# Patient Record
Sex: Female | Born: 1990 | Race: Black or African American | Hispanic: No | Marital: Single | Smoking: Former smoker
Health system: Southern US, Community
[De-identification: ages and names within clinical notes are randomized; demographics above are authoritative.]

## PROBLEM LIST (undated history)

## (undated) ENCOUNTER — Inpatient Hospital Stay: Payer: Self-pay

## (undated) DIAGNOSIS — D573 Sickle-cell trait: Secondary | ICD-10-CM

## (undated) DIAGNOSIS — G971 Other reaction to spinal and lumbar puncture: Secondary | ICD-10-CM

## (undated) DIAGNOSIS — K8020 Calculus of gallbladder without cholecystitis without obstruction: Secondary | ICD-10-CM

## (undated) DIAGNOSIS — F99 Mental disorder, not otherwise specified: Secondary | ICD-10-CM

## (undated) DIAGNOSIS — O2340 Unspecified infection of urinary tract in pregnancy, unspecified trimester: Secondary | ICD-10-CM

## (undated) DIAGNOSIS — B009 Herpesviral infection, unspecified: Secondary | ICD-10-CM

## (undated) DIAGNOSIS — A599 Trichomoniasis, unspecified: Secondary | ICD-10-CM

## (undated) HISTORY — DX: Trichomoniasis, unspecified: A59.9

## (undated) HISTORY — PX: NO PAST SURGERIES: SHX2092

## (undated) HISTORY — PX: WISDOM TOOTH EXTRACTION: SHX21

## (undated) HISTORY — PX: CHOLECYSTECTOMY: SHX55

## (undated) HISTORY — PX: OTHER SURGICAL HISTORY: SHX169

---
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
## 2011-04-01 ENCOUNTER — Emergency Department: Payer: Self-pay | Admitting: Unknown Physician Specialty

## 2011-04-01 LAB — COMPREHENSIVE METABOLIC PANEL
Albumin: 3.9 g/dL (ref 3.4–5.0)
Alkaline Phosphatase: 69 U/L (ref 50–136)
Anion Gap: 10 (ref 7–16)
BUN: 7 mg/dL (ref 7–18)
Bilirubin,Total: 0.6 mg/dL (ref 0.2–1.0)
Calcium, Total: 9 mg/dL (ref 8.5–10.1)
Creatinine: 0.57 mg/dL — ABNORMAL LOW (ref 0.60–1.30)
Glucose: 85 mg/dL (ref 65–99)
Osmolality: 280 (ref 275–301)
Potassium: 3.8 mmol/L (ref 3.5–5.1)
SGPT (ALT): 22 U/L
Sodium: 142 mmol/L (ref 136–145)
Total Protein: 7.7 g/dL (ref 6.4–8.2)

## 2011-04-01 LAB — CBC
HCT: 40.7 % (ref 35.0–47.0)
HGB: 13.8 g/dL (ref 12.0–16.0)
MCH: 27.5 pg (ref 26.0–34.0)
MCV: 81 fL (ref 80–100)
Platelet: 303 10*3/uL (ref 150–440)
RDW: 13.3 % (ref 11.5–14.5)
WBC: 5.7 10*3/uL (ref 3.6–11.0)

## 2011-04-01 LAB — URINALYSIS, COMPLETE
Blood: NEGATIVE
Nitrite: NEGATIVE
Protein: NEGATIVE
Squamous Epithelial: 3
WBC UR: 20 /HPF (ref 0–5)

## 2012-05-12 ENCOUNTER — Emergency Department: Payer: Self-pay | Admitting: Emergency Medicine

## 2012-09-23 ENCOUNTER — Emergency Department: Payer: Self-pay | Admitting: Emergency Medicine

## 2012-09-23 LAB — WET PREP, GENITAL

## 2014-07-11 ENCOUNTER — Other Ambulatory Visit: Payer: Self-pay | Admitting: Advanced Practice Midwife

## 2014-07-11 DIAGNOSIS — Z3401 Encounter for supervision of normal first pregnancy, first trimester: Secondary | ICD-10-CM

## 2014-07-11 LAB — OB RESULTS CONSOLE HIV ANTIBODY (ROUTINE TESTING): HIV: NONREACTIVE

## 2014-07-12 LAB — OB RESULTS CONSOLE GC/CHLAMYDIA
CHLAMYDIA, DNA PROBE: NEGATIVE
GC PROBE AMP, GENITAL: NEGATIVE

## 2014-07-12 LAB — OB RESULTS CONSOLE VARICELLA ZOSTER ANTIBODY, IGG: Varicella: IMMUNE

## 2014-07-12 LAB — OB RESULTS CONSOLE HEPATITIS B SURFACE ANTIGEN: Hepatitis B Surface Ag: NEGATIVE

## 2014-07-12 LAB — OB RESULTS CONSOLE ABO/RH: RH TYPE: POSITIVE

## 2014-07-12 LAB — OB RESULTS CONSOLE RPR: RPR: NONREACTIVE

## 2014-07-17 ENCOUNTER — Ambulatory Visit
Admission: RE | Admit: 2014-07-17 | Discharge: 2014-07-17 | Disposition: A | Discharge status: Home / Self Care | Payer: Medicaid Other | Source: Ambulatory Visit | Attending: Advanced Practice Midwife | Admitting: Advanced Practice Midwife

## 2014-07-17 DIAGNOSIS — Z36 Encounter for antenatal screening of mother: Secondary | ICD-10-CM | POA: Insufficient documentation

## 2014-07-17 DIAGNOSIS — Z3401 Encounter for supervision of normal first pregnancy, first trimester: Secondary | ICD-10-CM

## 2014-08-13 ENCOUNTER — Emergency Department
Admission: EM | Admit: 2014-08-13 | Discharge: 2014-08-13 | Disposition: A | Discharge status: Home / Self Care | Payer: Medicaid Other | Attending: Emergency Medicine | Admitting: Emergency Medicine

## 2014-08-13 ENCOUNTER — Encounter: Payer: Self-pay | Admitting: *Deleted

## 2014-08-13 DIAGNOSIS — M436 Torticollis: Secondary | ICD-10-CM

## 2014-08-13 DIAGNOSIS — O9989 Other specified diseases and conditions complicating pregnancy, childbirth and the puerperium: Secondary | ICD-10-CM | POA: Diagnosis not present

## 2014-08-13 DIAGNOSIS — Z3A13 13 weeks gestation of pregnancy: Secondary | ICD-10-CM | POA: Diagnosis not present

## 2014-08-13 MED ORDER — CYCLOBENZAPRINE HCL 10 MG PO TABS: 10.0000 mg | ORAL_TABLET | Freq: Three times a day (TID) | ORAL | Status: DC | PRN

## 2014-08-13 MED ORDER — ACETAMINOPHEN 500 MG PO TABS
1000.0000 mg | ORAL_TABLET | Freq: Once | ORAL | Status: AC
  Administered 2014-08-13: 1000 mg via ORAL
  Filled 2014-08-13: qty 2

## 2014-08-13 MED ORDER — CYCLOBENZAPRINE HCL 10 MG PO TABS
10.0000 mg | ORAL_TABLET | Freq: Once | ORAL | Status: AC
  Administered 2014-08-13: 10 mg via ORAL
  Filled 2014-08-13: qty 1

## 2014-08-13 NOTE — ED Provider Notes (Signed)
[facility] Emergency Department Provider Note ____________________________________________  Time seen: Approximately 5:46 PM  I have reviewed the triage vital signs and the nursing notes.   HISTORY  Chief Complaint Neck Pain   HPI Katelyn Mendoza is a 24 y.o. female who presents to the emergency department for evaluation of left-sided neck pain. She states that she slept in a bed that was partially elevated and awakened with pain in the left neck and shoulder area. She is a little over [redacted] weeks pregnant and was scared to take any current medications. She denies injury.   History reviewed. No pertinent past medical history.  There are no active problems to display for this patient.   History reviewed. No pertinent past surgical history.  Current Outpatient Rx  Name  Route  Sig  Dispense  Refill  . cyclobenzaprine (FLEXERIL) 10 MG tablet   Oral   Take 1 tablet (10 mg total) by mouth 3 (three) times daily as needed for muscle spasms.   30 tablet   0     Allergies Review of patient's allergies indicates no known allergies.  No family history on file.  Social History History  Substance Use Topics  . Smoking status: Never Smoker   . Smokeless tobacco: Not on file  . Alcohol Use: No    Review of Systems Constitutional: No recent illness. Eyes: No visual changes. ENT: No sore throat. Cardiovascular: Denies chest pain or palpitations. Respiratory: Denies shortness of breath. Gastrointestinal: No abdominal pain.  Genitourinary: Negative for dysuria. Musculoskeletal: Pain in neck, left side. Skin: Negative for rash. Neurological: Negative for headaches, focal weakness or numbness. 10-point ROS otherwise negative.  ____________________________________________   PHYSICAL EXAM:  VITAL SIGNS: ED Triage Vitals  Enc Vitals Group     BP 08/13/14 1632 104/56 mmHg     Pulse Rate 08/13/14 1632 71     Resp 08/13/14 1632 20     Temp 08/13/14  1632 97.7 F (36.5 C)     Temp Source 08/13/14 1632 Oral     SpO2 08/13/14 1632 100 %     Weight 08/13/14 1632 142 lb (64.411 kg)     Height 08/13/14 1632  (1.549 m)     Head Cir --      Peak Flow --      Pain Score 08/13/14 1634 10     Pain Loc --      Pain Edu? --      Excl. in GC? --     Constitutional: Alert and oriented. Well appearing and in no acute distress. Eyes: Conjunctivae are normal. EOMI. Head: Atraumatic. Nose: No congestion/rhinnorhea. Neck: No stridor.  Respiratory: Normal respiratory effort.   Musculoskeletal: Left neck with palpable spasm radiating into the left shoulder. Neurologic:  Normal speech and language. No gross focal neurologic deficits are appreciated. Speech is normal. No gait instability. Skin:  Skin is warm, dry and intact. Atraumatic. Psychiatric: Mood and affect are normal. Speech and behavior are normal.  ____________________________________________   LABS (all labs ordered are listed, but only abnormal results are displayed)  Labs Reviewed - No data to display ____________________________________________  RADIOLOGY  Not indicated ____________________________________________   PROCEDURES  Procedure(s) performed: None   ____________________________________________   INITIAL IMPRESSION / ASSESSMENT AND PLAN / ED COURSE  Pertinent labs & imaging results that were available during my care of the patient were [facility] (see chart for details).  Patient was advised to  take the Flexeril as needed. She was advised not to take NSAIDs, but she was advised that she could take Tylenol as needed for pain. She was advised to follow-up with her OB/GYN for symptoms that are not improving over the next 2-3 days. ____________________________________________   FINAL CLINICAL IMPRESSION(S) / ED DIAGNOSES  Final diagnoses:  Torticollis, acute      Chinita Pester, FNP 08/13/14  1748  Governor Rooks, MD 08/13/14 2201

## 2014-08-13 NOTE — ED Notes (Signed)
Pt reports a sharp pain in her left neck and back that started yesterday morning when she woke up. Pain worse with trying to look to the side, no meds prior to arrival.

## 2014-08-13 NOTE — ED Notes (Signed)
States she woke up with pain to neck this am  Denies any injury  Provider in room on arrival

## 2014-10-13 ENCOUNTER — Encounter (HOSPITAL_COMMUNITY): Payer: Self-pay | Admitting: Emergency Medicine

## 2014-10-13 ENCOUNTER — Emergency Department (HOSPITAL_COMMUNITY)
Admission: EM | Admit: 2014-10-13 | Discharge: 2014-10-13 | Disposition: A | Discharge status: Home / Self Care | Payer: Medicaid Other | Attending: Emergency Medicine | Admitting: Emergency Medicine

## 2014-10-13 DIAGNOSIS — R1110 Vomiting, unspecified: Secondary | ICD-10-CM | POA: Diagnosis not present

## 2014-10-13 DIAGNOSIS — N390 Urinary tract infection, site not specified: Secondary | ICD-10-CM

## 2014-10-13 DIAGNOSIS — Z8659 Personal history of other mental and behavioral disorders: Secondary | ICD-10-CM | POA: Diagnosis not present

## 2014-10-13 DIAGNOSIS — Z862 Personal history of diseases of the blood and blood-forming organs and certain disorders involving the immune mechanism: Secondary | ICD-10-CM | POA: Diagnosis not present

## 2014-10-13 DIAGNOSIS — R197 Diarrhea, unspecified: Secondary | ICD-10-CM | POA: Insufficient documentation

## 2014-10-13 DIAGNOSIS — Z3490 Encounter for supervision of normal pregnancy, unspecified, unspecified trimester: Secondary | ICD-10-CM

## 2014-10-13 DIAGNOSIS — O9989 Other specified diseases and conditions complicating pregnancy, childbirth and the puerperium: Secondary | ICD-10-CM | POA: Diagnosis not present

## 2014-10-13 DIAGNOSIS — O2392 Unspecified genitourinary tract infection in pregnancy, second trimester: Secondary | ICD-10-CM | POA: Diagnosis not present

## 2014-10-13 HISTORY — DX: Sickle-cell trait: D57.3

## 2014-10-13 HISTORY — DX: Mental disorder, not otherwise specified: F99

## 2014-10-13 LAB — URINE MICROSCOPIC-ADD ON

## 2014-10-13 LAB — URINALYSIS, ROUTINE W REFLEX MICROSCOPIC
BILIRUBIN URINE: NEGATIVE
Glucose, UA: NEGATIVE mg/dL
KETONES UR: 15 mg/dL — AB
NITRITE: POSITIVE — AB
PH: 6 (ref 5.0–8.0)
PROTEIN: 30 mg/dL — AB
Specific Gravity, Urine: 1.012 (ref 1.005–1.030)
Urobilinogen, UA: 1 mg/dL (ref 0.0–1.0)

## 2014-10-13 LAB — CBC
HCT: 32.4 % — ABNORMAL LOW (ref 36.0–46.0)
Hemoglobin: 11 g/dL — ABNORMAL LOW (ref 12.0–15.0)
MCH: 26.8 pg (ref 26.0–34.0)
MCHC: 34 g/dL (ref 30.0–36.0)
MCV: 79 fL (ref 78.0–100.0)
PLATELETS: 254 10*3/uL (ref 150–400)
RBC: 4.1 MIL/uL (ref 3.87–5.11)
RDW: 13.3 % (ref 11.5–15.5)
WBC: 14.2 10*3/uL — AB (ref 4.0–10.5)

## 2014-10-13 LAB — WET PREP, GENITAL
Clue Cells Wet Prep HPF POC: NONE SEEN
TRICH WET PREP: NONE SEEN
Yeast Wet Prep HPF POC: NONE SEEN

## 2014-10-13 LAB — COMPREHENSIVE METABOLIC PANEL
ALT: 11 U/L — AB (ref 14–54)
AST: 16 U/L (ref 15–41)
Albumin: 2.8 g/dL — ABNORMAL LOW (ref 3.5–5.0)
Alkaline Phosphatase: 73 U/L (ref 38–126)
Anion gap: 9 (ref 5–15)
BUN: <5 mg/dL — ABNORMAL LOW (ref 6–20)
CHLORIDE: 101 mmol/L (ref 101–111)
CO2: 21 mmol/L — ABNORMAL LOW (ref 22–32)
CREATININE: 0.63 mg/dL (ref 0.44–1.00)
Calcium: 8.6 mg/dL — ABNORMAL LOW (ref 8.9–10.3)
GFR calc non Af Amer: >60 mL/min (ref >60)
Glucose, Bld: 98 mg/dL (ref 65–99)
Potassium: 3.6 mmol/L (ref 3.5–5.1)
Sodium: 131 mmol/L — ABNORMAL LOW (ref 135–145)
Total Bilirubin: 0.9 mg/dL (ref 0.3–1.2)
Total Protein: 6.4 g/dL — ABNORMAL LOW (ref 6.5–8.1)

## 2014-10-13 LAB — LIPASE, BLOOD: LIPASE: 17 U/L — AB (ref 22–51)

## 2014-10-13 MED ORDER — CEPHALEXIN 500 MG PO CAPS: 500.0000 mg | ORAL_CAPSULE | Freq: Three times a day (TID) | ORAL | Status: DC

## 2014-10-13 MED ORDER — SODIUM CHLORIDE 0.9 % IV BOLUS (SEPSIS)
1000.0000 mL | Freq: Once | INTRAVENOUS | Status: AC
  Administered 2014-10-13: 1000 mL via INTRAVENOUS

## 2014-10-13 MED ORDER — DEXTROSE 5 % IV SOLN
1.0000 g | Freq: Once | INTRAVENOUS | Status: AC
  Administered 2014-10-13: 1 g via INTRAVENOUS
  Filled 2014-10-13: qty 10

## 2014-10-13 MED ORDER — ONDANSETRON 4 MG PO TBDP: ORAL_TABLET | ORAL | Status: DC

## 2014-10-13 NOTE — Progress Notes (Signed)
Dr Shawnie Pons called and made aware of patient complaints, gestation, and fetal heart rate tracing (no ctx noted and reactive fetal heart rate); orders given to remove from monitor and clear obstetrically.

## 2014-10-13 NOTE — Progress Notes (Signed)
RROB called from [facility] for a pregnant patient coming in to be seen for complaints of nausea and vomiting x1 week but getting worse over the last 2 days; patient also states she has a low grade fever and some cramping pain in her lower back and right side and abdomen that remains constant and worsens with activity; patient rates pain 10/10 at this time; she is a G2P0 that is 25 and 3/[redacted] weeks along in her pregnancy at this time; patient states she plans to delivery at [facility]. She states positive fetal movement and denies bleeding or leaking of fluid. She does report some mucus white discharge at this time. EFM in place at this time. Patient is receiving fluid bolus and labs are in process.

## 2014-10-13 NOTE — ED Provider Notes (Signed)
CSN: [id number]     Arrival date & time 10/13/14  0127 History   First MD Initiated Contact with Patient 10/13/14 0410     Chief Complaint  Patient presents with  . Diarrhea  . Emesis     (Consider location/radiation/quality/duration/timing/severity/associated sxs/prior Treatment) HPI Patient presents with several days of vomiting and diarrhea. States she's had 3 episodes of each daily. No blood in either. Patient also complains of urinary frequency. Denies any flank pain. Patient denies pelvic pain, vaginal bleeding. She has had mild white vaginal discharge. Patient states that she is 5/2 months pregnant. Past Medical History  Diagnosis Date  . Sickle cell trait (HCC)   . Mental disorder     bipolar disorder dx'd at age 69   Past Surgical History  Procedure Laterality Date  . No past surgeries     No family history on file. Social History  Substance Use Topics  . Smoking status: Never Smoker   . Smokeless tobacco: None  . Alcohol Use: No   OB History    Gravida Para Term Preterm AB TAB SAB Ectopic Multiple Living   1              Review of Systems  Constitutional: Positive for fever and fatigue. Negative for chills.  Respiratory: Negative for cough and shortness of breath.   Cardiovascular: Negative for chest pain.  Gastrointestinal: Positive for nausea, vomiting and diarrhea. Negative for abdominal pain and blood in stool.  Genitourinary: Positive for frequency. Negative for dysuria, flank pain, vaginal bleeding, vaginal discharge and pelvic pain.  Musculoskeletal: Positive for myalgias and back pain. Negative for neck pain and neck stiffness.  Skin: Negative for rash and wound.  Neurological: Negative for dizziness, weakness, light-headedness, numbness and headaches.  All other systems reviewed and are negative.     Allergies  Review of patient's allergies indicates no known allergies.  Home Medications   Prior to Admission medications   Medication Sig Start  Date End Date Taking? Authorizing Provider  Prenatal Vit-Fe Fumarate-FA (PRENATAL MULTIVITAMIN) TABS tablet Take 1 tablet by mouth daily at 12 noon.   Yes Historical Provider, MD  cephALEXin (KEFLEX) 500 MG capsule Take 1 capsule (500 mg total) by mouth 3 (three) times daily. 10/13/14   Loren Racer, MD  ondansetron (ZOFRAN ODT) 4 MG disintegrating tablet  ODT q4 hours prn nausea/vomit 10/13/14   Loren Racer, MD   BP 90/42 mmHg  Pulse 94  Temp(Src) 99 F (37.2 C) (Oral)  Resp 26  Wt 151 lb 11.2 oz (68.811 kg)  SpO2 98%  LMP 05/08/2014 (Exact Date) Physical Exam  Constitutional: She is oriented to person, place, and time. She appears well-developed and well-nourished. No distress.  HENT:  Head: Normocephalic and atraumatic.  Mouth/Throat: Oropharynx is clear and moist. No oropharyngeal exudate.  Eyes: EOM are normal. Pupils are equal, round, and reactive to light.  Neck: Normal range of motion. Neck supple.  No meningismus  Cardiovascular: Normal rate and regular rhythm.   Pulmonary/Chest: Effort normal and breath sounds normal. No respiratory distress. She has no wheezes. She has no rales. She exhibits no tenderness.  Abdominal: Soft. Bowel sounds are normal. She exhibits no distension and no mass. There is no tenderness. There is no rebound and no guarding.  Gravid abdomen with fundus at roughly the umbilicus.  Genitourinary: Vaginal discharge (white vaginal discharge) found.  No cervical motion tenderness. No fundal or adnexal tenderness.  Musculoskeletal: Normal range of motion. She exhibits no edema or tenderness.  No CVA tenderness bilaterally. Mild inferior lumbar paraspinal tenderness. No midline thoracic or lumbar tenderness.  Neurological: She is alert and oriented to person, place, and time.  Moves all extremities without deficit. Sensation is grossly intact.  Skin: Skin is warm and dry. No rash noted. No erythema.  Psychiatric: She has a normal mood and affect. Her  behavior is normal.  Nursing note and vitals reviewed.   ED Course  Procedures (including critical care time) Labs Review Labs Reviewed  WET PREP, GENITAL - Abnormal; Notable for the following:    WBC, Wet Prep HPF POC FEW (*)    All other components within normal limits  LIPASE, BLOOD - Abnormal; Notable for the following:    Lipase 17 (*)    All other components within normal limits  COMPREHENSIVE METABOLIC PANEL - Abnormal; Notable for the following:    Sodium 131 (*)    CO2 21 (*)    BUN <5 (*)    Calcium 8.6 (*)    Total Protein 6.4 (*)    Albumin 2.8 (*)    ALT 11 (*)    All other components within normal limits  CBC - Abnormal; Notable for the following:    WBC 14.2 (*)    Hemoglobin 11.0 (*)    HCT 32.4 (*)    All other components within normal limits  URINALYSIS, ROUTINE W REFLEX MICROSCOPIC (NOT AT [facility]) - Abnormal; Notable for the following:    Color, Urine AMBER (*)    APPearance TURBID (*)    Hgb urine dipstick SMALL (*)    Ketones, ur 15 (*)    Protein, ur 30 (*)    Nitrite POSITIVE (*)    Leukocytes, UA LARGE (*)    All other components within normal limits  URINE MICROSCOPIC-ADD ON - Abnormal; Notable for the following:    Squamous Epithelial / LPF FEW (*)    Bacteria, UA MANY (*)    All other components within normal limits  GC/CHLAMYDIA PROBE AMP ([place]) NOT AT [facility]    Imaging Review No results found. I have personally reviewed and evaluated these images and lab results as part of my medical decision-making.   EKG Interpretation None      MDM   Final diagnoses:  Vomiting and diarrhea  UTI (lower urinary tract infection)  Pregnancy    These monitored in the emergency department. Cleared by OB/GYN. Patient with urinary tract infection. Given IV fluids and IV Rocephin. No further vomiting in the emergency department. Discharge home with antiemetic and antibiotic. Advised to follow-up with her OB/GYN. Return precautions  given.    Loren Racer, MD 10/13/14 [phone]

## 2014-10-13 NOTE — Discharge Instructions (Signed)
Nausea and Vomiting Nausea is a sick feeling that often comes before throwing up (vomiting). Vomiting is a reflex where stomach contents come out of your mouth. Vomiting can cause severe loss of body fluids (dehydration). Children and elderly adults can become dehydrated quickly, especially if they also have diarrhea. Nausea and vomiting are symptoms of a condition or disease. It is important to find the cause of your symptoms. CAUSES   Direct irritation of the stomach lining. This irritation can result from increased acid production (gastroesophageal reflux disease), infection, food poisoning, taking certain medicines (such as nonsteroidal anti-inflammatory drugs), alcohol use, or tobacco use.  Signals from the brain.These signals could be caused by a headache, heat exposure, an inner ear disturbance, increased pressure in the brain from injury, infection, a tumor, or a concussion, pain, emotional stimulus, or metabolic problems.  An obstruction in the gastrointestinal tract (bowel obstruction).  Illnesses such as diabetes, hepatitis, gallbladder problems, appendicitis, kidney problems, cancer, sepsis, atypical symptoms of a heart attack, or eating disorders.  Medical treatments such as chemotherapy and radiation.  Receiving medicine that makes you sleep (general anesthetic) during surgery. DIAGNOSIS Your caregiver may ask for tests to be done if the problems do not improve after a few days. Tests may also be done if symptoms are severe or if the reason for the nausea and vomiting is not clear. Tests may include:  Urine tests.  Blood tests.  Stool tests.  Cultures (to look for evidence of infection).  X-rays or other imaging studies. Test results can help your caregiver make decisions about treatment or the need for additional tests. TREATMENT You need to stay well hydrated. Drink frequently but in small amounts.You may wish to drink water, sports drinks, clear broth, or eat frozen  ice pops or gelatin dessert to help stay hydrated.When you eat, eating slowly may help prevent nausea.There are also some antinausea medicines that may help prevent nausea. HOME CARE INSTRUCTIONS   Take all medicine as directed by your caregiver.  If you do not have an appetite, do not force yourself to eat. However, you must continue to drink fluids.  If you have an appetite, eat a normal diet unless your caregiver tells you differently.  Eat a variety of complex carbohydrates (rice, wheat, potatoes, bread), lean meats, yogurt, fruits, and vegetables.  Avoid high-fat foods because they are more difficult to digest.  Drink enough water and fluids to keep your urine clear or pale yellow.  If you are dehydrated, ask your caregiver for specific rehydration instructions. Signs of dehydration may include:  Severe thirst.  Dry lips and mouth.  Dizziness.  Dark urine.  Decreasing urine frequency and amount.  Confusion.  Rapid breathing or pulse. SEEK IMMEDIATE MEDICAL CARE IF:   You have blood or brown flecks (like coffee grounds) in your vomit.  You have black or bloody stools.  You have a severe headache or stiff neck.  You are confused.  You have severe abdominal pain.  You have chest pain or trouble breathing.  You do not urinate at least once every 8 hours.  You develop cold or clammy skin.  You continue to vomit for longer than 24 to 48 hours.  You have a fever. MAKE SURE YOU:   Understand these instructions.  Will watch your condition.  Will get help right away if you are not doing well or get worse. Document Released: 12/29/2004 Document Revised: 03/23/2011 Document Reviewed: 05/28/2010 ExitCare Patient Information 2015 [employer]. This information is not intended   to replace advice given to you by your health care provider. Make sure you discuss any questions you have with your health care provider.  Pregnancy and Urinary Tract Infection A urinary  tract infection (UTI) is a bacterial infection of the urinary tract. Infection of the urinary tract can include the ureters, kidneys (pyelonephritis), bladder (cystitis), and urethra (urethritis). All pregnant women should be screened for bacteria in the urinary tract. Identifying and treating a UTI will decrease the risk of preterm labor and developing more serious infections in both the mother and baby. CAUSES Bacteria germs cause almost all UTIs.  RISK FACTORS Many factors can increase your chances of getting a UTI during pregnancy. These include:  Having a short urethra.  Poor toilet and hygiene habits.  Sexual intercourse.  Blockage of urine along the urinary tract.  Problems with the pelvic muscles or nerves.  Diabetes.  Obesity.  Bladder problems after having several children.  Previous history of UTI. SIGNS AND SYMPTOMS   Pain, burning, or a stinging feeling when urinating.  Suddenly feeling the need to urinate right away (urgency).  Loss of bladder control (urinary incontinence).  Frequent urination, more than is common with pregnancy.  Lower abdominal or back discomfort.  Cloudy urine.  Blood in the urine (hematuria).  Fever. When the kidneys are infected, the symptoms may be:  Back pain.  Flank pain on the right side more so than the left.  Fever.  Chills.  Nausea.  Vomiting. DIAGNOSIS  A urinary tract infection is usually diagnosed through urine tests. Additional tests and procedures are sometimes done. These may include:  Ultrasound exam of the kidneys, ureters, bladder, and urethra.  Looking in the bladder with a lighted tube (cystoscopy). TREATMENT Typically, UTIs can be treated with antibiotic medicines.  HOME CARE INSTRUCTIONS   Only take over-the-counter or prescription medicines as directed by your health care provider. If you were prescribed antibiotics, take them as directed. Finish them even if you start to feel better.  Drink  enough fluids to keep your urine clear or pale yellow.  Do not have sexual intercourse until the infection is gone and your health care provider says it is okay.  Make sure you are tested for UTIs throughout your pregnancy. These infections often come back. Preventing a UTI in the Future  Practice good toilet habits. Always wipe from front to back. Use the tissue only once.  Do not hold your urine. Empty your bladder as soon as possible when the urge comes.  Do not douche or use deodorant sprays.  Wash with soap and warm water around the genital area and the anus.  Empty your bladder before and after sexual intercourse.  Wear underwear with a cotton crotch.  Avoid caffeine and carbonated drinks. They can irritate the bladder.  Drink cranberry juice or take cranberry pills. This may decrease the risk of getting a UTI.  Do not drink alcohol.  Keep all your appointments and tests as scheduled. SEEK MEDICAL CARE IF:   Your symptoms get worse.  You are still having fevers 2 or more days after treatment begins.  You have a rash.  You feel that you are having problems with medicines prescribed.  You have abnormal vaginal discharge. SEEK IMMEDIATE MEDICAL CARE IF:   You have back or flank pain.  You have chills.  You have blood in your urine.  You have nausea and vomiting.  You have contractions of your uterus.  You have a gush of fluid from the  MAKE SURE YOU: °· Understand these instructions.   °· Will watch your condition.   °· Will get help right away if you are not doing well or get worse.   °Document Released: 04/25/2010 Document Revised: 10/19/2012 Document Reviewed: 07/28/2012 °ExitCare® Patient Information ©2015 [employer]. This information is not intended to replace advice given to you by your health care provider. Make sure you discuss any questions you have with your health care provider. ° °

## 2014-10-13 NOTE — ED Notes (Signed)
Pt. reports emesis , diarrhea , low grade fever and chills onset last week , she's 5 1/2 moths pregnant , denies vaginal bleeding or dysuria .

## 2014-10-15 LAB — GC/CHLAMYDIA PROBE AMP (~~LOC~~) NOT AT ARMC
Chlamydia: NEGATIVE
Neisseria Gonorrhea: NEGATIVE

## 2014-11-24 ENCOUNTER — Encounter: Payer: Self-pay | Admitting: *Deleted

## 2014-11-24 ENCOUNTER — Observation Stay
Admission: EM | Admit: 2014-11-24 | Discharge: 2014-11-24 | Disposition: A | Discharge status: Home / Self Care | Payer: Medicaid Other | Attending: Obstetrics & Gynecology | Admitting: Obstetrics & Gynecology

## 2014-11-24 DIAGNOSIS — Z3A29 29 weeks gestation of pregnancy: Secondary | ICD-10-CM | POA: Diagnosis not present

## 2014-11-24 DIAGNOSIS — O26893 Other specified pregnancy related conditions, third trimester: Secondary | ICD-10-CM | POA: Diagnosis not present

## 2014-11-24 DIAGNOSIS — N898 Other specified noninflammatory disorders of vagina: Secondary | ICD-10-CM | POA: Insufficient documentation

## 2014-11-24 DIAGNOSIS — L292 Pruritus vulvae: Secondary | ICD-10-CM | POA: Diagnosis present

## 2014-11-24 MED ORDER — CLOTRIMAZOLE 1 % EX CREA
1.0000 | TOPICAL_CREAM | Freq: Two times a day (BID) | CUTANEOUS | Status: DC
Start: 2014-11-24 — End: 2014-12-09

## 2014-11-24 MED ORDER — CLOTRIMAZOLE 1 % VA CREA: 1.0000 | TOPICAL_CREAM | Freq: Every day | VAGINAL | Status: DC

## 2014-11-24 NOTE — OB Triage Note (Signed)
Reports having BV about a month ago.Elaina HoopsElks, Taran Haynesworth S

## 2014-11-24 NOTE — Discharge Summary (Signed)
Katelyn Mendoza is a 24 y.o. female. She is at [id number] gestation.  Indication: vaginal discharge and itching  S: Resting comfortably. no CTX, no VB. Active fetal movement. Concerned about external vaginal discomfort O:  BP 110/66 mmHg  Pulse 87  Temp(Src) 98.4 F (36.9 C) (Oral)  Resp 20  Ht 5\' 1"  (1.549 m)  Wt 68.04 kg (150 lb)  BMI 28.36 kg/m2     Gen: NAD, AAOx3      Abd: FNTTP      Ext: Non-tender, Nonedmeatous    FHT: 145 mod no accels no decels TOCO: quiet SVE: deferred.  SSE: no pooling, nitrazine equivocal, curdy white discharge.   A/P:  23yo G1P0 with vaginal discharge, likely yeast, causing irritation   Labor: not present.   R/o ROM: SSE negative x 3. Wet prep sent. Normal amount of white discharge, curdy. Gave Rx for Clotrimazole 7day tx vaginally, and clotrimazole topical for vulvar treatment.  Fetal Wellbeing: Reassuring Cat 1 tracing.  D/c home stable, precautions reviewed, follow-up as scheduled.   Caley Volkert, Elenora Fenderhelsea C

## 2014-11-27 LAB — HSV CULTURE AND TYPING

## 2014-12-09 ENCOUNTER — Emergency Department (HOSPITAL_COMMUNITY): Payer: Medicaid Other

## 2014-12-09 ENCOUNTER — Emergency Department (HOSPITAL_COMMUNITY)
Admission: EM | Admit: 2014-12-09 | Discharge: 2014-12-09 | Disposition: A | Discharge status: Home / Self Care | Payer: Medicaid Other | Attending: Emergency Medicine | Admitting: Emergency Medicine

## 2014-12-09 ENCOUNTER — Encounter (HOSPITAL_COMMUNITY): Payer: Self-pay | Admitting: *Deleted

## 2014-12-09 DIAGNOSIS — O99113 Other diseases of the blood and blood-forming organs and certain disorders involving the immune mechanism complicating pregnancy, third trimester: Secondary | ICD-10-CM | POA: Insufficient documentation

## 2014-12-09 DIAGNOSIS — K8020 Calculus of gallbladder without cholecystitis without obstruction: Secondary | ICD-10-CM | POA: Insufficient documentation

## 2014-12-09 DIAGNOSIS — D573 Sickle-cell trait: Secondary | ICD-10-CM | POA: Diagnosis not present

## 2014-12-09 DIAGNOSIS — K8050 Calculus of bile duct without cholangitis or cholecystitis without obstruction: Secondary | ICD-10-CM

## 2014-12-09 DIAGNOSIS — O99613 Diseases of the digestive system complicating pregnancy, third trimester: Secondary | ICD-10-CM | POA: Diagnosis not present

## 2014-12-09 DIAGNOSIS — Z79899 Other long term (current) drug therapy: Secondary | ICD-10-CM | POA: Insufficient documentation

## 2014-12-09 DIAGNOSIS — Z3A32 32 weeks gestation of pregnancy: Secondary | ICD-10-CM | POA: Diagnosis not present

## 2014-12-09 DIAGNOSIS — O2393 Unspecified genitourinary tract infection in pregnancy, third trimester: Secondary | ICD-10-CM | POA: Diagnosis not present

## 2014-12-09 DIAGNOSIS — O9989 Other specified diseases and conditions complicating pregnancy, childbirth and the puerperium: Secondary | ICD-10-CM | POA: Diagnosis present

## 2014-12-09 DIAGNOSIS — N390 Urinary tract infection, site not specified: Secondary | ICD-10-CM

## 2014-12-09 LAB — RAPID URINE DRUG SCREEN, HOSP PERFORMED
AMPHETAMINES: NOT DETECTED
BARBITURATES: NOT DETECTED
BENZODIAZEPINES: NOT DETECTED
COCAINE: NOT DETECTED
Opiates: NOT DETECTED
Tetrahydrocannabinol: NOT DETECTED

## 2014-12-09 LAB — CBC WITH DIFFERENTIAL/PLATELET
BASOS ABS: 0 10*3/uL (ref 0.0–0.1)
BASOS PCT: 0 %
EOS ABS: 0.1 10*3/uL (ref 0.0–0.7)
EOS PCT: 1 %
HCT: 34.9 % — ABNORMAL LOW (ref 36.0–46.0)
Hemoglobin: 11.4 g/dL — ABNORMAL LOW (ref 12.0–15.0)
Lymphocytes Relative: 27 %
Lymphs Abs: 2.5 10*3/uL (ref 0.7–4.0)
MCH: 25.2 pg — ABNORMAL LOW (ref 26.0–34.0)
MCHC: 32.7 g/dL (ref 30.0–36.0)
MCV: 77 fL — ABNORMAL LOW (ref 78.0–100.0)
MONO ABS: 0.8 10*3/uL (ref 0.1–1.0)
MONOS PCT: 9 %
Neutro Abs: 6.1 10*3/uL (ref 1.7–7.7)
Neutrophils Relative %: 63 %
PLATELETS: 254 10*3/uL (ref 150–400)
RBC: 4.53 MIL/uL (ref 3.87–5.11)
RDW: 14.4 % (ref 11.5–15.5)
WBC: 9.5 10*3/uL (ref 4.0–10.5)

## 2014-12-09 LAB — URINE MICROSCOPIC-ADD ON

## 2014-12-09 LAB — COMPREHENSIVE METABOLIC PANEL
ALBUMIN: 2.9 g/dL — AB (ref 3.5–5.0)
ALT: 12 U/L — ABNORMAL LOW (ref 14–54)
ANION GAP: 9 (ref 5–15)
AST: 16 U/L (ref 15–41)
Alkaline Phosphatase: 94 U/L (ref 38–126)
BILIRUBIN TOTAL: 0.1 mg/dL — AB (ref 0.3–1.2)
BUN: <5 mg/dL — ABNORMAL LOW (ref 6–20)
CHLORIDE: 106 mmol/L (ref 101–111)
CO2: 21 mmol/L — AB (ref 22–32)
Calcium: 9 mg/dL (ref 8.9–10.3)
Creatinine, Ser: 0.55 mg/dL (ref 0.44–1.00)
GFR calc Af Amer: >60 mL/min (ref >60)
GFR calc non Af Amer: >60 mL/min (ref >60)
GLUCOSE: 78 mg/dL (ref 65–99)
POTASSIUM: 3.5 mmol/L (ref 3.5–5.1)
SODIUM: 136 mmol/L (ref 135–145)
TOTAL PROTEIN: 6.1 g/dL — AB (ref 6.5–8.1)

## 2014-12-09 LAB — URINALYSIS, ROUTINE W REFLEX MICROSCOPIC
Bilirubin Urine: NEGATIVE
Glucose, UA: NEGATIVE mg/dL
Hgb urine dipstick: NEGATIVE
KETONES UR: NEGATIVE mg/dL
NITRITE: POSITIVE — AB
PH: 6 (ref 5.0–8.0)
Protein, ur: NEGATIVE mg/dL
Specific Gravity, Urine: 1.016 (ref 1.005–1.030)

## 2014-12-09 LAB — LIPASE, BLOOD: Lipase: 29 U/L (ref 11–51)

## 2014-12-09 LAB — ETHANOL: Alcohol, Ethyl (B): <5 mg/dL (ref <5)

## 2014-12-09 MED ORDER — CEPHALEXIN 500 MG PO CAPS
500.0000 mg | ORAL_CAPSULE | Freq: Two times a day (BID) | ORAL | Status: DC
Start: 2014-12-09 — End: 2015-02-18

## 2014-12-09 MED ORDER — OXYCODONE-ACETAMINOPHEN 5-325 MG PO TABS
2.0000 | ORAL_TABLET | Freq: Once | ORAL | Status: AC
  Administered 2014-12-09: 2 via ORAL
  Filled 2014-12-09: qty 2

## 2014-12-09 MED ORDER — OXYCODONE-ACETAMINOPHEN 5-325 MG PO TABS: 1.0000 | ORAL_TABLET | Freq: Three times a day (TID) | ORAL | Status: DC | PRN

## 2014-12-09 MED ORDER — METOCLOPRAMIDE HCL 5 MG/ML IJ SOLN
10.0000 mg | INTRAMUSCULAR | Status: AC
  Administered 2014-12-09: 10 mg via INTRAVENOUS
  Filled 2014-12-09: qty 2

## 2014-12-09 MED ORDER — CEFTRIAXONE SODIUM 1 G IJ SOLR
1.0000 g | Freq: Once | INTRAMUSCULAR | Status: AC
  Administered 2014-12-09: 1 g via INTRAVENOUS
  Filled 2014-12-09: qty 10

## 2014-12-09 MED ORDER — FENTANYL CITRATE (PF) 100 MCG/2ML IJ SOLN
50.0000 ug | Freq: Once | INTRAMUSCULAR | Status: AC
  Administered 2014-12-09: 50 ug via INTRAVENOUS
  Filled 2014-12-09: qty 2

## 2014-12-09 MED ORDER — MORPHINE SULFATE (PF) 4 MG/ML IV SOLN: 4.0000 mg | Freq: Once | INTRAVENOUS | Status: DC

## 2014-12-09 MED ORDER — METOCLOPRAMIDE HCL 10 MG PO TABS: 10.0000 mg | ORAL_TABLET | Freq: Four times a day (QID) | ORAL | Status: DC | PRN

## 2014-12-09 MED ORDER — SODIUM CHLORIDE 0.9 % IV BOLUS (SEPSIS)
1000.0000 mL | Freq: Once | INTRAVENOUS | Status: AC
  Administered 2014-12-09: 1000 mL via INTRAVENOUS

## 2014-12-09 NOTE — ED Notes (Signed)
Patient unable to sit still rocking back and forth.  C/o lower back pain and lower abd pain.  +N/V

## 2014-12-09 NOTE — ED Notes (Signed)
This nurse called the secretary on POD B to call the OB rapid response nurse for this patient

## 2014-12-09 NOTE — ED Notes (Signed)
Patient presents with c/o abd pain sine 1035pm.  Denies water breaking.

## 2014-12-09 NOTE — Discharge Instructions (Signed)
Take Keflex as prescribed for urinary tract infection. Your ultrasound showed that you have gallstones. Recommend that you do not eat fried, fatty, or greasy foods to try and prevent worsening symptoms. Take Reglan as needed for nausea and vomiting and Percocet as needed for severe pain. Do not take additional Tylenol if you are taking Percocet as there is already Tylenol in this medication. Follow-up with your OB/GYN regarding your symptoms today. Return to [facility] if symptoms worsen.  Biliary Colic Biliary colic is a pain in the upper abdomen. The pain:  Is usually felt on the right side of the abdomen, but it may also be felt in the center of the abdomen, just below the breastbone (sternum).  May spread back toward the right shoulder blade.  May be steady or irregular.  May be accompanied by nausea and vomiting. Most of the time, the pain goes away in 1-5 hours. After the most intense pain passes, the abdomen may continue to ache mildly for about 24 hours. Biliary colic is caused by a blockage in the bile duct. The bile duct is a pathway that carries bile--a liquid that helps to digest fats--from the gallbladder to the small intestine. Biliary colic usually occurs after eating, when the digestive system demands bile. The pain develops when muscle cells contract forcefully to try to move the blockage so that bile can get by. HOME CARE INSTRUCTIONS  Take medicines only as directed by your health care provider.  Drink enough fluid to keep your urine clear or pale yellow.  Avoid fatty, greasy, and fried foods. These kinds of foods increase your body's demand for bile.  Avoid any foods that make your pain worse.  Avoid overeating.  Avoid having a large meal after fasting. SEEK MEDICAL CARE IF:  You develop a fever.  Your pain gets worse.  You vomit.  You develop nausea that prevents you from eating and drinking. SEEK IMMEDIATE MEDICAL CARE IF:  You suddenly develop a  fever and shaking chills.  You develop a yellowish discoloration (jaundice) of:  Skin.  Whites of the eyes.  Mucous membranes.  You have continuous or severe pain that is not relieved with medicines.  You have nausea and vomiting that is not relieved with medicines.  You develop dizziness or you faint.   This information is not intended to replace advice given to you by your health care provider. Make sure you discuss any questions you have with your health care provider.   Document Released: 06/01/2005 Document Revised: 05/15/2014 Document Reviewed: 10/10/2013 Elsevier Interactive Patient Education 2016 Elsevier Inc. Low-Fat Diet for Pancreatitis or Gallbladder Conditions A low-fat diet can be helpful if you have pancreatitis or a gallbladder condition. With these conditions, your pancreas and gallbladder have trouble digesting fats. A healthy eating plan with less fat will help rest your pancreas and gallbladder and reduce your symptoms. WHAT DO I NEED TO KNOW ABOUT THIS DIET?  Eat a low-fat diet.  Reduce your fat intake to less than 20-30% of your total daily calories. This is less than 50-60 g of fat per day.  Remember that you need some fat in your diet. Ask your dietician what your daily goal should be.  Choose nonfat and low-fat healthy foods. Look for the words "nonfat," "low fat," or "fat free."  As a guide, look on the label and choose foods with less than 3 g of fat per serving. Eat only one serving.  Avoid alcohol.  Do not smoke. If you need  help quitting, talk with your health care provider.  Eat small frequent meals instead of three large heavy meals. WHAT FOODS CAN I EAT? Grains Include healthy grains and starches such as potatoes, wheat bread, fiber-rich cereal, and brown rice. Choose whole grain options whenever possible. In adults, whole grains should account for 45-65% of your daily calories.  Fruits and Vegetables Eat plenty of fruits and vegetables.  Fresh fruits and vegetables add fiber to your diet. Meats and Other Protein Sources Eat lean meat such as chicken and pork. Trim any fat off of meat before cooking it. Eggs, fish, and beans are other sources of protein. In adults, these foods should account for 10-35% of your daily calories. Dairy Choose low-fat milk and dairy options. Dairy includes fat and protein, as well as calcium.  Fats and Oils Limit high-fat foods such as fried foods, sweets, baked goods, sugary drinks.  Other Creamy sauces and condiments, such as mayonnaise, can add extra fat. Think about whether or not you need to use them, or use smaller amounts or low fat options. WHAT FOODS ARE NOT RECOMMENDED?  High fat foods, such as:  [employer]aked goods.  Ice cream.  [place] toast.  Sweet rolls.  Pizza.  Cheese bread.  Foods covered with batter, butter, creamy sauces, or cheese.  Fried foods.  Sugary drinks and desserts.  Foods that cause gas or bloating   This information is not intended to replace advice given to you by your health care provider. Make sure you discuss any questions you have with your health care provider.   Document Released: 01/03/2013 Document Reviewed: 01/03/2013 Elsevier Interactive Patient Education 2016 Elsevier Inc. Urinary Tract Infection Urinary tract infections (UTIs) can develop anywhere along your urinary tract. Your urinary tract is your body's drainage system for removing wastes and extra water. Your urinary tract includes two kidneys, two ureters, a bladder, and a urethra. Your kidneys are a pair of bean-shaped organs. Each kidney is about the size of your fist. They are located below your ribs, one on each side of your spine. CAUSES Infections are caused by microbes, which are microscopic organisms, including fungi, viruses, and bacteria. These organisms are so small that they can only be seen through a microscope. Bacteria are the microbes that most commonly cause UTIs. SYMPTOMS    Symptoms of UTIs may vary by age and gender of the patient and by the location of the infection. Symptoms in young women typically include a frequent and intense urge to urinate and a painful, burning feeling in the bladder or urethra during urination. Older women and men are more likely to be tired, shaky, and weak and have muscle aches and abdominal pain. A fever may mean the infection is in your kidneys. Other symptoms of a kidney infection include pain in your back or sides below the ribs, nausea, and vomiting. DIAGNOSIS To diagnose a UTI, your caregiver will ask you about your symptoms. Your caregiver will also ask you to provide a urine sample. The urine sample will be tested for bacteria and white blood cells. White blood cells are made by your body to help fight infection. TREATMENT  Typically, UTIs can be treated with medication. Because most UTIs are caused by a bacterial infection, they usually can be treated with the use of antibiotics. The choice of antibiotic and length of treatment depend on your symptoms and the type of bacteria causing your infection. HOME CARE INSTRUCTIONS  If you were prescribed antibiotics, take them exactly as your caregiver  instructs you. Finish the medication even if you feel better after you have only taken some of the medication.  Drink enough water and fluids to keep your urine clear or pale yellow.  Avoid caffeine, tea, and carbonated beverages. They tend to irritate your bladder.  Empty your bladder often. Avoid holding urine for long periods of time.  Empty your bladder before and after sexual intercourse.  After a bowel movement, women should cleanse from front to back. Use each tissue only once. SEEK MEDICAL CARE IF:   You have back pain.  You develop a fever.  Your symptoms do not begin to resolve within 3 days. SEEK IMMEDIATE MEDICAL CARE IF:   You have severe back pain or lower abdominal pain.  You develop chills.  You have nausea or  vomiting.  You have continued burning or discomfort with urination. MAKE SURE YOU:   Understand these instructions.  Will watch your condition.  Will get help right away if you are not doing well or get worse.   This information is not intended to replace advice given to you by your health care provider. Make sure you discuss any questions you have with your health care provider.   Document Released: 10/08/2004 Document Revised: 09/19/2014 Document Reviewed: 02/06/2011 Elsevier Interactive Patient Education [employer]

## 2014-12-09 NOTE — ED Provider Notes (Signed)
CSN: [id number]     Arrival date & time 12/09/14  0006 History   First MD Initiated Contact with Patient 12/09/14 0034     Chief Complaint  Patient presents with  . Abdominal Pain     (Consider location/radiation/quality/duration/timing/severity/associated sxs/prior Treatment) HPI Comments: Patient is a 24 year old G1P0 female, currently [redacted] weeks pregnant, who presents to the emergency department for evaluation of abdominal pain. Patient states that abdominal pain began approximately 2 hours ago. She states that the pain lasts approximately 20-30 minutes and then will calm down for a bit before worsening again. She reports approximately 5 episodes of nonbloody, nonbilious emesis prior to arrival. She complains of the pain mostly in her epigastric region and her upper back. Patient denies taking any medications prior to arrival for symptoms. She has no history of similar symptoms in the past. No history of abdominal surgeries. Patient further denies fever, vaginal bleeding, or a sudden gush of fluid from her vaginal canal. She denies any complications with her pregnancy, other than a few urinary tract infections.  Patient followed by Consuella Lose  Patient is a 24 y.o. female presenting with abdominal pain. The history is provided by the patient. No language interpreter was used.  Abdominal Pain   Past Medical History  Diagnosis Date  . Sickle cell trait (HCC)   . Mental disorder     bipolar disorder dx'd at age 15   Past Surgical History  Procedure Laterality Date  . No past surgeries     History reviewed. No pertinent family history. Social History  Substance Use Topics  . Smoking status: Never Smoker   . Smokeless tobacco: None  . Alcohol Use: No   OB History    Gravida Para Term Preterm AB TAB SAB Ectopic Multiple Living   1              Review of Systems  Gastrointestinal: Positive for abdominal pain.      Allergies  Review of patient's allergies indicates no  known allergies.  Home Medications   Prior to Admission medications   Medication Sig Start Date End Date Taking? Authorizing Provider  Prenatal Vit-Fe Fumarate-FA (PRENATAL MULTIVITAMIN) TABS tablet Take 1 tablet by mouth daily at 12 noon.   Yes Historical Provider, MD  cephALEXin (KEFLEX) 500 MG capsule Take 1 capsule (500 mg total) by mouth 2 (two) times daily. 12/09/14   Antony Madura, PA-C  metoCLOPramide (REGLAN) 10 MG tablet Take 1 tablet (10 mg total) by mouth every 6 (six) hours as needed for nausea or vomiting. 12/09/14   Antony Madura, PA-C  oxyCODONE-acetaminophen (PERCOCET/ROXICET) 5-325 MG tablet Take 1-2 tablets by mouth every 8 (eight) hours as needed for severe pain. 12/09/14   Antony Madura, PA-C   BP 95/58 mmHg  Pulse 99  Temp(Src) 97.5 F (36.4 C) (Oral)  Resp 24  Ht  (1.549 m)  Wt 68.04 kg  BMI 28.36 kg/m2  SpO2 100%  LMP 05/08/2014 (Exact Date)   Physical Exam  Constitutional: She is oriented to person, place, and time. She appears well-developed and well-nourished. No distress.  Patient appears uncomfortable  HENT:  Head: Normocephalic and atraumatic.  Eyes: Conjunctivae and EOM are normal. No scleral icterus.  Neck: Normal range of motion.  Cardiovascular: Normal rate, regular rhythm and intact distal pulses.   Pulmonary/Chest: Effort normal. No respiratory distress. She has no wheezes. She has no rales.  Respirations even and unlabored  Abdominal: She exhibits distension (c/w pregnancy).  Focal epigastric TTP. Negative  Murphy's sign. No peritoneal signs appreciated.  Musculoskeletal: Normal range of motion.  Neurological: She is alert and oriented to person, place, and time. She exhibits normal muscle tone. Coordination normal.  GCS 15. Speech is goal oriented. Patient ambulatory with steady gait.  Skin: Skin is warm and dry. No rash noted. She is not diaphoretic. No erythema. No pallor.  Psychiatric: She has a normal mood and affect. Her behavior is  normal.  Nursing note and vitals reviewed.   ED Course  Procedures (including critical care time) Labs Review Labs Reviewed  CBC WITH DIFFERENTIAL/PLATELET - Abnormal; Notable for the following:    Hemoglobin 11.4 (*)    HCT 34.9 (*)    MCV 77.0 (*)    MCH 25.2 (*)    All other components within normal limits  COMPREHENSIVE METABOLIC PANEL - Abnormal; Notable for the following:    CO2 21 (*)    BUN <5 (*)    Total Protein 6.1 (*)    Albumin 2.9 (*)    ALT 12 (*)    Total Bilirubin 0.1 (*)    All other components within normal limits  URINALYSIS, ROUTINE W REFLEX MICROSCOPIC (NOT AT [facility]) - Abnormal; Notable for the following:    APPearance CLOUDY (*)    Nitrite POSITIVE (*)    Leukocytes, UA LARGE (*)    All other components within normal limits  URINE MICROSCOPIC-ADD ON - Abnormal; Notable for the following:    Squamous Epithelial / LPF 0-5 (*)    Bacteria, UA MANY (*)    All other components within normal limits  URINE CULTURE  LIPASE, BLOOD  URINE RAPID DRUG SCREEN, HOSP PERFORMED  ETHANOL    Imaging Review US Abdomen Complete  12/09/2014  CLINICAL DATA:  Low back pain and abdominal pain, nausea and vomiting for 3 hours. Eight months pregnant. EXAM: ULTRASOUND ABDOMEN COMPLETE COMPARISON:  None. FINDINGS: Gallbladder: Cholelithiasis with several small stones in the gallbladder. Stones are mobile. Gallbladder wall thickness is upper limits of normal. Murphy's sign is negative. Common bile duct: Diameter: 4.1 mm, normal Liver: No focal lesion identified. Within normal limits in parenchymal echogenicity. IVC: No abnormality visualized. Pancreas: Visualized portion unremarkable. Spleen: Size and appearance within normal limits. Right Kidney: Length: 10.1 cm. Echogenicity within normal limits. No mass or hydronephrosis visualized. Left Kidney: Length: 12.2 cm. Echogenicity within normal limits. No mass or hydronephrosis visualized. Abdominal aorta: Visualized portions are normal  in caliber. Mid and distal aorta is obscured to visualization due to pregnancy. Other findings: None. IMPRESSION: Cholelithiasis with several mobile stones in the gallbladder. No inflammatory changes suggested. No hydronephrosis in the kidneys. Electronically Signed   By: Burman Nieves M.D.   On: 12/09/2014 03:14     I have personally reviewed and evaluated these images and lab results as part of my medical decision-making.   EKG Interpretation None      MDM   Final diagnoses:  Biliary colic  UTI (lower urinary tract infection)    Patient is a 24 year old G1P0 female who is currently [redacted] weeks pregnant. She presented to the emergency department for evaluation of sudden onset epigastric pain radiating to her back. Symptoms associated with emesis. Patient with focal epigastric tenderness on exam. She has had no complaints of vaginal bleeding or discharge. No sudden gush of fluid from her vaginal canal. Rapid OB response called to bedside. Patient has been having very sporadic contractions, but is not currently in labor. Her cervix is closed.  Urinalysis today is significant  for a urinary tract infection. Laboratory workup is, otherwise, noncontributory. An ultrasound was obtained which shows cholelithiasis with several mobile stones in the gallbladder. Given the patient's symptoms and presentation, her pain is consistent with biliary colic. Pain has been waxing and waning over ED course, but overall is improving. Patient has been given fentanyl and morphine for pain control. Will transition to oral Percocet. Case has been discussed between Dr. Debroah LoopArnold and [facility] who agree with outpatient management at this time with oral Percocet tabs. Patient to f/u with her OBGYN with [facility]. Patient discharged in good condition; VSS.   Filed Vitals:   12/09/14 0215 12/09/14 0315 12/09/14 0345 12/09/14 0400  BP: 85/73 101/63 95/58   Pulse:  91 108  99  Temp:      TempSrc:      Resp:  24    Height:      Weight:      SpO2: 99% 100%  100%       Antony MaduraKelly Bryston Colocho, PA-C 12/09/14 0524  Loren Raceravid Yelverton, MD 12/11/14 1344

## 2014-12-09 NOTE — ED Notes (Signed)
Pt is now difficult to arouse, falling asleep while speaking. Oxygen saturation is 98% on room air at this time.

## 2014-12-09 NOTE — Progress Notes (Signed)
Pt is a G1P0, 30wk6da, w/OOT OB. C/o abd and back pain. FHT 130/positive acceleration/no decelerations (Cat 1 tracing) and occasional contraction. She rated her pain 10/10, patient is currently asleep, but arousable. Dr. Debroah LoopArnold made aware of this and that patient has UTI and gallstones. OK to d/c patient w/percoset after antibx complete w/Percoset prescription, per Dr. Debroah LoopArnold. OB cleared.

## 2014-12-11 LAB — URINE CULTURE: Culture: >=100000

## 2014-12-12 ENCOUNTER — Telehealth (HOSPITAL_BASED_OUTPATIENT_CLINIC_OR_DEPARTMENT_OTHER): Payer: Self-pay | Admitting: Emergency Medicine

## 2014-12-12 NOTE — Telephone Encounter (Signed)
Post ED Visit - Positive Culture Follow-up  Culture report reviewed by antimicrobial stewardship pharmacist:  []  Katelyn Mendoza, Pharm.D. []  Katelyn Mendoza, Pharm.D., BCPS []  Katelyn Mendoza, Pharm.D. []  Katelyn Mendoza, Pharm.D., BCPS []  Katelyn Mendoza, [address]D., BCPS, AAHIVP []  Katelyn Mendoza, Pharm.D., BCPS, AAHIVP []  Katelyn Mendoza, Pharm.D. [x]  Katelyn Mendoza, [address]  Positive urine culture E. Coli Treated with cephalexin, organism sensitive to the same and no further patient follow-up is required at this time.  Katelyn Mendoza, Katelyn Mendoza 12/12/2014, 12:15 PM

## 2014-12-20 ENCOUNTER — Ambulatory Visit: Payer: Self-pay | Admitting: General Surgery

## 2015-01-13 NOTE — L&D Delivery Note (Signed)
Obstetrical Delivery Note   Date of Delivery:   02/18/2015 Primary OB:   [facility] Gestational Age/EDD: [redacted]w[redacted]d (Dated by 10wk ultrasound) Antepartum complications: H/O HSV II , [place] trait, UTIs this pregnancy, gallstones, bipolar disorder  Delivered By:   Farrel Conners, CNM  Delivery Type:   vacuum, outlet after 2 pulls, no pop offs for variable decelerations Procedure Details:   Recurrent FHR decelerations to 70s-90s with second stage prompted application of vacuum at outlet. Position was LOA. Baby delivered after two pulls with one reapplication. Baby initially not crying, but responded to tactile stimulation. Terminal meconium noted after delivery of baby. Baby initially placed on mother abdomen and after a delay of about 1-2 minutes, cord was clamped and was then cut by a family member.  Anesthesia:    epidural Intrapartum complications: Variable decelerations GBS:    Positive-was adequately treated Laceration:    none Episiotomy:    none Placenta:    Via active 3rd stage. To pathology: no Estimated Blood Loss:  * No surgery found *  Baby:    Liveborn female, Apgars 7/9, weight 3030gm (6#11oz)/ Katelyn Mendoza/ Formula    Katelyn Mendoza, CNM

## 2015-01-22 ENCOUNTER — Encounter: Payer: Self-pay | Admitting: *Deleted

## 2015-01-22 LAB — OB RESULTS CONSOLE GBS: STREP GROUP B AG: POSITIVE

## 2015-01-22 LAB — OB RESULTS CONSOLE GC/CHLAMYDIA
Chlamydia: NEGATIVE
Gonorrhea: NEGATIVE

## 2015-01-30 ENCOUNTER — Observation Stay
Admission: EM | Admit: 2015-01-30 | Discharge: 2015-01-30 | Disposition: A | Discharge status: Home / Self Care | Payer: Medicaid Other | Attending: Advanced Practice Midwife | Admitting: Advanced Practice Midwife

## 2015-01-30 DIAGNOSIS — R51 Headache: Secondary | ICD-10-CM | POA: Diagnosis not present

## 2015-01-30 DIAGNOSIS — O98313 Other infections with a predominantly sexual mode of transmission complicating pregnancy, third trimester: Secondary | ICD-10-CM | POA: Insufficient documentation

## 2015-01-30 DIAGNOSIS — A6000 Herpesviral infection of urogenital system, unspecified: Secondary | ICD-10-CM | POA: Diagnosis not present

## 2015-01-30 DIAGNOSIS — F319 Bipolar disorder, unspecified: Secondary | ICD-10-CM | POA: Diagnosis not present

## 2015-01-30 DIAGNOSIS — O26893 Other specified pregnancy related conditions, third trimester: Secondary | ICD-10-CM | POA: Insufficient documentation

## 2015-01-30 DIAGNOSIS — O99013 Anemia complicating pregnancy, third trimester: Secondary | ICD-10-CM | POA: Insufficient documentation

## 2015-01-30 DIAGNOSIS — O99343 Other mental disorders complicating pregnancy, third trimester: Secondary | ICD-10-CM | POA: Insufficient documentation

## 2015-01-30 DIAGNOSIS — Z3A38 38 weeks gestation of pregnancy: Secondary | ICD-10-CM | POA: Diagnosis not present

## 2015-01-30 DIAGNOSIS — D573 Sickle-cell trait: Secondary | ICD-10-CM | POA: Diagnosis not present

## 2015-01-30 DIAGNOSIS — R519 Headache, unspecified: Secondary | ICD-10-CM | POA: Diagnosis present

## 2015-01-30 DIAGNOSIS — O26899 Other specified pregnancy related conditions, unspecified trimester: Secondary | ICD-10-CM | POA: Diagnosis present

## 2015-01-30 DIAGNOSIS — O212 Late vomiting of pregnancy: Principal | ICD-10-CM | POA: Insufficient documentation

## 2015-01-30 LAB — COMPREHENSIVE METABOLIC PANEL
ALBUMIN: 2.9 g/dL — AB (ref 3.5–5.0)
ALT: 10 U/L — ABNORMAL LOW (ref 14–54)
ANION GAP: 5 (ref 5–15)
AST: 17 U/L (ref 15–41)
Alkaline Phosphatase: 229 U/L — ABNORMAL HIGH (ref 38–126)
BUN: 6 mg/dL (ref 6–20)
CHLORIDE: 107 mmol/L (ref 101–111)
CO2: 24 mmol/L (ref 22–32)
Calcium: 8.9 mg/dL (ref 8.9–10.3)
Creatinine, Ser: 0.65 mg/dL (ref 0.44–1.00)
GFR calc non Af Amer: >60 mL/min (ref >60)
GLUCOSE: 100 mg/dL — AB (ref 65–99)
POTASSIUM: 3.8 mmol/L (ref 3.5–5.1)
SODIUM: 136 mmol/L (ref 135–145)
Total Bilirubin: 0.3 mg/dL (ref 0.3–1.2)
Total Protein: 6.2 g/dL — ABNORMAL LOW (ref 6.5–8.1)

## 2015-01-30 LAB — URINALYSIS COMPLETE WITH MICROSCOPIC (ARMC ONLY)
Bilirubin Urine: NEGATIVE
Glucose, UA: NEGATIVE mg/dL
Hgb urine dipstick: NEGATIVE
KETONES UR: NEGATIVE mg/dL
Leukocytes, UA: NEGATIVE
Nitrite: NEGATIVE
PH: 6 (ref 5.0–8.0)
PROTEIN: NEGATIVE mg/dL
Specific Gravity, Urine: 1.013 (ref 1.005–1.030)

## 2015-01-30 LAB — CBC
HEMATOCRIT: 35.8 % (ref 35.0–47.0)
HEMOGLOBIN: 12.1 g/dL (ref 12.0–16.0)
MCH: 25.1 pg — AB (ref 26.0–34.0)
MCHC: 33.8 g/dL (ref 32.0–36.0)
MCV: 74.1 fL — AB (ref 80.0–100.0)
Platelets: 281 10*3/uL (ref 150–440)
RBC: 4.83 MIL/uL (ref 3.80–5.20)
RDW: 16.7 % — ABNORMAL HIGH (ref 11.5–14.5)
WBC: 10.2 10*3/uL (ref 3.6–11.0)

## 2015-01-30 LAB — PROTEIN / CREATININE RATIO, URINE
CREATININE, URINE: 127 mg/dL
PROTEIN CREATININE RATIO: 0.09 mg/mg{creat} (ref 0.00–0.15)
Total Protein, Urine: 12 mg/dL

## 2015-01-30 MED ORDER — ACETAMINOPHEN 325 MG PO TABS: 650.0000 mg | ORAL_TABLET | ORAL | Status: DC | PRN

## 2015-01-30 MED ORDER — PROMETHAZINE HCL 25 MG PO TABS
ORAL_TABLET | ORAL | Status: AC
  Administered 2015-01-30: 25 mg via ORAL
  Filled 2015-01-30: qty 1

## 2015-01-30 MED ORDER — PROMETHAZINE HCL 25 MG/ML IJ SOLN: 25.0000 mg | INTRAMUSCULAR | Status: DC | PRN

## 2015-01-30 MED ORDER — PROMETHAZINE HCL 25 MG RE SUPP
25.0000 mg | RECTAL | Status: DC | PRN
  Filled 2015-01-30: qty 1

## 2015-01-30 MED ORDER — PROMETHAZINE HCL 25 MG PO TABS
25.0000 mg | ORAL_TABLET | ORAL | Status: DC | PRN
  Administered 2015-01-30: 25 mg via ORAL

## 2015-01-30 MED ORDER — LACTATED RINGERS IV BOLUS (SEPSIS)
500.0000 mL | Freq: Once | INTRAVENOUS | Status: AC
  Administered 2015-01-30: 1000 mL via INTRAVENOUS

## 2015-01-30 MED ORDER — MORPHINE SULFATE (PF) 2 MG/ML IV SOLN
2.0000 mg | Freq: Once | INTRAVENOUS | Status: AC
  Administered 2015-01-30: 2 mg via INTRAVENOUS

## 2015-01-30 MED ORDER — MORPHINE SULFATE (PF) 2 MG/ML IV SOLN
INTRAVENOUS | Status: AC
  Administered 2015-01-30: 2 mg via INTRAVENOUS
  Filled 2015-01-30: qty 1

## 2015-01-30 MED ORDER — LACTATED RINGERS IV SOLN
INTRAVENOUS | Status: DC
  Administered 2015-01-30: 15:00:00 via INTRAVENOUS

## 2015-01-30 NOTE — OB Triage Note (Signed)
Patient present to BP with c/o HA and vomiting since yesterday, reports fetal movement, denies leakage of fluid, denies blurred vision, dizziness.

## 2015-01-30 NOTE — Discharge Summary (Signed)
S: Reports feeling much better after IV hydration, phenergan and morphine. Ready to be d/c home.   O: VSS, toco and FHR monitor d/c as pt is here for non-obstetric complaints  A: Likely viral illness  P: D/c home  Rx for Phenergan for residual nausea F/u at office for regularly scheduled OB visit on 1/20

## 2015-01-30 NOTE — H&P (Signed)
Obstetric History and Physical  Katelyn Mendoza is a 25 y.o. G1P0 with Estimated Date of Delivery: 02/12/15 per LMP and 10 wk [place] who presents at [redacted]w[redacted]d presenting for severe headache and nausea and vomiting. Symptoms started yesterday, she was seen by Dr Bonney Aid at the office and had normal lipase and CMP. Pt had Rx for Percocet for gallstones dx earlier in pregnancy, she took 1 this am and it did not help her headache so she decided to come in for evaluation. Denies diarrhea, scotoma, edema.  Patient states she has been having no contractions, no vaginal bleeding, intact membranes, with active fetal movement.    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 Headache in pregnancy, antepartum 01/30/2015   She plans to bottle feed   Prenatal Transfer Tool   Past Medical History  Diagnosis Date  . Sickle cell trait (HCC)   . Mental disorder     bipolar disorder dx'd at age 33  Gallstones HSV  Past Surgical History  Procedure Laterality Date  . No past surgeries      OB History  Gravida Para Term Preterm AB SAB TAB Ectopic Multiple Living  1             # Outcome Date GA Lbr Len/2nd Weight Sex Delivery Anes PTL Lv  1 Current               Social History   Social History  . Marital Status: Single    Spouse Name: N/A  . Number of Children: N/A  . Years of Education: N/A   Social History Main Topics  . Smoking status: Never Smoker   . Smokeless tobacco: None  . Alcohol Use: No  . Drug Use: No  . Sexual Activity: Yes   Other Topics Concern  . None   Social History Narrative    History reviewed. No pertinent family history.  Prescriptions prior to admission  Medication Sig Dispense Refill Last Dose  . cephALEXin (KEFLEX) 500 MG  capsule Take 1 capsule (500 mg total) by mouth 2 (two) times daily. 14 capsule 0 01/29/2015 at Unknown time  . metoCLOPramide (REGLAN) 10 MG tablet Take 1 tablet (10 mg total) by mouth every 6 (six) hours as needed for nausea or vomiting. 30 tablet 0 01/30/2015 at Unknown time  . oxyCODONE-acetaminophen (PERCOCET/ROXICET) 5-325 MG tablet Take 1-2 tablets by mouth every 8 (eight) hours as needed for severe pain. 20 tablet 0 01/29/2015 at Unknown time  . Prenatal Vit-Fe Fumarate-FA (PRENATAL MULTIVITAMIN) TABS tablet Take 1 tablet by mouth daily at 12 noon.   Past Week at Unknown time  . valACYclovir (VALTREX) 500 MG tablet Take 500 mg by mouth daily.   01/29/2015 at Unknown time    No Known Allergies  Review of Systems: Negative except for what is mentioned in HPI.  Physical Exam: BP 106/62 mmHg  Pulse 87  Temp(Src) 99 F (37.2 C) (Oral)  Resp 18  LMP 05/08/2014 (Exact Date) GENERAL: Well-developed, well-nourished female in no acute distress.  ABDOMEN: Soft, nontender, nondistended, gravid. EXTREMITIES: Nontender, no edema Cervical Exam: deferred FHT: Category: 1 Baseline rate 140 bpm   Variability moderate  Accelerations present   Decelerations none Contractions: absent   Pertinent Labs/Studies:   Results  for orders placed or performed during the hospital encounter of 01/30/15 (from the past 24 hour(s))  Protein / creatinine ratio, urine     Status: None   Collection Time: 01/30/15  8:56 AM  Result Value Ref Range   Creatinine, Urine 127 mg/dL   Total Protein, Urine 12 mg/dL   Protein Creatinine Ratio 0.09 0.00 - 0.15 mg/mg[Cre]  Urinalysis complete, with microscopic ([facility] only)     Status: Abnormal   Collection Time: 01/30/15  8:56 AM  Result Value Ref Range   Color, Urine YELLOW (A) YELLOW   APPearance HAZY (A) CLEAR   Glucose, UA NEGATIVE NEGATIVE mg/dL   Bilirubin Urine NEGATIVE NEGATIVE   Ketones, ur NEGATIVE NEGATIVE mg/dL   Specific Gravity, Urine 1.013 1.005 - 1.030    Hgb urine dipstick NEGATIVE NEGATIVE   pH 6.0 5.0 - 8.0   Protein, ur NEGATIVE NEGATIVE mg/dL   Nitrite NEGATIVE NEGATIVE   Leukocytes, UA NEGATIVE NEGATIVE   RBC / HPF 0-5 0 - 5 RBC/hpf   WBC, UA 0-5 0 - 5 WBC/hpf   Bacteria, UA FEW (A) NONE SEEN   Squamous Epithelial / LPF 6-30 (A) NONE SEEN   Mucous PRESENT   Comprehensive metabolic panel     Status: Abnormal   Collection Time: 01/30/15  8:58 AM  Result Value Ref Range   Sodium 136 135 - 145 mmol/L   Potassium 3.8 3.5 - 5.1 mmol/L   Chloride 107 101 - 111 mmol/L   CO2 24 22 - 32 mmol/L   Glucose, Bld 100 (H) 65 - 99 mg/dL   BUN 6 6 - 20 mg/dL   Creatinine, Ser 0.45 0.44 - 1.00 mg/dL   Calcium 8.9 8.9 - 40.9 mg/dL   Total Protein 6.2 (L) 6.5 - 8.1 g/dL   Albumin 2.9 (L) 3.5 - 5.0 g/dL   AST 17 15 - 41 U/L   ALT 10 (L) 14 - 54 U/L   Alkaline Phosphatase 229 (H) 38 - 126 U/L   Total Bilirubin 0.3 0.3 - 1.2 mg/dL   GFR calc non Af Amer >60 >60 mL/min   GFR calc Af Amer >60 >60 mL/min   Anion gap 5 5 - 15  CBC     Status: Abnormal   Collection Time: 01/30/15  8:58 AM  Result Value Ref Range   WBC 10.2 3.6 - 11.0 K/uL   RBC 4.83 3.80 - 5.20 MIL/uL   Hemoglobin 12.1 12.0 - 16.0 g/dL   HCT 81.1 91.4 - 78.2 %   MCV 74.1 (L) 80.0 - 100.0 fL   MCH 25.1 (L) 26.0 - 34.0 pg   MCHC 33.8 32.0 - 36.0 g/dL   RDW 95.6 (H) 21.3 - 08.6 %   Platelets 281 150 - 440 K/uL    Assessment : IUP at [redacted]w[redacted]d with severe ha and nausea/vomiting - likely viral illness  Plan: IV fluid bolus - pt reported some improvement in pain (7/10 down from 10/10) Morphine and phenergan, con't to monitor sx

## 2015-01-30 NOTE — ED Notes (Signed)
37 wks preg, has had headache and vomiting past 2 days, no preg issues today

## 2015-02-18 ENCOUNTER — Encounter: Payer: Self-pay | Admitting: Certified Nurse Midwife

## 2015-02-18 ENCOUNTER — Inpatient Hospital Stay: Payer: Medicaid Other | Admitting: Anesthesiology

## 2015-02-18 ENCOUNTER — Inpatient Hospital Stay
Admission: EM | Admit: 2015-02-18 | Discharge: 2015-02-20 | DRG: 775 | Disposition: A | Discharge status: Home / Self Care | Payer: Medicaid Other | Attending: Certified Nurse Midwife | Admitting: Certified Nurse Midwife

## 2015-02-18 DIAGNOSIS — O99824 Streptococcus B carrier state complicating childbirth: Secondary | ICD-10-CM | POA: Diagnosis present

## 2015-02-18 DIAGNOSIS — O480 Post-term pregnancy: Secondary | ICD-10-CM | POA: Diagnosis present

## 2015-02-18 DIAGNOSIS — Z3A41 41 weeks gestation of pregnancy: Secondary | ICD-10-CM | POA: Diagnosis not present

## 2015-02-18 DIAGNOSIS — Z87440 Personal history of urinary (tract) infections: Secondary | ICD-10-CM

## 2015-02-18 DIAGNOSIS — F319 Bipolar disorder, unspecified: Secondary | ICD-10-CM | POA: Diagnosis present

## 2015-02-18 DIAGNOSIS — D573 Sickle-cell trait: Secondary | ICD-10-CM | POA: Diagnosis present

## 2015-02-18 DIAGNOSIS — Z79899 Other long term (current) drug therapy: Secondary | ICD-10-CM

## 2015-02-18 DIAGNOSIS — O4290 Premature rupture of membranes, unspecified as to length of time between rupture and onset of labor, unspecified weeks of gestation: Secondary | ICD-10-CM | POA: Diagnosis present

## 2015-02-18 DIAGNOSIS — O99344 Other mental disorders complicating childbirth: Secondary | ICD-10-CM | POA: Diagnosis present

## 2015-02-18 DIAGNOSIS — D62 Acute posthemorrhagic anemia: Secondary | ICD-10-CM | POA: Diagnosis present

## 2015-02-18 HISTORY — DX: Calculus of gallbladder without cholecystitis without obstruction: K80.20

## 2015-02-18 HISTORY — DX: Unspecified infection of urinary tract in pregnancy, unspecified trimester: O23.40

## 2015-02-18 LAB — URINE DRUG SCREEN, QUALITATIVE (ARMC ONLY)
Amphetamines, Ur Screen: NOT DETECTED
BARBITURATES, UR SCREEN: NOT DETECTED
Benzodiazepine, Ur Scrn: NOT DETECTED
CANNABINOID 50 NG, UR ~~LOC~~: NOT DETECTED
COCAINE METABOLITE, UR ~~LOC~~: NOT DETECTED
MDMA (Ecstasy)Ur Screen: NOT DETECTED
METHADONE SCREEN, URINE: NOT DETECTED
OPIATE, UR SCREEN: NOT DETECTED
Phencyclidine (PCP) Ur S: NOT DETECTED
Tricyclic, Ur Screen: NOT DETECTED

## 2015-02-18 LAB — CBC
HCT: 34.2 % — ABNORMAL LOW (ref 35.0–47.0)
Hemoglobin: 11.6 g/dL — ABNORMAL LOW (ref 12.0–16.0)
MCH: 25.4 pg — AB (ref 26.0–34.0)
MCHC: 33.9 g/dL (ref 32.0–36.0)
MCV: 74.9 fL — ABNORMAL LOW (ref 80.0–100.0)
PLATELETS: 221 10*3/uL (ref 150–440)
RBC: 4.57 MIL/uL (ref 3.80–5.20)
RDW: 17.9 % — ABNORMAL HIGH (ref 11.5–14.5)
WBC: 8.4 10*3/uL (ref 3.6–11.0)

## 2015-02-18 LAB — CHLAMYDIA/NGC RT PCR (ARMC ONLY)
Chlamydia Tr: NOT DETECTED
N GONORRHOEAE: NOT DETECTED

## 2015-02-18 LAB — TYPE AND SCREEN
ABO/RH(D): A POS
Antibody Screen: NEGATIVE

## 2015-02-18 MED ORDER — OXYTOCIN 10 UNIT/ML IJ SOLN
INTRAMUSCULAR | Status: AC
  Filled 2015-02-18: qty 2

## 2015-02-18 MED ORDER — LIDOCAINE HCL (PF) 1 % IJ SOLN
INTRAMUSCULAR | Status: AC
  Administered 2015-02-18: 3 mL
  Filled 2015-02-18: qty 30

## 2015-02-18 MED ORDER — KETOROLAC TROMETHAMINE 30 MG/ML IJ SOLN: 30.0000 mg | Freq: Four times a day (QID) | INTRAMUSCULAR | Status: DC | PRN

## 2015-02-18 MED ORDER — DIPHENHYDRAMINE HCL 50 MG/ML IJ SOLN: 12.5000 mg | INTRAMUSCULAR | Status: DC | PRN

## 2015-02-18 MED ORDER — FENTANYL 2.5 MCG/ML W/ROPIVACAINE 0.2% IN NS 100 ML EPIDURAL INFUSION (ARMC-ANES)
EPIDURAL | Status: AC
  Administered 2015-02-18: 10 mL/h via EPIDURAL
  Filled 2015-02-18: qty 100

## 2015-02-18 MED ORDER — ONDANSETRON HCL 4 MG/2ML IJ SOLN
4.0000 mg | Freq: Three times a day (TID) | INTRAMUSCULAR | Status: DC | PRN
  Filled 2015-02-18: qty 2

## 2015-02-18 MED ORDER — OXYTOCIN 40 UNITS IN LACTATED RINGERS INFUSION - SIMPLE MED
INTRAVENOUS | Status: AC
  Administered 2015-02-18: 1 m[IU]/min via INTRAVENOUS
  Filled 2015-02-18: qty 1000

## 2015-02-18 MED ORDER — NALBUPHINE HCL 10 MG/ML IJ SOLN: 5.0000 mg | INTRAMUSCULAR | Status: DC | PRN

## 2015-02-18 MED ORDER — NALBUPHINE HCL 10 MG/ML IJ SOLN: 5.0000 mg | Freq: Once | INTRAMUSCULAR | Status: DC | PRN

## 2015-02-18 MED ORDER — SODIUM CHLORIDE 0.9 % IV SOLN
INTRAVENOUS | Status: AC
  Administered 2015-02-18: 2 g via INTRAVENOUS
  Filled 2015-02-18: qty 2000

## 2015-02-18 MED ORDER — DIPHENHYDRAMINE HCL 25 MG PO CAPS: 25.0000 mg | ORAL_CAPSULE | ORAL | Status: DC | PRN

## 2015-02-18 MED ORDER — LACTATED RINGERS IV SOLN: 500.0000 mL | INTRAVENOUS | Status: DC | PRN

## 2015-02-18 MED ORDER — TERBUTALINE SULFATE 1 MG/ML IJ SOLN: 0.2500 mg | Freq: Once | INTRAMUSCULAR | Status: DC | PRN

## 2015-02-18 MED ORDER — OXYTOCIN 40 UNITS IN LACTATED RINGERS INFUSION - SIMPLE MED: 2.5000 [IU]/h | INTRAVENOUS | Status: DC

## 2015-02-18 MED ORDER — SODIUM CHLORIDE 0.9 % IV SOLN
1.0000 g | INTRAVENOUS | Status: DC
  Administered 2015-02-18 (×2): 1 g via INTRAVENOUS
  Filled 2015-02-18 (×5): qty 1000

## 2015-02-18 MED ORDER — SODIUM CHLORIDE 0.9% FLUSH: 3.0000 mL | INTRAVENOUS | Status: DC | PRN

## 2015-02-18 MED ORDER — LIDOCAINE HCL (PF) 1 % IJ SOLN: 30.0000 mL | INTRAMUSCULAR | Status: DC | PRN

## 2015-02-18 MED ORDER — BUPIVACAINE HCL (PF) 0.25 % IJ SOLN
INTRAMUSCULAR | Status: DC | PRN
  Administered 2015-02-18 (×2): 4 mL via EPIDURAL

## 2015-02-18 MED ORDER — NALOXONE HCL 2 MG/2ML IJ SOSY
1.0000 ug/kg/h | PREFILLED_SYRINGE | INTRAVENOUS | Status: DC | PRN
  Filled 2015-02-18: qty 2

## 2015-02-18 MED ORDER — OXYTOCIN BOLUS FROM INFUSION
500.0000 mL | INTRAVENOUS | Status: DC
  Administered 2015-02-18: 500 mL via INTRAVENOUS

## 2015-02-18 MED ORDER — MISOPROSTOL 200 MCG PO TABS
ORAL_TABLET | ORAL | Status: AC
  Filled 2015-02-18: qty 4

## 2015-02-18 MED ORDER — OXYTOCIN 40 UNITS IN LACTATED RINGERS INFUSION - SIMPLE MED
1.0000 m[IU]/min | INTRAVENOUS | Status: DC
  Administered 2015-02-18: 1 m[IU]/min via INTRAVENOUS

## 2015-02-18 MED ORDER — BUTORPHANOL TARTRATE 1 MG/ML IJ SOLN
2.0000 mg | INTRAMUSCULAR | Status: DC | PRN
  Administered 2015-02-18: 2 mg via INTRAVENOUS

## 2015-02-18 MED ORDER — MISOPROSTOL 200 MCG PO TABS
800.0000 ug | ORAL_TABLET | Freq: Once | ORAL | Status: DC | PRN
  Filled 2015-02-18: qty 4

## 2015-02-18 MED ORDER — SODIUM CHLORIDE 0.9 % IV SOLN
2.0000 g | Freq: Once | INTRAVENOUS | Status: AC
  Administered 2015-02-18: 2 g via INTRAVENOUS

## 2015-02-18 MED ORDER — AMMONIA AROMATIC IN INHA
RESPIRATORY_TRACT | Status: AC
Start: 2015-02-18 — End: 2015-02-18
  Filled 2015-02-18: qty 10

## 2015-02-18 MED ORDER — NALOXONE HCL 0.4 MG/ML IJ SOLN: 0.4000 mg | INTRAMUSCULAR | Status: DC | PRN

## 2015-02-18 MED ORDER — ONDANSETRON HCL 4 MG/2ML IJ SOLN
4.0000 mg | Freq: Four times a day (QID) | INTRAMUSCULAR | Status: DC | PRN
  Administered 2015-02-18: 4 mg via INTRAVENOUS
  Filled 2015-02-18: qty 2

## 2015-02-18 MED ORDER — LIDOCAINE-EPINEPHRINE (PF) 1.5 %-1:200000 IJ SOLN
INTRAMUSCULAR | Status: DC | PRN
  Administered 2015-02-18: 3 mg via PERINEURAL

## 2015-02-18 MED ORDER — ONDANSETRON HCL 4 MG/2ML IJ SOLN
4.0000 mg | INTRAMUSCULAR | Status: DC | PRN
  Administered 2015-02-18: 4 mg via INTRAVENOUS

## 2015-02-18 MED ORDER — SCOPOLAMINE 1 MG/3DAYS TD PT72
1.0000 | MEDICATED_PATCH | Freq: Once | TRANSDERMAL | Status: DC
  Filled 2015-02-18: qty 1

## 2015-02-18 MED ORDER — AMMONIA AROMATIC IN INHA: 0.3000 mL | Freq: Once | RESPIRATORY_TRACT | Status: DC | PRN

## 2015-02-18 MED ORDER — MEPERIDINE HCL 25 MG/ML IJ SOLN: 6.2500 mg | INTRAMUSCULAR | Status: DC | PRN

## 2015-02-18 MED ORDER — BUTORPHANOL TARTRATE 1 MG/ML IJ SOLN
INTRAMUSCULAR | Status: AC
  Administered 2015-02-18: 2 mg via INTRAVENOUS
  Filled 2015-02-18: qty 2

## 2015-02-18 MED ORDER — LACTATED RINGERS IV SOLN
INTRAVENOUS | Status: DC
  Administered 2015-02-18 (×3): via INTRAVENOUS

## 2015-02-18 NOTE — H&P (Signed)
OB History & Physical   History of Present Illness:  Chief Complaint:  "I can't stop peeing on myself." HPI:  Katelyn Mendoza is a 26 y.o. G2P0010 female at 12w0ddated by a 10 week ultrasound.  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  She presents to L&D for evaluation of leakage of fluid..   The fluid is clear, with a little blood tinge at times. Denies regular contractions on arrival.Baby active. Pren[facility] care site[facility]. PNC has also been remarkable for a normal anatomy scan, a growth scan at 341w2dith EFW 4#1oz (56%), a 32# weight gain, and is GBS positive. Received TDAP 12/12/14. Had a positive UDS for THC in 07/11/2014 and a negative UDS 08/30/2014.      Maternal Medical History:   Past Medical History  Diagnosis Date  . [place]  . Mental disorder     bipolar disorder dx'd at age 25. Gallstones   . Urinary tract infection affecting care of mother, antepartum     Past Surgical History  Procedure Laterality Date  . No past surgeries      No Known Allergies  Prior to Admission medications   Medication Sig Start Date End Date Taking? Authorizing Provider  cephALEXin (KEFLEX) 250 MG capsule Take 250 mg by mouth at bedtime.   Yes Historical Provider, MD  metoCLOPramide (REGLAN) 10 MG tablet Take 1 tablet (10 mg total) by mouth every 6 (six) hours as needed for nausea or vomiting. 12/09/14  Yes KeAntonietta BreachPA-C  oxyCODONE-acetaminophen (PERCOCET/ROXICET) 5-325 MG tablet Take 1-2 tablets by mouth every 8 (eight) hours as needed for severe pain. 12/09/14  Yes KeAntonietta BreachPA-C  Prenatal Vit-Fe Fumarate-FA (PRENATAL MULTIVITAMIN) TABS tablet Take 1 tablet by mouth daily at 12 noon.   Yes Historical Provider, MD  valACYclovir (VALTREX) 500 MG tablet  Take 500 mg by mouth daily.   Yes Historical Provider, MD          Social History: She  reports that she has never smoked. She does not have any smokeless tobacco history on file. She reports that she does not drink alcohol or use illicit drugs.  Family History: family history is not on file.   Review of Systems: Negative x 10 systems reviewed except as noted in the HPI.      Physical Exam:  Vital Signs: BP 117/72 mmHg  Pulse 74  Temp(Src) 98 F (36.7 C) (Oral)  Resp 16  LMP 05/08/2014 (Exact Date) General: no acute distress.  HEENT: normocephalic, atraumatic Heart: regular rate & rhythm.  No murmurs/ Lungs: clear to auscultation bilaterally Abdomen: soft, gravid, non-tender;  EFW: 7#10oz Pelvic:   External: Normal external female genitalia/ no lesions  Vagina: + pooling clear fluid, + Nitrazine, +fern/ no lesions  Cervix: Dilation: 3 / Effacement (%): 70 / Station: -2 /forebag present per RN exam.  Extremities: non-tender, symmetric, traceedema bilaterally.  DTRs: +1  Neurologic: Alert & oriented x 3.    Pertinent Results:  Prenatal Labs: Blood type/Rh A positive  Antibody screen negative  Rubella Varicella MMR x2 Immune  RPR Non reactive  HBsAg negative  HIV negative  GC negative  Chlamydia negative  Genetic screening Quad screen negative  1 hour GTT 107  3 hour GTT NA  GBS positive on 01/22/2015  FHR 130s with accelerations to 140s to 150, moderate variability. Toco: irregular contractions, q3-10 min apart Bedside Ultrasound:   Assessment:  Katelyn Mendoza is a 25 y.o. 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  Plan:  1. Admit to Labor & Delivery   2. CBC, T&S, Clrs, IVF 3. GBS positive-PPX begun   4. Consents obtained. 5. Pitocin augmentation once PPX begun. Explained risks of hyperstimulation, FTP, FITL, and C-section.  6.   UDS  Katelyn Mendoza  02/18/2015 9:49 AM

## 2015-02-18 NOTE — Progress Notes (Signed)
L&D Progress Note  S: Rates pain 9/10. Moaning with contractions  O: BP 121/79 mmHg  Pulse 78  Temp(Src) 98.6 F (37 C) (Oral)  Resp 16  Ht  (1.549 m)  Wt 78.926 kg (174 lb)  BMI 32.89 kg/m2  LMP 05/08/2014 (Exact Date)  FHR: 135-140 with accelerations to 150s, moderate variability Toco: Contractions q 2-4 with some coupling on 6 miu Pitocin Cervix: 4/90%/-1 Forebag ruptured for small amt clear fluid  A: Progressing in labor  P: Stadol for pain Epidural if desires Continue to monitor progress and maternal fetal well being  Liem Copenhaver, CNM

## 2015-02-18 NOTE — Anesthesia Preprocedure Evaluation (Addendum)
Anesthesia Evaluation  Patient identified by MRN, date of birth, ID band Patient awake    Reviewed: Allergy & Precautions, NPO status , Patient's Chart, lab work & pertinent test results, reviewed documented beta blocker date and time   Airway Mallampati: II  TM Distance: >3 FB     Dental  (+) Chipped   Pulmonary neg pulmonary ROS,    Pulmonary exam normal        Cardiovascular negative cardio ROS Normal cardiovascular exam Rhythm:regular Rate:Normal     Neuro/Psych  Headaches, Anxiety negative neurological ROS  negative psych ROS   GI/Hepatic negative GI ROS, Neg liver ROS,   Endo/Other  negative endocrine ROS  Renal/GU negative Renal ROS  negative genitourinary   Musculoskeletal negative musculoskeletal ROS (+)   Abdominal   Peds negative pediatric ROS (+)  Hematology negative hematology ROS (+)   Anesthesia Other Findings   Reproductive/Obstetrics (+) Pregnancy                            Anesthesia Physical Anesthesia Plan  ASA: II  Anesthesia Plan: Epidural   Post-op Pain Management:    Induction:   Airway Management Planned:   Additional Equipment:   Intra-op Plan:   Post-operative Plan:   Informed Consent: I have reviewed the patients History and Physical, chart, labs and discussed the procedure including the risks, benefits and alternatives for the proposed anesthesia with the patient or authorized representative who has indicated his/her understanding and acceptance.     Plan Discussed with: CRNA  Anesthesia Plan Comments:         Anesthesia Quick Evaluation

## 2015-02-18 NOTE — Progress Notes (Signed)
L&D Progress Note  S: Much more comfortable after her epidural  O: 109/71 FHR: 125 to 130 baseline with minimal to moderate variability, early decelerations currently.Had a couple of variable decelerations to 90s while supine for exam and catheter.Positive scalp stim IUPC inserted earlier when forebag was AROMed - frequent/ coupling on 98miu/min Pitocin, Pitocin decreased to 57miu/min  And O2 applied for minimal variability-with resulting improved variability Cervix : 6-7/ 90%/-1  A: Progressing Episodes of Cat2 FHR strip-have responded to IV hydration earlier, position change, O2 and decreasing pitocin when contractions too close together  P: Continue to monitor FHR closely Continue Pitocin.  Farrel Conners, CNM

## 2015-02-18 NOTE — Anesthesia Procedure Notes (Signed)
Epidural Patient location during procedure: OB Start time: 02/18/2015 3:12 PM  Staffing Resident/CRNA: Stormy Fabian Performed by: resident/CRNA   Preanesthetic Checklist Completed: patient identified, site marked, surgical consent, pre-op evaluation, timeout performed, IV checked, risks and benefits discussed and monitors and equipment checked  Epidural Patient position: sitting Prep: Betadine Patient monitoring: heart rate, continuous pulse ox and blood pressure Approach: midline Location: L4-L5 Injection technique: LOR saline  Needle:  Needle type: Tuohy  Needle gauge: 18 G Needle length: 9 cm and 9 Needle insertion depth: 7 cm Catheter type: closed end flexible Catheter size: 20 Guage Catheter at skin depth: 13 cm Test dose: negative and 1.5% lidocaine with Epi 1:200 K  Assessment Sensory level: T10 Events: blood not aspirated, injection not painful, no injection resistance, negative IV test and no paresthesia  Additional Notes   Patient tolerated the insertion well without complications.Reason for block:procedure for pain

## 2015-02-19 LAB — CBC
HEMATOCRIT: 27.7 % — AB (ref 35.0–47.0)
Hemoglobin: 9.2 g/dL — ABNORMAL LOW (ref 12.0–16.0)
MCH: 24.9 pg — AB (ref 26.0–34.0)
MCHC: 33.3 g/dL (ref 32.0–36.0)
MCV: 74.8 fL — AB (ref 80.0–100.0)
PLATELETS: 192 10*3/uL (ref 150–440)
RBC: 3.7 MIL/uL — ABNORMAL LOW (ref 3.80–5.20)
RDW: 18.2 % — AB (ref 11.5–14.5)
WBC: 12.4 10*3/uL — ABNORMAL HIGH (ref 3.6–11.0)

## 2015-02-19 LAB — ABO/RH: ABO/RH(D): A POS

## 2015-02-19 LAB — RPR: RPR Ser Ql: NONREACTIVE

## 2015-02-19 MED ORDER — FERROUS SULFATE 325 (65 FE) MG PO TABS
325.0000 mg | ORAL_TABLET | Freq: Every day | ORAL | Status: DC
  Administered 2015-02-19 – 2015-02-20 (×2): 325 mg via ORAL
  Filled 2015-02-19 (×2): qty 1

## 2015-02-19 MED ORDER — IBUPROFEN 600 MG PO TABS
600.0000 mg | ORAL_TABLET | Freq: Four times a day (QID) | ORAL | Status: DC
  Administered 2015-02-19 – 2015-02-20 (×4): 600 mg via ORAL
  Filled 2015-02-19 (×4): qty 1

## 2015-02-19 MED ORDER — ONDANSETRON HCL 4 MG/2ML IJ SOLN: 4.0000 mg | INTRAMUSCULAR | Status: DC | PRN

## 2015-02-19 MED ORDER — OXYCODONE HCL 5 MG PO TABS
5.0000 mg | ORAL_TABLET | ORAL | Status: DC | PRN
  Administered 2015-02-19 – 2015-02-20 (×2): 5 mg via ORAL
  Filled 2015-02-19 (×2): qty 1

## 2015-02-19 MED ORDER — WITCH HAZEL-GLYCERIN EX PADS: 1.0000 "application " | MEDICATED_PAD | CUTANEOUS | Status: DC | PRN

## 2015-02-19 MED ORDER — PRENATAL MULTIVITAMIN CH
1.0000 | ORAL_TABLET | Freq: Every day | ORAL | Status: DC
  Administered 2015-02-19: 1 via ORAL
  Filled 2015-02-19: qty 1

## 2015-02-19 MED ORDER — BENZOCAINE-MENTHOL 20-0.5 % EX AERO: 1.0000 "application " | INHALATION_SPRAY | CUTANEOUS | Status: DC | PRN

## 2015-02-19 MED ORDER — ONDANSETRON HCL 4 MG PO TABS
4.0000 mg | ORAL_TABLET | ORAL | Status: DC | PRN
  Administered 2015-02-20: 4 mg via ORAL
  Filled 2015-02-19: qty 1

## 2015-02-19 MED ORDER — DIBUCAINE 1 % RE OINT: 1.0000 "application " | TOPICAL_OINTMENT | RECTAL | Status: DC | PRN

## 2015-02-19 MED ORDER — SIMETHICONE 80 MG PO CHEW: 80.0000 mg | CHEWABLE_TABLET | ORAL | Status: DC | PRN

## 2015-02-19 MED ORDER — DOCUSATE SODIUM 100 MG PO CAPS
100.0000 mg | ORAL_CAPSULE | Freq: Every day | ORAL | Status: DC
  Administered 2015-02-19 – 2015-02-20 (×2): 100 mg via ORAL
  Filled 2015-02-19 (×2): qty 1

## 2015-02-19 NOTE — Progress Notes (Addendum)
  Postpartum Day 1  Subjective: up ad lib, voiding and tolerating PO  Objective: Blood pressure 110/53, pulse 81, temperature 99 F (37.2 C), temperature source Oral, resp. rate 16, height  (1.549 m), weight 174 lb (78.926 kg), last menstrual period 05/08/2014, SpO2 98 %,   Physical Exam:  General: alert and cooperative Lochia: appropriate Uterine Fundus: firm Incision: N/A DVT Evaluation: No evidence of DVT seen on physical exam. Abdomen: soft, NT   Recent Labs  02/18/15 0951 02/19/15 0639  HGB 11.6* 9.2*  HCT 34.2* 27.7*    Assessment PPD #1, asymptomatic blood loss anemia  Plan: Continue PP care, Advance activity as tolerated and Fe replacement, anemia precautions  Feeding: bottle Contraception: nexplanon or Depo Blood Type: A+ RI/VI TDAP UTD Flu -need to ask pt    Marta Antu, CNM 02/19/2015, 9:22 AM

## 2015-02-19 NOTE — Anesthesia Post-op Follow-up Note (Signed)
  Anesthesia Pain Follow-up Note  Patient: Katelyn Mendoza  Day #: 1  Date of Follow-up: 02/19/2015 Time: 7:27 AM  Last Vitals:  Filed Vitals:   02/19/15 0100 02/19/15 0346  BP: 117/65 120/69  Pulse: 72 75  Temp: 37.5 C 37.7 C  Resp: 18 18    Level of Consciousness: alert  Pain: mild   Side Effects:None  Catheter Site Exam:site not evaluated  Plan: D/C from anesthesia care  Clydene Pugh

## 2015-02-19 NOTE — Anesthesia Postprocedure Evaluation (Signed)
Anesthesia Post Note  Patient: Katelyn Mendoza  Procedure(s) Performed: * No procedures listed *  Patient location during evaluation: Mother Baby Anesthesia Type: Epidural Level of consciousness: awake and alert and oriented Pain management: satisfactory to patient Vital Signs Assessment: post-procedure vital signs reviewed and stable Respiratory status: respiratory function stable Cardiovascular status: stable Postop Assessment: no backache and no headache Anesthetic complications: no    Last Vitals:  Filed Vitals:   02/19/15 0100 02/19/15 0346  BP: 117/65 120/69  Pulse: 72 75  Temp: 37.5 C 37.7 C  Resp: 18 18    Last Pain:  Filed Vitals:   02/19/15 0347  PainSc: 2                  Clydene Pugh

## 2015-02-19 NOTE — Discharge Summary (Signed)
Physician Obstetric Discharge Summary  Patient ID: Katelyn Mendoza MRN: [id number] DOB/AGE: 1990/07/24 24 y.o.   Date of Admission: 02/18/2015  Date of Discharge: 02/20/15  Admitting Diagnosis: Premature rupture of membrane at [redacted]w[redacted]d  Secondary Diagnosis: n/a  Mode of Delivery: vacuum-assisted vaginal delivery 02/18/2015  For variable decelerations     Discharge Diagnosis: postpartum s/p VAVD, mild acute blood loss anemia   Intrapartum Procedures: epidural, GBS prophylaxis, pitocin augmentation, placement of intrauterine catheter and vacuum assisted vaginal delivery   Post partum procedures: none  Complications: none   Brief Hospital Course  Katelyn Mendoza is a [id number] who had a VAD on 02/18/2015;  for further details of this delivery, please refer to the delivery note.  Patient had an uncomplicated postpartum course.  By time of discharge on PPD#2, her pain was controlled on oral pain medications; she had appropriate lochia and was ambulating, voiding without difficulty and tolerating regular diet.  She was deemed stable for discharge to home.    Labs: CBC Latest Ref Rng 02/18/2015 01/30/2015 12/09/2014  WBC 3.6 - 11.0 K/uL 8.4 10.2 9.5  Hemoglobin 12.0 - 16.0 g/dL 11.6(L) 12.1 11.4(L)  Hematocrit 35.0 - 47.0 % 34.2(L) 35.8 34.9(L)  Platelets 150 - 440 K/uL 221 281 254   PP Hct: 27.7  A POS/ RI/ VI/ GBS negative. TDAP UTD  Physical exam:  Blood pressure 129/77, pulse 84, temperature 98 F (36.7 C), temperature source Oral, resp. rate 16, height  (1.549 m), weight 78.926 kg (174 lb), last menstrual period 05/08/2014, SpO2 100 %. General: alert and no distress Lochia: appropriate Abdomen: soft, NT Uterine Fundus: firm Extremities: No evidence of DVT seen on physical exam. No lower extremity edema.  Discharge Instructions:    Call office if you have any of the following: headache, visual changes, fever >100 F, chills, breast concerns, excessive vaginal bleeding, incision  drainage or problems, leg pain or redness, depression or any other concerns.   Activity: Do not lift > 10 lbs for 6 weeks.  No intercourse or tampons for 6 weeks.  No driving for 1-2 weeks.    Activity: Advance as tolerated. Pelvic rest for 6 weeks.  Also refer to Discharge Instructions Diet: Regular Medications:    Medication List    STOP taking these medications        cephALEXin 250 MG capsule  Commonly known as:  KEFLEX     metoCLOPramide 10 MG tablet  Commonly known as:  REGLAN      TAKE these medications        ferrous sulfate 325 (65 FE) MG tablet  Take 1 tablet (325 mg total) by mouth daily with breakfast.     ibuprofen 600 MG tablet  Commonly known as:  ADVIL,MOTRIN  Take 1 tablet (600 mg total) by mouth every 6 (six) hours.     oxyCODONE-acetaminophen 5-325 MG tablet  Commonly known as:  PERCOCET/ROXICET  Take 1-2 tablets by mouth every 8 (eight) hours as needed for severe pain.     prenatal multivitamin Tabs tablet  Take 1 tablet by mouth daily at 12 noon.     valACYclovir 500 MG tablet  Commonly known as:  VALTREX  Take 1 tablet (500 mg total) by mouth daily.        Outpatient follow up: 6 weeks postpartum visit with Farrel Conners, CNM Postpartum contraception: Nexplanon- go by office to order  Discharged Condition: good  Discharged to: home   Newborn Data: Disposition:home with mother  Apgars: APGAR (  1 MIN): 7   APGAR (5 MINS): 9   APGAR (10 MINS):    Baby Feeding: Bottle / 6#11 oz/ [employer]

## 2015-02-20 MED ORDER — VALACYCLOVIR HCL 500 MG PO TABS: 500.0000 mg | ORAL_TABLET | Freq: Every day | ORAL | Status: DC

## 2015-02-20 MED ORDER — OXYCODONE-ACETAMINOPHEN 5-325 MG PO TABS: 1.0000 | ORAL_TABLET | Freq: Three times a day (TID) | ORAL | Status: DC | PRN

## 2015-02-20 MED ORDER — IBUPROFEN 600 MG PO TABS: 600.0000 mg | ORAL_TABLET | Freq: Four times a day (QID) | ORAL | Status: DC

## 2015-02-20 MED ORDER — FERROUS SULFATE 325 (65 FE) MG PO TABS: 325.0000 mg | ORAL_TABLET | Freq: Every day | ORAL | Status: DC

## 2015-02-20 NOTE — Progress Notes (Signed)
Discharge instructions complete and prescriptions given. Patient verbalizes understanding of teaching. Patient discharged home at 1343.

## 2015-02-20 NOTE — Discharge Instructions (Signed)
Discharge instructions:  ° °Call office if you have any of the following: headache, visual changes, fever >100 F, chills, breast concerns, excessive vaginal bleeding, incision drainage or problems, leg pain or redness, depression or any other concerns.  ° °Activity: Do not lift > 10 lbs for 6 weeks.  °No intercourse or tampons for 6 weeks.  °No driving for 1-2 weeks.  ° °

## 2015-04-02 ENCOUNTER — Encounter: Payer: Self-pay | Admitting: General Surgery

## 2015-04-16 ENCOUNTER — Ambulatory Visit: Payer: Self-pay | Admitting: General Surgery

## 2015-05-09 ENCOUNTER — Encounter: Payer: Self-pay | Admitting: *Deleted

## 2015-10-12 ENCOUNTER — Encounter: Payer: Self-pay | Admitting: Emergency Medicine

## 2015-10-12 ENCOUNTER — Emergency Department
Admission: EM | Admit: 2015-10-12 | Discharge: 2015-10-12 | Disposition: A | Discharge status: Home / Self Care | Payer: Medicaid Other | Attending: Emergency Medicine | Admitting: Emergency Medicine

## 2015-10-12 DIAGNOSIS — L309 Dermatitis, unspecified: Secondary | ICD-10-CM | POA: Insufficient documentation

## 2015-10-12 LAB — POCT PREGNANCY, URINE: Preg Test, Ur: NEGATIVE

## 2015-10-12 MED ORDER — DESOXIMETASONE 0.25 % EX CREA: 1.0000 "application " | TOPICAL_CREAM | Freq: Two times a day (BID) | CUTANEOUS | 0 refills | Status: DC

## 2015-10-12 MED ORDER — HYDROXYZINE HCL 50 MG PO TABS: 50.0000 mg | ORAL_TABLET | Freq: Three times a day (TID) | ORAL | 0 refills | Status: DC | PRN

## 2015-10-12 NOTE — ED Provider Notes (Signed)
[facility] Emergency Department Provider Note   ____________________________________________   None    (approximate)  I have reviewed the triage vital signs and the nursing notes.   HISTORY  Chief Complaint Rash    HPI Katelyn Mendoza is a 25 y.o. female patient complaining a rash of extremities for 1 month. Patient states she went to health department but they cannot give her diagnosis or treatment plan. Patient state rash is similar to her eczema but she had as a child. Patient states she's been using over-the-counter creams which give her some transient relief. Patient state itching is not relieved with over-the-counter creams. Patient denies pain with this complaint.  Past Medical History:  Diagnosis Date  . Gallstones   . Mental disorder    bipolar disorder dx'd at age 25  . Sickle cell trait (HCC)   . Urinary tract infection affecting care of mother, antepartum     Patient Active Problem List   Diagnosis Date Noted  . Premature rupture of membranes 02/18/2015  . Headache in pregnancy, antepartum 01/30/2015  . Labor and delivery indication for care or intervention 11/24/2014  . Vulvovaginal pruritus 11/24/2014    Past Surgical History:  Procedure Laterality Date  . NO PAST SURGERIES      Prior to Admission medications   Medication Sig Start Date End Date Taking? Authorizing Provider  desoximetasone (TOPICORT) 0.25 % cream Apply 1 application topically 2 (two) times daily. 10/12/15   Joni Reiningonald K Aliscia Clayton, PA-C  ferrous sulfate 325 (65 FE) MG tablet Take 1 tablet (325 mg total) by mouth daily with breakfast. 02/20/15   Marta Antuamara Brothers, CNM  hydrOXYzine (ATARAX/VISTARIL) 50 MG tablet Take 1 tablet (50 mg total) by mouth 3 (three) times daily as needed. 10/12/15   Joni Reiningonald K Chanan Detwiler, PA-C  ibuprofen (ADVIL,MOTRIN) 600 MG tablet Take 1 tablet (600 mg total) by mouth every 6 (six) hours. 02/20/15   Marta Antuamara Brothers, CNM  oxyCODONE-acetaminophen  (PERCOCET/ROXICET) 5-325 MG tablet Take 1-2 tablets by mouth every 8 (eight) hours as needed for severe pain. 02/20/15   Marta Antuamara Brothers, CNM  Prenatal Vit-Fe Fumarate-FA (PRENATAL MULTIVITAMIN) TABS tablet Take 1 tablet by mouth daily at 12 noon.    Historical Provider, MD  valACYclovir (VALTREX) 500 MG tablet Take 1 tablet (500 mg total) by mouth daily. 02/20/15   Marta Antuamara Brothers, CNM    Allergies Review of patient's allergies indicates no known allergies.  History reviewed. No pertinent family history.  Social History Social History  Substance Use Topics  . Smoking status: Never Smoker  . Smokeless tobacco: Not on file  . Alcohol use No    Review of Systems Constitutional: No fever/chills Eyes: No visual changes. ENT: No sore throat. Cardiovascular: Denies chest pain. Respiratory: Denies shortness of breath. Gastrointestinal: No abdominal pain.  No nausea, no vomiting.  No diarrhea.  No constipation. Genitourinary: Negative for dysuria. Musculoskeletal: Negative for back pain. Skin: Positive for rash. Neurological: Negative for headaches, focal weakness or numbness.     ____________________________________________   PHYSICAL EXAM:  VITAL SIGNS: ED Triage Vitals  Enc Vitals Group     BP 10/12/15 1709 (!) 125/59     Pulse Rate 10/12/15 1709 76     Resp 10/12/15 1709 18     Temp 10/12/15 1709 99 F (37.2 C)     Temp Source 10/12/15 1709 Oral     SpO2 10/12/15 1709 99 %     Weight 10/12/15 1708 153 lb (69.4 kg)  Height 10/12/15 1708 5\' 1"  (1.549 m)     Head Circumference --      Peak Flow --      Pain Score --      Pain Loc --      Pain Edu? --      Excl. in GC? --     Constitutional: Alert and oriented. Well appearing and in no acute distress. Eyes: Conjunctivae are normal. PERRL. EOMI. Head: Atraumatic. Nose: No congestion/rhinnorhea. Mouth/Throat: Mucous membranes are moist.  Oropharynx non-erythematous. Neck: No stridor.  No cervical spine tenderness to  palpation. Hematological/Lymphatic/Immunilogical: No cervical lymphadenopathy. Cardiovascular: Normal rate, regular rhythm. Grossly normal heart sounds.  Good peripheral circulation. Respiratory: Normal respiratory effort.  No retractions. Lungs CTAB. Gastrointestinal: Soft and nontender. No distention. No abdominal bruits. No CVA tenderness. Musculoskeletal: No lower extremity tenderness nor edema.  No joint effusions. Neurologic:  Normal speech and language. No gross focal neurologic deficits are appreciated. No gait instability. Skin:  Skin is warm, dry and intact. Macular thick lesions bilateral arms with signs and symptoms of excoriation. No signs symptoms secondary infection. Psychiatric: Mood and affect are normal. Speech and behavior are normal.  ____________________________________________   LABS (all labs ordered are listed, but only abnormal results are displayed)  Labs Reviewed  POC URINE PREG, ED  POCT PREGNANCY, URINE   ____________________________________________  EKG   ____________________________________________  RADIOLOGY   ____________________________________________   PROCEDURES  Procedure(s) performed: None  Procedures  Critical Care performed: No  ____________________________________________   INITIAL IMPRESSION / ASSESSMENT AND PLAN / ED COURSE  Pertinent labs & imaging results that were available during my care of the patient were reviewed by me and considered in my medical decision making (see chart for details).  Eczema. Patient given discharge care instructions. Patient given a prescription for Topicort and Atarax. Patient advised follow-up with family doctor for continued care.  Clinical Course     ____________________________________________   FINAL CLINICAL IMPRESSION(S) / ED DIAGNOSES  Final diagnoses:  Eczema      NEW MEDICATIONS STARTED DURING THIS VISIT:  New Prescriptions   DESOXIMETASONE (TOPICORT) 0.25 % CREAM     Apply 1 application topically 2 (two) times daily.   HYDROXYZINE (ATARAX/VISTARIL) 50 MG TABLET    Take 1 tablet (50 mg total) by mouth 3 (three) times daily as needed.     Note:  This document was prepared using Dragon voice recognition software and may include unintentional dictation errors.    Joni Reining, PA-C 10/12/15 1753    Emily Filbert, MD 10/20/15 [phone]

## 2015-10-12 NOTE — ED Triage Notes (Signed)
Pt reports rash to arms and bottom for 1 month. Went to health dept but reports they couldn't tell her anything. No sx r/t rash.

## 2015-10-12 NOTE — ED Notes (Signed)
Pt verbalized understanding of discharge instructions. NAD at this time. 

## 2015-10-12 NOTE — ED Triage Notes (Signed)
Pt has no sx r/t pregnancy but wants to make sure she is not pregnant. UPT ordered. Will obtain prior to sending to [facility].

## 2016-02-24 ENCOUNTER — Emergency Department
Admission: EM | Admit: 2016-02-24 | Discharge: 2016-02-24 | Disposition: A | Discharge status: Home / Self Care | Payer: Medicaid Other | Attending: Emergency Medicine | Admitting: Emergency Medicine

## 2016-02-24 DIAGNOSIS — Z79899 Other long term (current) drug therapy: Secondary | ICD-10-CM | POA: Insufficient documentation

## 2016-02-24 DIAGNOSIS — J069 Acute upper respiratory infection, unspecified: Secondary | ICD-10-CM

## 2016-02-24 MED ORDER — GUAIFENESIN-CODEINE 100-10 MG/5ML PO SYRP: 5.0000 mL | ORAL_SOLUTION | Freq: Three times a day (TID) | ORAL | 0 refills | Status: DC | PRN

## 2016-02-24 NOTE — ED Notes (Signed)
See triage note. States she developed fever body aches with cough about 3-4 days ago   Afebrile on arrival

## 2016-02-24 NOTE — Discharge Instructions (Signed)
Follow up with the primary care provider of your choice for symptoms that are not improving over the next few days.  Return to the ER for symptoms that change or worsen if unable to schedule an appointment. 

## 2016-02-24 NOTE — ED Provider Notes (Signed)
[facility] Emergency Department Provider Note  ____________________________________________  Time seen: Approximately 10:35 AM  I have reviewed the triage vital signs and the nursing notes.   HISTORY  Chief Complaint Cough   HPI Katelyn Mendoza is a 26 y.o. female who presents to the emergency department for evaluation of cough, chest congestion, and body aches x 3 days. She has not taken anything OTC.    Past Medical History:  Diagnosis Date  . Gallstones   . Mental disorder    bipolar disorder dx'd at age 33  . Sickle cell trait (HCC)   . Urinary tract infection affecting care of mother, antepartum     Patient Active Problem List   Diagnosis Date Noted  . Premature rupture of membranes 02/18/2015  . Headache in pregnancy, antepartum 01/30/2015  . Labor and delivery indication for care or intervention 11/24/2014  . Vulvovaginal pruritus 11/24/2014    Past Surgical History:  Procedure Laterality Date  . NO PAST SURGERIES      Prior to Admission medications   Medication Sig Start Date End Date Taking? Authorizing Provider  ferrous sulfate 325 (65 FE) MG tablet Take 1 tablet (325 mg total) by mouth daily with breakfast. 02/20/15   Marta Antu, CNM  guaiFENesin-codeine (ROBITUSSIN AC) 100-10 MG/5ML syrup Take 5 mLs by mouth 3 (three) times daily as needed for cough. 02/24/16   Chinita Pester, FNP  hydrOXYzine (ATARAX/VISTARIL) 50 MG tablet Take 1 tablet (50 mg total) by mouth 3 (three) times daily as needed. 10/12/15   Joni Reining, PA-C  ibuprofen (ADVIL,MOTRIN) 600 MG tablet Take 1 tablet (600 mg total) by mouth every 6 (six) hours. 02/20/15   Marta Antu, CNM  oxyCODONE-acetaminophen (PERCOCET/ROXICET) 5-325 MG tablet Take 1-2 tablets by mouth every 8 (eight) hours as needed for severe pain. 02/20/15   Marta Antu, CNM  Prenatal Vit-Fe Fumarate-FA (PRENATAL MULTIVITAMIN) TABS tablet Take 1 tablet by mouth daily at 12 noon.    Historical  Provider, MD  valACYclovir (VALTREX) 500 MG tablet Take 1 tablet (500 mg total) by mouth daily. 02/20/15   Marta Antu, CNM    Allergies Patient has no known allergies.  No family history on file.  Social History Social History  Substance Use Topics  . Smoking status: Never Smoker  . Smokeless tobacco: Not on file  . Alcohol use No    Review of Systems Constitutional: Negative for fever/chills ENT: Positive for sore throat. Cardiovascular: Denies chest pain. Respiratory: Negative for shortness of breath. Positive for cough. Gastrointestinal: Negative for nausea,  no vomiting.  Negative for diarrhea.  Musculoskeletal: Positive for body aches Skin: Negative for rash. Neurological: Negative for headaches ____________________________________________   PHYSICAL EXAM:  VITAL SIGNS: ED Triage Vitals [02/24/16 1013]  Enc Vitals Group     BP 107/61     Pulse Rate 86     Resp 17     Temp 98.7 F (37.1 C)     Temp Source Oral     SpO2 97 %     Weight 153 lb (69.4 kg)     Height 5\' 1"  (1.549 m)     Head Circumference      Peak Flow      Pain Score 6     Pain Loc      Pain Edu?      Excl. in GC?     Constitutional: Alert and oriented. Well appearing and in no acute distress. Eyes: Conjunctivae are normal. EOMI. Ears:  Bilateral TM normal Nose: No congestion; clear rhinnorhea. Mouth/Throat: Mucous membranes are moist.  Oropharynx mildly erythematous. Tonsils appear 2+ without exudate. Neck: No stridor.  Lymphatic: Bilateral anterior cervical lymphadenopathy. Cardiovascular: Normal rate, regular rhythm. Grossly normal heart sounds.  Good peripheral circulation. Respiratory: Normal respiratory effort.  No retractions. Breath sounds clear. Gastrointestinal: Soft and nontender.  Musculoskeletal: FROM x 4 extremities.  Neurologic:  Normal speech and language.  Skin:  Skin is warm, dry and intact. No rash noted. Psychiatric: Mood and affect are normal. Speech and behavior  are normal.  ____________________________________________   LABS (all labs ordered are listed, but only abnormal results are displayed)  Labs Reviewed - No data to display ____________________________________________  EKG ____________________________________________  RADIOLOGY  Not indicated. ____________________________________________   PROCEDURES  Procedure(s) performed: None  Critical Care performed: No  ____________________________________________   INITIAL IMPRESSION / ASSESSMENT AND PLAN / ED COURSE     Pertinent labs & imaging results that were available during my care of the patient were reviewed by me and considered in my medical decision making (see chart for details).   26 year old female presenting to the ER for treatment of viral URI symptoms. She will be treated with Robitussin AC and advised to follow up with the PCP of her choice for symptoms that are not improving over the next few days. She was advised to return to the ER for symptoms that change or worsen if unable to schedule an appointment. ____________________________________________   FINAL CLINICAL IMPRESSION(S) / ED DIAGNOSES  Final diagnoses:  Viral upper respiratory tract infection    Note:  This document was prepared using Dragon voice recognition software and may include unintentional dictation errors.     Chinita PesterCari B Caydin Yeatts, FNP 02/24/16 1051    Rockne MenghiniAnne-Caroline Norman, MD 02/24/16 1524

## 2016-02-24 NOTE — ED Triage Notes (Signed)
Pt reports cough, chest congestion and body aches x3 days.

## 2016-04-12 ENCOUNTER — Encounter: Payer: Self-pay | Admitting: Emergency Medicine

## 2016-04-12 ENCOUNTER — Emergency Department
Admission: EM | Admit: 2016-04-12 | Discharge: 2016-04-12 | Disposition: A | Discharge status: Home / Self Care | Payer: Self-pay | Attending: Emergency Medicine | Admitting: Emergency Medicine

## 2016-04-12 DIAGNOSIS — Z5321 Procedure and treatment not carried out due to patient leaving prior to being seen by health care provider: Secondary | ICD-10-CM | POA: Insufficient documentation

## 2016-04-12 DIAGNOSIS — R112 Nausea with vomiting, unspecified: Secondary | ICD-10-CM | POA: Insufficient documentation

## 2016-04-12 LAB — COMPREHENSIVE METABOLIC PANEL
ALT: 14 U/L (ref 14–54)
ANION GAP: 7 (ref 5–15)
AST: 19 U/L (ref 15–41)
Albumin: 4.5 g/dL (ref 3.5–5.0)
Alkaline Phosphatase: 60 U/L (ref 38–126)
BUN: 10 mg/dL (ref 6–20)
CALCIUM: 9.2 mg/dL (ref 8.9–10.3)
CHLORIDE: 102 mmol/L (ref 101–111)
CO2: 27 mmol/L (ref 22–32)
CREATININE: 0.71 mg/dL (ref 0.44–1.00)
Glucose, Bld: 104 mg/dL — ABNORMAL HIGH (ref 65–99)
Potassium: 3.1 mmol/L — ABNORMAL LOW (ref 3.5–5.1)
SODIUM: 136 mmol/L (ref 135–145)
Total Bilirubin: 1.4 mg/dL — ABNORMAL HIGH (ref 0.3–1.2)
Total Protein: 8.4 g/dL — ABNORMAL HIGH (ref 6.5–8.1)

## 2016-04-12 LAB — URINALYSIS, COMPLETE (UACMP) WITH MICROSCOPIC
BILIRUBIN URINE: NEGATIVE
Bacteria, UA: NONE SEEN
GLUCOSE, UA: NEGATIVE mg/dL
HGB URINE DIPSTICK: NEGATIVE
KETONES UR: NEGATIVE mg/dL
LEUKOCYTES UA: NEGATIVE
Nitrite: NEGATIVE
PH: 5 (ref 5.0–8.0)
PROTEIN: 30 mg/dL — AB
Specific Gravity, Urine: 1.023 (ref 1.005–1.030)

## 2016-04-12 LAB — POCT PREGNANCY, URINE: Preg Test, Ur: NEGATIVE

## 2016-04-12 LAB — CBC
HCT: 41.1 % (ref 35.0–47.0)
HEMOGLOBIN: 13.9 g/dL (ref 12.0–16.0)
MCH: 26.4 pg (ref 26.0–34.0)
MCHC: 33.8 g/dL (ref 32.0–36.0)
MCV: 78.3 fL — ABNORMAL LOW (ref 80.0–100.0)
PLATELETS: 298 10*3/uL (ref 150–440)
RBC: 5.25 MIL/uL — AB (ref 3.80–5.20)
RDW: 14.6 % — ABNORMAL HIGH (ref 11.5–14.5)
WBC: 5.7 10*3/uL (ref 3.6–11.0)

## 2016-04-12 LAB — LIPASE, BLOOD

## 2016-04-12 NOTE — ED Triage Notes (Signed)
Pt reports N/V/D since yesterday, reports multiple episodes of both. Denies any abdominal pain. Reports she's unable to keep fluids down.

## 2016-04-13 ENCOUNTER — Telehealth: Payer: Self-pay | Admitting: Emergency Medicine

## 2016-04-13 NOTE — Telephone Encounter (Signed)
Called patient due to lwot to inquire about condition and follow up plans. Says she is still unable to keep food down.  Has not eaten in 4 hours and is going to try soon.  I explained that she should have provider exam . She does not have pcp. I told her she could return or got to urgent care, but that her lab tests were complete.  She says she will wait a bit longer to see how she is and will return if she decides to get eexam.

## 2016-10-06 ENCOUNTER — Telehealth: Payer: Self-pay

## 2016-10-06 NOTE — Telephone Encounter (Signed)
Pt called triage line stating she is having side effects from the Nexplanon and would like it removed. She was wondering what she needed to do. CB# [phone]  Please schedule an appointment, first available, not work-in. Pt did not say who she sees as her provider. KJ CMA

## 2016-10-06 NOTE — Telephone Encounter (Signed)
Pt is showing in active for medicaid. Pt will call back to be schedule for removal of neplaxon

## 2016-10-10 ENCOUNTER — Encounter: Payer: Self-pay | Admitting: Emergency Medicine

## 2016-10-10 ENCOUNTER — Emergency Department
Admission: EM | Admit: 2016-10-10 | Discharge: 2016-10-10 | Disposition: A | Discharge status: Home / Self Care | Payer: Self-pay | Attending: Emergency Medicine | Admitting: Emergency Medicine

## 2016-10-10 DIAGNOSIS — Z79899 Other long term (current) drug therapy: Secondary | ICD-10-CM | POA: Insufficient documentation

## 2016-10-10 DIAGNOSIS — N898 Other specified noninflammatory disorders of vagina: Secondary | ICD-10-CM | POA: Insufficient documentation

## 2016-10-10 LAB — WET PREP, GENITAL
Clue Cells Wet Prep HPF POC: NONE SEEN
Sperm: NONE SEEN
Trich, Wet Prep: NONE SEEN
YEAST WET PREP: NONE SEEN

## 2016-10-10 LAB — URINALYSIS, ROUTINE W REFLEX MICROSCOPIC
Bilirubin Urine: NEGATIVE
Glucose, UA: NEGATIVE mg/dL
Hgb urine dipstick: NEGATIVE
Ketones, ur: NEGATIVE mg/dL
Nitrite: NEGATIVE
Protein, ur: NEGATIVE mg/dL
SPECIFIC GRAVITY, URINE: 1.011 (ref 1.005–1.030)
pH: 6 (ref 5.0–8.0)

## 2016-10-10 LAB — POCT PREGNANCY, URINE: PREG TEST UR: NEGATIVE

## 2016-10-10 LAB — CHLAMYDIA/NGC RT PCR (ARMC ONLY)
CHLAMYDIA TR: NOT DETECTED
N GONORRHOEAE: NOT DETECTED

## 2016-10-10 NOTE — Discharge Instructions (Signed)
Follow-up with the [facility] Department on Monday. He'll also be given a phone call if your test results here in the emergency department are positive.

## 2016-10-10 NOTE — ED Notes (Signed)
See triage note  Presents with some vaginal discharge and dysuria and freq  States she was notified  by health dept to come be treated for STD  Recently treated for BV at the HD

## 2016-10-10 NOTE — ED Provider Notes (Signed)
[facility] Emergency Department Provider Note  ___________________________________________   First MD Initiated Contact with Patient 10/10/16 1434     (approximate)  I have reviewed the triage vital signs and the nursing notes.   HISTORY  Chief Complaint Vaginal Discharge   HPI Katelyn Mendoza is a 26 y.o. female is here complaining of vaginal discharge. Patient states she was seen at the health Department on September 20 where she was treated for bacterial vaginosis at that time. She states that she received a letter in the mail this week stating that she needed to come to the health department for treatment of a questionable infection. She is not told specifically what type of infection. She states that she does not want to wait until Monday to find out what she has. She denies any fever or chills, nausea or vomiting, or abdominal pain.   Past Medical History:  Diagnosis Date  . Gallstones   . Mental disorder    bipolar disorder dx'd at age 72  . Sickle cell trait (HCC)   . Urinary tract infection affecting care of mother, antepartum     Patient Active Problem List   Diagnosis Date Noted  . Premature rupture of membranes 02/18/2015  . Headache in pregnancy, antepartum 01/30/2015  . Labor and delivery indication for care or intervention 11/24/2014  . Vulvovaginal pruritus 11/24/2014    Past Surgical History:  Procedure Laterality Date  . NO PAST SURGERIES      Prior to Admission medications   Medication Sig Start Date End Date Taking? Authorizing Provider  ferrous sulfate 325 (65 FE) MG tablet Take 1 tablet (325 mg total) by mouth daily with breakfast. 02/20/15   Marta Antu, CNM  Prenatal Vit-Fe Fumarate-FA (PRENATAL MULTIVITAMIN) TABS tablet Take 1 tablet by mouth daily at 12 noon.    [provider]  valACYclovir (VALTREX) 500 MG tablet Take 1 tablet (500 mg total) by mouth daily. 02/20/15   Marta Antu, CNM     Allergies Patient has no known allergies.  History reviewed. No pertinent family history.  Social History Social History  Substance Use Topics  . Smoking status: Never Smoker  . Smokeless tobacco: Never Used  . Alcohol use No    Review of Systems Constitutional: No fever/chills Cardiovascular: Denies chest pain. Respiratory: Denies shortness of breath. Gastrointestinal: No abdominal pain.  No nausea, no vomiting.  Genitourinary: Negative for dysuria.Positive vaginal discharge. Musculoskeletal: Negative for back pain. Skin: Negative for rash. Neurological: Negative for headaches, focal weakness or numbness. ___________________________________________   PHYSICAL EXAM:  VITAL SIGNS: ED Triage Vitals  Enc Vitals Group     BP 10/10/16 1326 (!) 110/53     Pulse Rate 10/10/16 1326 91     Resp 10/10/16 1326 18     Temp 10/10/16 1326 98.2 F (36.8 C)     Temp Source 10/10/16 1326 Oral     SpO2 10/10/16 1326 98 %     Weight 10/10/16 1326 149 lb (67.6 kg)     Height --      Head Circumference --      Peak Flow --      Pain Score 10/10/16 1325 0     Pain Loc --      Pain Edu? --      Excl. in GC? --    Constitutional: Alert and oriented. Well appearing and in no acute distress. Eyes: Conjunctivae are normal.  Head: Atraumatic. Nose: No congestion/rhinnorhea. Neck: No stridor.  Cardiovascular: Normal rate, regular rhythm. Grossly normal heart sounds.  Good peripheral circulation. Respiratory: Normal respiratory effort.  No retractions. Lungs CTAB. Gastrointestinal: Soft and nontender. No distention.  Genitourinary:  Moderate amount of white thick discharge noted. No irritation of the cervix. No adnexal masses or tenderness present. No cervical motion tenderness. Swabs were obtained. Musculoskeletal: Moves upper and lower extremities without any difficulty. Normal gait was noted. Neurologic:  Normal speech and language. No gross focal neurologic deficits are  appreciated.  Skin:  Skin is warm, dry and intact. No rash noted. Psychiatric: Mood and affect are normal. Speech and behavior are normal.  ____________________________________________   LABS (all labs ordered are listed, but only abnormal results are displayed)  Labs Reviewed  WET PREP, GENITAL - Abnormal; Notable for the following:       Result Value   WBC, Wet Prep HPF POC MODERATE (*)    All other components within normal limits  URINALYSIS, ROUTINE W REFLEX MICROSCOPIC - Abnormal; Notable for the following:    Color, Urine YELLOW (*)    APPearance HAZY (*)    Leukocytes, UA TRACE (*)    Bacteria, UA RARE (*)    Squamous Epithelial / LPF 6-30 (*)    All other components within normal limits  CHLAMYDIA/NGC RT PCR ([facility] ONLY)  POC URINE PREG, ED  POCT PREGNANCY, URINE     PROCEDURES  Procedure(s) performed: None  Procedures  Critical Care performed: No  ____________________________________________   INITIAL IMPRESSION / ASSESSMENT AND PLAN / ED COURSE  Pertinent labs & imaging results that were available during my care of the patient were reviewed by me and considered in my medical decision making (see chart for details).  Patient was made aware that prep in the department does not give any answers to what type of infection she has. Gonorrhea and chlamydia culture was still pending at this time for discharge. Patient was told that she would get up and call if this is positive and also encouraged to follow-up with the health department on Monday.    ___________________________________________   FINAL CLINICAL IMPRESSION(S) / ED DIAGNOSES  Final diagnoses:  Vaginal discharge      NEW MEDICATIONS STARTED DURING THIS VISIT:  Current Discharge Medication List       Note:  This document was prepared using Dragon voice recognition software and may include unintentional dictation errors.    Tommi Rumps, PA-C 10/10/16 1610    Merrily Brittle, MD 10/11/16 1055

## 2016-10-10 NOTE — ED Triage Notes (Signed)
Pt to ed with c/o vaginal d/c. Reports she was seen at health dept on Sept 20th,  Was treated for BV at that time.  States she received a letter in the mail this week saying that she needed to come back to health dept to be treated for ?infection.  Pt states she wants to get treatment today instead of going back to health dept on Monday.

## 2016-10-12 ENCOUNTER — Telehealth: Payer: Self-pay | Admitting: Emergency Medicine

## 2016-10-12 NOTE — Telephone Encounter (Signed)
Patient called asking for std results.  Gave her results and instructed to follow up with her doctor or [facility] for further care.

## 2017-04-22 LAB — HM PAP SMEAR: HM Pap smear: NEGATIVE

## 2017-06-18 ENCOUNTER — Emergency Department: Payer: Medicaid Other

## 2017-06-18 ENCOUNTER — Emergency Department
Admission: EM | Admit: 2017-06-18 | Discharge: 2017-06-18 | Disposition: A | Discharge status: Home / Self Care | Payer: Medicaid Other | Attending: Emergency Medicine | Admitting: Emergency Medicine

## 2017-06-18 ENCOUNTER — Other Ambulatory Visit: Payer: Self-pay

## 2017-06-18 DIAGNOSIS — R1013 Epigastric pain: Secondary | ICD-10-CM

## 2017-06-18 DIAGNOSIS — M542 Cervicalgia: Secondary | ICD-10-CM

## 2017-06-18 DIAGNOSIS — R110 Nausea: Secondary | ICD-10-CM | POA: Insufficient documentation

## 2017-06-18 DIAGNOSIS — Z79899 Other long term (current) drug therapy: Secondary | ICD-10-CM | POA: Insufficient documentation

## 2017-06-18 DIAGNOSIS — R1011 Right upper quadrant pain: Secondary | ICD-10-CM | POA: Insufficient documentation

## 2017-06-18 DIAGNOSIS — R1010 Upper abdominal pain, unspecified: Secondary | ICD-10-CM

## 2017-06-18 LAB — CBC WITH DIFFERENTIAL/PLATELET
Basophils Absolute: 0 10*3/uL (ref 0–0.1)
Basophils Relative: 1 %
Eosinophils Absolute: 0.1 10*3/uL (ref 0–0.7)
Eosinophils Relative: 1 %
HCT: 40.3 % (ref 35.0–47.0)
Hemoglobin: 13.8 g/dL (ref 12.0–16.0)
Lymphocytes Relative: 21 %
Lymphs Abs: 1.4 10*3/uL (ref 1.0–3.6)
MCH: 27 pg (ref 26.0–34.0)
MCHC: 34.1 g/dL (ref 32.0–36.0)
MCV: 79.1 fL — ABNORMAL LOW (ref 80.0–100.0)
Monocytes Absolute: 0.6 10*3/uL (ref 0.2–0.9)
Monocytes Relative: 9 %
Neutro Abs: 4.8 10*3/uL (ref 1.4–6.5)
Neutrophils Relative %: 68 %
Platelets: 339 10*3/uL (ref 150–440)
RBC: 5.1 MIL/uL (ref 3.80–5.20)
RDW: 14.3 % (ref 11.5–14.5)
WBC: 7 10*3/uL (ref 3.6–11.0)

## 2017-06-18 LAB — URINALYSIS, COMPLETE (UACMP) WITH MICROSCOPIC
BILIRUBIN URINE: NEGATIVE
Glucose, UA: NEGATIVE mg/dL
Hgb urine dipstick: NEGATIVE
Ketones, ur: NEGATIVE mg/dL
LEUKOCYTES UA: NEGATIVE
NITRITE: NEGATIVE
PH: 5 (ref 5.0–8.0)
Protein, ur: NEGATIVE mg/dL
SPECIFIC GRAVITY, URINE: 1.021 (ref 1.005–1.030)

## 2017-06-18 LAB — COMPREHENSIVE METABOLIC PANEL
ALK PHOS: 65 U/L (ref 38–126)
ALT: 14 U/L (ref 14–54)
ANION GAP: 5 (ref 5–15)
AST: 18 U/L (ref 15–41)
Albumin: 4.3 g/dL (ref 3.5–5.0)
BUN: 15 mg/dL (ref 6–20)
CALCIUM: 9.1 mg/dL (ref 8.9–10.3)
CHLORIDE: 105 mmol/L (ref 101–111)
CO2: 27 mmol/L (ref 22–32)
Creatinine, Ser: 0.72 mg/dL (ref 0.44–1.00)
Glucose, Bld: 107 mg/dL — ABNORMAL HIGH (ref 65–99)
Potassium: 3.7 mmol/L (ref 3.5–5.1)
SODIUM: 137 mmol/L (ref 135–145)
Total Bilirubin: 0.5 mg/dL (ref 0.3–1.2)
Total Protein: 7.8 g/dL (ref 6.5–8.1)

## 2017-06-18 LAB — PREGNANCY, URINE: Preg Test, Ur: NEGATIVE

## 2017-06-18 LAB — LIPASE, BLOOD: LIPASE: 25 U/L (ref 11–51)

## 2017-06-18 MED ORDER — GI COCKTAIL ~~LOC~~
30.0000 mL | Freq: Once | ORAL | Status: AC
  Administered 2017-06-18: 30 mL via ORAL
  Filled 2017-06-18: qty 30

## 2017-06-18 MED ORDER — ESOMEPRAZOLE MAGNESIUM 40 MG PO CPDR: 40.0000 mg | DELAYED_RELEASE_CAPSULE | Freq: Every day | ORAL | 1 refills | Status: DC

## 2017-06-18 MED ORDER — MORPHINE SULFATE (PF) 4 MG/ML IV SOLN
4.0000 mg | Freq: Once | INTRAVENOUS | Status: AC
  Administered 2017-06-18: 4 mg via INTRAVENOUS
  Filled 2017-06-18: qty 1

## 2017-06-18 MED ORDER — ONDANSETRON HCL 4 MG/2ML IJ SOLN
4.0000 mg | Freq: Once | INTRAMUSCULAR | Status: AC
  Administered 2017-06-18: 4 mg via INTRAVENOUS
  Filled 2017-06-18: qty 2

## 2017-06-18 MED ORDER — KETOROLAC TROMETHAMINE 30 MG/ML IJ SOLN
30.0000 mg | Freq: Once | INTRAMUSCULAR | Status: AC
  Administered 2017-06-18: 30 mg via INTRAVENOUS
  Filled 2017-06-18: qty 1

## 2017-06-18 NOTE — Discharge Instructions (Addendum)
Please use Tylenol or Motrin to help with the neck. You can take the Motrin 3 of the over-the-counter pills 3 times a day with food so as not to upset your stomach. I will also give you some Nexium or other acid blocker to help with your stomach.  If it is too expensive please have the pharmacy call us while you're there we can try something else. If thestomach pain gets worse or you develop a fever vomiting or any blood in the vomit or stool please return here immediately.

## 2017-06-18 NOTE — ED Notes (Addendum)
Pt in US and XR

## 2017-06-18 NOTE — ED Triage Notes (Signed)
Abdominal pain that began last night, NV. Neck pain this AM from laying on floor per patient report. Hx of gallstones. Pt alert and oriented X4, active, cooperative, pt in NAD. RR even and unlabored, color WNL.

## 2017-06-18 NOTE — ED Provider Notes (Signed)
[facility] Emergency Department Provider Note   ____________________________________________   First MD Initiated Contact with Patient 06/18/17 [phone]     (approximate)  I have reviewed the triage vital signs and the nursing notes.   HISTORY  Chief Complaint Abdominal Pain and Neck Pain   HPI Katelyn Mendoza is a 27 y.o. female Who complains of epigastric and right upper quadrant pain starting last night he feels like her gallbladder pain and also pain in her neck on the right side in the paraspinous muscles that started after she laid down on the floor for the belly pain. She reports she heard a pop there and then its been consented hurting more and more. She is now having trouble turning her neck because it sore and stiff. She did not injure it she says she just laid down on the floor and felt a pop.she is also complaining of some nausea.   Past Medical History:  Diagnosis Date  . Gallstones   . Mental disorder    bipolar disorder dx'd at age 77  . Sickle cell trait (HCC)   . Urinary tract infection affecting care of mother, antepartum     Patient Active Problem List   Diagnosis Date Noted  . Premature rupture of membranes 02/18/2015  . Headache in pregnancy, antepartum 01/30/2015  . Labor and delivery indication for care or intervention 11/24/2014  . Vulvovaginal pruritus 11/24/2014    Past Surgical History:  Procedure Laterality Date  . NO PAST SURGERIES      Prior to Admission medications   Medication Sig Start Date End Date Taking? Authorizing Provider  esomeprazole (NEXIUM) 40 MG capsule Take 1 capsule (40 mg total) by mouth daily. 06/18/17 06/18/18  Arnaldo Natal, MD  esomeprazole (NEXIUM) 40 MG capsule Take 1 capsule (40 mg total) by mouth daily. 06/18/17 06/18/18  Arnaldo Natal, MD  esomeprazole (NEXIUM) 40 MG capsule Take 1 capsule (40 mg total) by mouth daily. 06/18/17 06/18/18  Arnaldo Natal, MD  ferrous sulfate 325 (65 FE) MG tablet Take  1 tablet (325 mg total) by mouth daily with breakfast. 02/20/15   Marta Antu, CNM  Prenatal Vit-Fe Fumarate-FA (PRENATAL MULTIVITAMIN) TABS tablet Take 1 tablet by mouth daily at 12 noon.    [provider]  valACYclovir (VALTREX) 500 MG tablet Take 1 tablet (500 mg total) by mouth daily. 02/20/15   Marta Antu, CNM    Allergies Patient has no known allergies.  History reviewed. No pertinent family history.  Social History Social History   Tobacco Use  . Smoking status: Never Smoker  . Smokeless tobacco: Never Used  Substance Use Topics  . Alcohol use: No  . Drug use: No    Review of Systems  Constitutional: No fever/chills Eyes: No visual changes. ENT: No sore throat. Cardiovascular: Denies chest pain. Respiratory: Denies shortness of breath. Gastrointestinal: see history of present illness Genitourinary: Negative for dysuria. Musculoskeletal: Negative for back pain. Skin: Negative for rash. Neurological: Negative for headaches, focal weakness  ____________________________________________   PHYSICAL EXAM:  VITAL SIGNS: ED Triage Vitals [06/18/17 0855]  Enc Vitals Group     BP (!) 118/41     Pulse Rate 100     Resp 18     Temp 98.1 F (36.7 C)     Temp Source Oral     SpO2 99 %     Weight 166 lb (75.3 kg)     Height 5\' 1"  (1.549 m)  Head Circumference      Peak Flow      Pain Score 10     Pain Loc      Pain Edu?      Excl. in GC?     Constitutional: Alert and oriented. Well appearing and in no acute distress. Eyes: Conjunctivae are normal.  Head: Atraumatic. Nose: No congestion/rhinnorhea. Mouth/Throat: Mucous membranes are moist.  Oropharynx non-erythematous. Neck: No stridor.  Cardiovascular: Normal rate, regular rhythm. Grossly normal heart sounds.  Good peripheral circulation. Respiratory: Normal respiratory effort.  No retractions. Lungs CTAB. Gastrointestinal: Soft tender knee epigastric and right upper quadrant areas.. No  distention. No abdominal bruits. No CVA tenderness. Musculoskeletal: No lower extremity tenderness nor edema.  No joint effusions. Neurologic:  Normal speech and language. No gross focal neurologic deficits are appreciated. Skin:  Skin is warm, dry and intact. No rash noted. Psychiatric: Mood and affect are normal. Speech and behavior are normal.  ____________________________________________   LABS (all labs ordered are listed, but only abnormal results are displayed)  Labs Reviewed  COMPREHENSIVE METABOLIC PANEL - Abnormal; Notable for the following components:      Result Value   Glucose, Bld 107 (*)    All other components within normal limits  URINALYSIS, COMPLETE (UACMP) WITH MICROSCOPIC - Abnormal; Notable for the following components:   Color, Urine YELLOW (*)    APPearance HAZY (*)    Bacteria, UA RARE (*)    All other components within normal limits  CBC WITH DIFFERENTIAL/PLATELET - Abnormal; Notable for the following components:   MCV 79.1 (*)    All other components within normal limits  LIPASE, BLOOD  PREGNANCY, URINE   ____________________________________________  EKG   ____________________________________________  RADIOLOGY  ED MD interpretation:    Official radiology report(s): Dg Cervical Spine Complete  Result Date: 06/18/2017 CLINICAL DATA:  Cervicalgia EXAM: CERVICAL SPINE - COMPLETE 4+ VIEW COMPARISON:  None. FINDINGS: Frontal, lateral, open-mouth odontoid, and bilateral oblique views were obtained. There is no fracture or spondylolisthesis. Prevertebral soft tissues and predental space regions are normal. The disc spaces appear unremarkable. There is no appreciable exit foraminal narrowing on the oblique views. There is reversal of lordotic curvature.  Lung apices are clear. IMPRESSION: Relative reversal of lordotic curvature. Question a degree of muscle spasm. No fracture or spondylolisthesis. No appreciable arthropathy. Electronically Signed   By:  Bretta Bang III M.D.   On: 06/18/2017 09:45   US Abdomen Limited Ruq  Result Date: 06/18/2017 CLINICAL DATA:  Epigastric pain in the right upper quadrant. EXAM: ULTRASOUND ABDOMEN LIMITED RIGHT UPPER QUADRANT COMPARISON:  12/09/2014 FINDINGS: Gallbladder: The gallbladder could not be identified by the technologist. It is possible there could be a contracted stone filled gallbladder, but that would usually be visible. No Murphy sign. Common bile duct: Diameter: 3 mm common normal Liver: No focal lesion identified. Within normal limits in parenchymal echogenicity. Portal vein is patent on color Doppler imaging with normal direction of blood flow towards the liver. IMPRESSION: Gallbladder is not identified by the sonographer on today's study. Previously, the gallbladder was easily visible and was seen to contain a few small stones. This is somewhat hard to explain, but the gallbladder could be contracted or stone filled. Usually, a stone filled gallbladder will be visible as a shadowing structure. Otherwise, the liver parenchyma appears normal. There is no intra or extrahepatic ductal dilatation. No Murphy sign. Electronically Signed   By: Paulina Fusi M.D.   On: 06/18/2017 10:09  ____________________________________________   PROCEDURES  Procedure(s) performed:   Procedures  Critical Care performed:   ____________________________________________   INITIAL IMPRESSION / ASSESSMENT AND PLAN / ED COURSE   patient much better after medication with IV Toradol and the GI cocktail.. I'll let her take Motrin for her neck and Nexium for her stomach. Ultrasound did not show anything in the area of the gallbladder.        ____________________________________________   FINAL CLINICAL IMPRESSION(S) / ED DIAGNOSES  Final diagnoses:  Neck pain  Pain of upper abdomen     ED Discharge Orders        Ordered    esomeprazole (NEXIUM) 40 MG capsule  Daily     06/18/17 1130     esomeprazole (NEXIUM) 40 MG capsule  Daily     06/18/17 1132    esomeprazole (NEXIUM) 40 MG capsule  Daily     06/18/17 1134       Note:  This document was prepared using Dragon voice recognition software and may include unintentional dictation errors.    Arnaldo NatalMalinda, Taylen Osorto F, MD 06/18/17 [phone]

## 2017-06-18 NOTE — ED Notes (Signed)
DC instructions discussed, pt verbalized understanding of prescription and follow up.  Pt called boyfriend for a ride.  Pt ambulatory to the lobby. Pt reports improvement in neck pain, but states it is still sore.  Pt instructed to use OTC medications for pain relief.

## 2017-06-18 NOTE — ED Notes (Signed)
Pt asking for drink and snack, informed her that we needed to wait until results were back. Lights dimmed for patient comfort.  Pt agreeable with plan.

## 2017-06-18 NOTE — ED Notes (Signed)
Returned from U/S

## 2017-09-03 ENCOUNTER — Emergency Department
Admission: EM | Admit: 2017-09-03 | Discharge: 2017-09-03 | Disposition: A | Discharge status: Home / Self Care | Payer: Medicaid Other | Attending: Emergency Medicine | Admitting: Emergency Medicine

## 2017-09-03 ENCOUNTER — Encounter: Payer: Self-pay | Admitting: Emergency Medicine

## 2017-09-03 DIAGNOSIS — S39012A Strain of muscle, fascia and tendon of lower back, initial encounter: Secondary | ICD-10-CM | POA: Insufficient documentation

## 2017-09-03 DIAGNOSIS — Y999 Unspecified external cause status: Secondary | ICD-10-CM | POA: Insufficient documentation

## 2017-09-03 DIAGNOSIS — Y9389 Activity, other specified: Secondary | ICD-10-CM | POA: Insufficient documentation

## 2017-09-03 DIAGNOSIS — Z79899 Other long term (current) drug therapy: Secondary | ICD-10-CM | POA: Insufficient documentation

## 2017-09-03 DIAGNOSIS — Y92410 Unspecified street and highway as the place of occurrence of the external cause: Secondary | ICD-10-CM | POA: Insufficient documentation

## 2017-09-03 MED ORDER — CYCLOBENZAPRINE HCL 10 MG PO TABS: 10.0000 mg | ORAL_TABLET | Freq: Three times a day (TID) | ORAL | 0 refills | Status: DC | PRN

## 2017-09-03 MED ORDER — TRAMADOL HCL 50 MG PO TABS: 50.0000 mg | ORAL_TABLET | Freq: Two times a day (BID) | ORAL | 0 refills | Status: DC | PRN

## 2017-09-03 MED ORDER — IBUPROFEN 600 MG PO TABS: 600.0000 mg | ORAL_TABLET | Freq: Three times a day (TID) | ORAL | 0 refills | Status: DC | PRN

## 2017-09-03 MED ORDER — IBUPROFEN 600 MG PO TABS
600.0000 mg | ORAL_TABLET | Freq: Once | ORAL | Status: AC
Start: 2017-09-03 — End: 2017-09-03
  Administered 2017-09-03: 600 mg via ORAL
  Filled 2017-09-03: qty 1

## 2017-09-03 MED ORDER — CYCLOBENZAPRINE HCL 10 MG PO TABS
10.0000 mg | ORAL_TABLET | Freq: Once | ORAL | Status: AC
  Administered 2017-09-03: 10 mg via ORAL
  Filled 2017-09-03: qty 1
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
## 2017-09-24 ENCOUNTER — Telehealth: Payer: Self-pay

## 2017-09-24 NOTE — Telephone Encounter (Signed)
Pt calling to see when her nexplanon is due to come out.  [phone]  Adv 04/04/2018.  She apparently is still having issues with it like bleeding.  The HD gave her bcp to take as well but she hasn't started them yet.  She would like to schedule removal.  Tx'd to [facility] to sched.  Had pap this year at HD and says it's normal.

## 2017-09-27 ENCOUNTER — Encounter: Payer: Self-pay | Admitting: Obstetrics & Gynecology

## 2017-09-27 ENCOUNTER — Ambulatory Visit (INDEPENDENT_AMBULATORY_CARE_PROVIDER_SITE_OTHER): Payer: Medicaid Other | Admitting: Obstetrics & Gynecology

## 2017-09-27 VITALS — BP 100/60 | Ht 61.0 in | Wt 163.0 lb

## 2017-09-27 DIAGNOSIS — Z3041 Encounter for surveillance of contraceptive pills: Secondary | ICD-10-CM | POA: Diagnosis not present

## 2017-09-27 DIAGNOSIS — Z3049 Encounter for surveillance of other contraceptives: Secondary | ICD-10-CM | POA: Diagnosis not present

## 2017-09-27 DIAGNOSIS — Z3046 Encounter for surveillance of implantable subdermal contraceptive: Secondary | ICD-10-CM

## 2017-09-27 MED ORDER — NORETHIN-ETH ESTRAD-FE BIPHAS 1 MG-10 MCG / 10 MCG PO TABS: 1.0000 | ORAL_TABLET | Freq: Every day | ORAL | 3 refills | Status: DC

## 2017-09-27 NOTE — Patient Instructions (Signed)
Ethinyl Estradiol; Norethindrone Acetate; Ferrous fumarate tablets or capsules What is this medicine? ETHINYL ESTRADIOL; NORETHINDRONE ACETATE; FERROUS FUMARATE (ETH in il es tra DYE ole; nor eth IN drone AS e tate; FER us FUE ma rate) is an oral contraceptive. The products combine two types of female hormones, an estrogen and a progestin. They are used to prevent ovulation and pregnancy. Some products are also used to treat acne in females. This medicine may be used for other purposes; ask your health care provider or pharmacist if you have questions. COMMON BRAND NAME(S): Blisovi 24 Fe, Blisovi Fe, Estrostep Fe, Gildess 24 Fe, Gildess Fe 1.5/30, Gildess Fe 1/20, Junel Fe 1.5/30, Junel Fe 1/20, Junel Fe 24, Larin Fe, Lo Loestrin Fe, Loestrin 24 Fe, Loestrin FE 1.5/30, Loestrin FE 1/20, Lomedia 24 Fe, Microgestin 24 Fe, Microgestin Fe 1.5/30, Microgestin Fe 1/20, Tarina Fe 1/20, Taytulla, Tilia Fe, Tri-Legest Fe What should I tell my health care provider before I take this medicine? They need to know if you have any of these conditions: -abnormal vaginal bleeding -blood vessel disease -breast, cervical, endometrial, ovarian, liver, or uterine cancer -diabetes -gallbladder disease -heart disease or recent heart attack -high blood pressure -high cholesterol -history of blood clots -kidney disease -liver disease -migraine headaches -smoke tobacco -stroke -systemic lupus erythematosus (SLE) -an unusual or allergic reaction to estrogens, progestins, other medicines, foods, dyes, or preservatives -pregnant or trying to get pregnant -breast-feeding How should I use this medicine? Take this medicine by mouth. To reduce nausea, this medicine may be taken with food. Follow the directions on the prescription label. Take this medicine at the same time each day and in the order directed on the package. Do not take your medicine more often than directed. A patient package insert for the product will be  given with each prescription and refill. Read this sheet carefully each time. The sheet may change frequently. Contact your pediatrician regarding the use of this medicine in children. Special care may be needed. This medicine has been used in female children who have started having menstrual periods. Overdosage: If you think you have taken too much of this medicine contact a poison control center or emergency room at once. NOTE: This medicine is only for you. Do not share this medicine with others. What if I miss a dose? If you miss a dose, refer to the patient information sheet you received with your medicine for direction. If you miss more than one pill, this medicine may not be as effective and you may need to use another form of birth control. What may interact with this medicine? Do not take this medicine with the following medication: -dasabuvir; ombitasvir; paritaprevir; ritonavir -ombitasvir; paritaprevir; ritonavir This medicine may also interact with the following medications: -acetaminophen -antibiotics or medicines for infections, especially rifampin, rifabutin, rifapentine, and griseofulvin, and possibly penicillins or tetracyclines -aprepitant -ascorbic acid (vitamin C) -atorvastatin -barbiturate medicines, such as phenobarbital -bosentan -carbamazepine -caffeine -clofibrate -cyclosporine -dantrolene -doxercalciferol -felbamate -grapefruit juice -hydrocortisone -medicines for anxiety or sleeping problems, such as diazepam or temazepam -medicines for diabetes, including pioglitazone -mineral oil -modafinil -mycophenolate -nefazodone -oxcarbazepine -phenytoin -prednisolone -ritonavir or other medicines for HIV infection or AIDS -rosuvastatin -selegiline -soy isoflavones supplements -St. John's wort -tamoxifen or raloxifene -theophylline -thyroid hormones -topiramate -warfarin This list may not describe all possible interactions. Give your health care  provider a list of all the medicines, herbs, non-prescription drugs, or dietary supplements you use. Also tell them if you smoke, drink alcohol, or use illegal drugs. Some   items may interact with your medicine. What should I watch for while using this medicine? Visit your doctor or health care professional for regular checks on your progress. You will need a regular breast and pelvic exam and Pap smear while on this medicine. Use an additional method of contraception during the first cycle that you take these tablets. If you have any reason to think you are pregnant, stop taking this medicine right away and contact your doctor or health care professional. If you are taking this medicine for hormone related problems, it may take several cycles of use to see improvement in your condition. Smoking increases the risk of getting a blood clot or having a stroke while you are taking birth control pills, especially if you are more than 27 years old. You are strongly advised not to smoke. This medicine can make your body retain fluid, making your fingers, hands, or ankles swell. Your blood pressure can go up. Contact your doctor or health care professional if you feel you are retaining fluid. This medicine can make you more sensitive to the sun. Keep out of the sun. If you cannot avoid being in the sun, wear protective clothing and use sunscreen. Do not use sun lamps or tanning beds/booths. If you wear contact lenses and notice visual changes, or if the lenses begin to feel uncomfortable, consult your eye care specialist. In some women, tenderness, swelling, or minor bleeding of the gums may occur. Notify your dentist if this happens. Brushing and flossing your teeth regularly may help limit this. See your dentist regularly and inform your dentist of the medicines you are taking. If you are going to have elective surgery, you may need to stop taking this medicine before the surgery. Consult your health care  professional for advice. This medicine does not protect you against HIV infection (AIDS) or any other sexually transmitted diseases. What side effects may I notice from receiving this medicine? Side effects that you should report to your doctor or health care professional as soon as possible: -allergic reactions like skin rash, itching or hives, swelling of the face, lips, or tongue -breast tissue changes or discharge -changes in vaginal bleeding during your period or between your periods -changes in vision -chest pain -confusion -coughing up blood -dizziness -feeling faint or lightheaded -headaches or migraines -leg, arm or groin pain -loss of balance or coordination -severe or sudden headaches -stomach pain (severe) -sudden shortness of breath -sudden numbness or weakness of the face, arm or leg -symptoms of vaginal infection like itching, irritation or unusual discharge -tenderness in the upper abdomen -trouble speaking or understanding -vomiting -yellowing of the eyes or skin Side effects that usually do not require medical attention (report to your doctor or health care professional if they continue or are bothersome): -breakthrough bleeding and spotting that continues beyond the 3 initial cycles of pills -breast tenderness -mood changes, anxiety, depression, frustration, anger, or emotional outbursts -increased sensitivity to sun or ultraviolet light -nausea -skin rash, acne, or brown spots on the skin -weight gain (slight) This list may not describe all possible side effects. Call your doctor for medical advice about side effects. You may report side effects to FDA at 1-800-FDA-1088. Where should I keep my medicine? Keep out of the reach of children. Store at room temperature between 15 and 30 degrees C (59 and 86 degrees F). Throw away any unused medicine after the expiration date. NOTE: This sheet is a summary. It may not cover all possible information. If you   have  questions about this medicine, talk to your doctor, pharmacist, or health care provider.  2018 Elsevier/Gold Standard (2015-09-09 08:04:41)  

## 2017-09-27 NOTE — Progress Notes (Signed)
Contraception Counseling Patient presents for contraception counseling. The patient has no complaints today. The patient is sexually active. Pertinent past medical history: none.  Had Nexplanon placed after delivery 2 1/2 years ago.  Over the last year she feels she has gained weight, has more mood lability, and occas BTB.   PMHx: She  has a past medical history of Gallstones, Mental disorder, Sickle cell trait (HCC), and Urinary tract infection affecting care of mother, antepartum. Also,  has a past surgical history that includes No past surgeries., family history is not on file.,  reports that she has never smoked. She has never used smokeless tobacco. She reports that she does not drink alcohol or use drugs.  She has a current medication list which includes the following prescription(s): cyclobenzaprine, esomeprazole, esomeprazole, esomeprazole, ferrous sulfate, ibuprofen, norethindrone-ethinyl estradiol-fe biphas, prenatal multivitamin, tramadol, and valacyclovir. Also, has No Known Allergies.  Review of Systems  Constitutional: Negative for chills, fever and malaise/fatigue.  HENT: Negative for congestion, sinus pain and sore throat.   Eyes: Negative for blurred vision and pain.  Respiratory: Negative for cough and wheezing.   Cardiovascular: Negative for chest pain and leg swelling.  Gastrointestinal: Negative for abdominal pain, constipation, diarrhea, heartburn, nausea and vomiting.  Genitourinary: Negative for dysuria, frequency, hematuria and urgency.  Musculoskeletal: Negative for back pain, joint pain, myalgias and neck pain.  Skin: Negative for itching and rash.  Neurological: Negative for dizziness, tremors and weakness.  Endo/Heme/Allergies: Does not bruise/bleed easily.  Psychiatric/Behavioral: Negative for depression. The patient is not nervous/anxious and does not have insomnia.     Objective: BP 100/60   Ht 5\' 1"  (1.549 m)   Wt 163 lb (73.9 kg)   LMP 09/14/2017   BMI  30.80 kg/m  Physical Exam  Constitutional: She is oriented to person, place, and time. She appears well-developed and well-nourished. No distress.  Musculoskeletal: Normal range of motion.  Neurological: She is alert and oriented to person, place, and time.  Skin: Skin is warm and dry.  Psychiatric: She has a normal mood and affect.  Vitals reviewed.  ASSESSMENT/PLAN:     Encounter for surveillance of contraceptive pills    -  Primary   Encounter for removal of subdermal contraceptive implant      Desires Nexplanon out and to start OCP OCPs The risks /benefits of OCPs have been explained to the patient in detail.  Product literature has been given to her.  I have instructed her in the use of OCPs and have given her literature reinforcing this information.  I have explained to the patient that OCPs are not as effective for birth control during the first month of use, and that another form of contraception should be used during this time.  Both first-day start and Sunday start have been explained.  The risks and benefits of each was discussed.  She has been made aware of  the fact that other medications may affect the efficacy of OCPs.  I have answered all of her questions, and I believe that she has an understanding of the effectiveness and use of OCPs. A total of 15 minutes were spent face-to-face with the patient during this encounter and over half of that time dealt with counseling and coordination of care.   Nexplanon removal Procedure note - The Nexplanon was noted in the patient's arm and the end was identified. The skin was cleansed with a Betadine solution. A small injection of subcutaneous lidocaine with epinephrine was given over the end of  the implant. An incision was made at the end of the implant. The rod was noted in the incision and grasped with a hemostat. It was noted to be intact.  Steri-Strip was placed approximating the incision. Hemostasis was noted.  Annamarie MajorPaul Raylen Tangonan, MD,  Merlinda FrederickFACOG [facility], [facility] 09/27/2017  4:59 PM

## 2018-01-12 NOTE — L&D Delivery Note (Signed)
Delivery Note   Katelyn Mendoza, Katelyn Mendoza [[id number]]  At 4:53 AM a viable female was delivered via Vaginal, Spontaneous (Presentation: ;  ).  APGAR: 8, 9; weight 5 lb 6.1 oz (2440 g).   Placenta status: spontanous, intact.  Cord: 3VC  with ut complications: .  Anesthesia:  Epidural Episiotomy: None Lacerations:  none Suture Repair: N/A  Est. Blood Loss (mL):  EBL 685mL, QBL pending    Katelyn Mendoza, Katelyn Mendoza [[id number]]  At 5:08 AM a viable female was delivered via Vaginal, Forceps (Presentation:straight OP).  Following delivery of baby A, fetal, B was noted to be in cephalic presentation, AROM produced clear fluid.  Fetal heart tones were noted to be in the 90's low 100's.  An FSE was applied to confirm heart tones.  Patient was pushing to +2 station but not making much descent.  In the setting of category III tracing discussion was had with mother that delivery needed to be expedited.  Verbal consent obtained.  Long Elliot forceps were applied, placement was checked and the shank of the forceps was confirmd to be in line with the midline suture.  On the next contraction the fetal head was guided under the pubic symphysis and the head delivered.  Forceps were disarticulated. APGAR:6, 8; weight 4 lb 13.3 oz (2190 g).   Placenta status:spontanous, intact.  Cord: 3VC with marginal cord insertion. The cord avulsed at the time of placenta delivery.  Anesthesia:   Episiotomy: None Lacerations: None Suture Repair: N.A There was some uterine atony, given increased hemorrhage risk with twins as well as starting Hgb of 9.3. Est. Blood Loss (mL):  EBL 647mL, QBL pending    Mom to postpartum.   Baby A to Couplet care / Skin to Skin.   Baby B to Couplet care / Skin to Skin.  Malachy Mood 12/14/2018, 5:27 AM

## 2018-02-14 ENCOUNTER — Emergency Department
Admission: EM | Admit: 2018-02-14 | Discharge: 2018-02-14 | Disposition: A | Discharge status: Home / Self Care | Payer: Medicaid Other | Attending: Student in an Organized Health Care Education/Training Program | Admitting: Student in an Organized Health Care Education/Training Program

## 2018-02-14 ENCOUNTER — Encounter: Payer: Self-pay | Admitting: Emergency Medicine

## 2018-02-14 ENCOUNTER — Other Ambulatory Visit: Payer: Self-pay

## 2018-02-14 DIAGNOSIS — J111 Influenza due to unidentified influenza virus with other respiratory manifestations: Secondary | ICD-10-CM | POA: Insufficient documentation

## 2018-02-14 DIAGNOSIS — R69 Illness, unspecified: Secondary | ICD-10-CM

## 2018-02-14 DIAGNOSIS — Z79899 Other long term (current) drug therapy: Secondary | ICD-10-CM | POA: Insufficient documentation

## 2018-02-14 DIAGNOSIS — N760 Acute vaginitis: Secondary | ICD-10-CM | POA: Insufficient documentation

## 2018-02-14 DIAGNOSIS — B9689 Other specified bacterial agents as the cause of diseases classified elsewhere: Secondary | ICD-10-CM

## 2018-02-14 LAB — URINALYSIS, COMPLETE (UACMP) WITH MICROSCOPIC
Bilirubin Urine: NEGATIVE
GLUCOSE, UA: NEGATIVE mg/dL
HGB URINE DIPSTICK: NEGATIVE
KETONES UR: NEGATIVE mg/dL
Leukocytes, UA: NEGATIVE
NITRITE: NEGATIVE
PH: 6 (ref 5.0–8.0)
PROTEIN: NEGATIVE mg/dL
Specific Gravity, Urine: 1.019 (ref 1.005–1.030)

## 2018-02-14 LAB — WET PREP, GENITAL
Sperm: NONE SEEN
Trich, Wet Prep: NONE SEEN
Yeast Wet Prep HPF POC: NONE SEEN

## 2018-02-14 LAB — GROUP A STREP BY PCR: Group A Strep by PCR: NOT DETECTED

## 2018-02-14 LAB — POCT PREGNANCY, URINE: Preg Test, Ur: NEGATIVE

## 2018-02-14 LAB — CHLAMYDIA/NGC RT PCR (ARMC ONLY)
CHLAMYDIA TR: NOT DETECTED
N GONORRHOEAE: NOT DETECTED

## 2018-02-14 LAB — INFLUENZA PANEL BY PCR (TYPE A & B)
INFLAPCR: NEGATIVE
Influenza B By PCR: NEGATIVE

## 2018-02-14 MED ORDER — METRONIDAZOLE 500 MG PO TABS: 500.0000 mg | ORAL_TABLET | Freq: Two times a day (BID) | ORAL | 0 refills | Status: AC

## 2018-02-14 MED ORDER — GUAIFENESIN-DM 100-10 MG/5ML PO SYRP: 5.0000 mL | ORAL_SOLUTION | ORAL | 0 refills | Status: DC | PRN

## 2018-02-14 NOTE — ED Provider Notes (Signed)
[facility] Emergency Department Provider Note  ____________________________________________  Time seen: Approximately 12:19 PM  I have reviewed the triage vital signs and the nursing notes.   HISTORY  Chief Complaint Sore Throat and Vaginal Pain    HPI Katelyn Mendoza is a 28 y.o. female who presents to emergency department for evaluation of chills, nasal congestion, sore throat, nonproductive cough for 2 days and vaginal pain with discharge for 1 week.  Patient states that vaginal discharge is gray in color.  She is not concerned of an STD.  Her menstrual cycle was early last month.  No vomiting, diarrhea.  Past Medical History:  Diagnosis Date  . Gallstones   . Mental disorder    bipolar disorder dx'd at age 28  . Sickle cell trait (HCC)   . Urinary tract infection affecting care of mother, antepartum     Patient Active Problem List   Diagnosis Date Noted  . Premature rupture of membranes 02/18/2015  . Headache in pregnancy, antepartum 01/30/2015  . Labor and delivery indication for care or intervention 11/24/2014  . Vulvovaginal pruritus 11/24/2014    Past Surgical History:  Procedure Laterality Date  . NO PAST SURGERIES      Prior to Admission medications   Medication Sig Start Date End Date Taking? Authorizing Provider  cyclobenzaprine (FLEXERIL) 10 MG tablet Take 1 tablet (10 mg total) by mouth 3 (three) times daily as needed. Patient not taking: Reported on 09/27/2017 09/03/17   Joni ReiningSmith, Ronald K, PA-C  esomeprazole (NEXIUM) 40 MG capsule Take 1 capsule (40 mg total) by mouth daily. Patient not taking: Reported on 09/27/2017 06/18/17 06/18/18  Arnaldo NatalMalinda, Paul F, MD  esomeprazole (NEXIUM) 40 MG capsule Take 1 capsule (40 mg total) by mouth daily. Patient not taking: Reported on 09/27/2017 06/18/17 06/18/18  Arnaldo NatalMalinda, Paul F, MD  esomeprazole (NEXIUM) 40 MG capsule Take 1 capsule (40 mg total) by mouth daily. Patient not taking: Reported on 09/27/2017 06/18/17  06/18/18  Arnaldo NatalMalinda, Paul F, MD  ferrous sulfate 325 (65 FE) MG tablet Take 1 tablet (325 mg total) by mouth daily with breakfast. Patient not taking: Reported on 09/27/2017 02/20/15   Marta AntuBrothers, Tamara, CNM  guaiFENesin-dextromethorphan (ROBITUSSIN DM) 100-10 MG/5ML syrup Take 5 mLs by mouth every 4 (four) hours as needed for cough. 02/14/18   Enid DerryWagner, Ettamae Barkett, PA-C  ibuprofen (ADVIL,MOTRIN) 600 MG tablet Take 1 tablet (600 mg total) by mouth every 8 (eight) hours as needed. Patient not taking: Reported on 09/27/2017 09/03/17   Joni ReiningSmith, Ronald K, PA-C  metroNIDAZOLE (FLAGYL) 500 MG tablet Take 1 tablet (500 mg total) by mouth 2 (two) times daily for 7 days. 02/14/18 02/21/18  Enid DerryWagner, Meyli Boice, PA-C  Norethindrone-Ethinyl Estradiol-Fe Biphas (LO LOESTRIN FE) 1 MG-10 MCG / 10 MCG tablet Take 1 tablet by mouth daily. 09/27/17 12/20/17  Nadara MustardHarris, Robert P, MD  Prenatal Vit-Fe Fumarate-FA (PRENATAL MULTIVITAMIN) TABS tablet Take 1 tablet by mouth daily at 12 noon.    [provider]  traMADol (ULTRAM) 50 MG tablet Take 1 tablet (50 mg total) by mouth every 12 (twelve) hours as needed. Patient not taking: Reported on 09/27/2017 09/03/17   Joni ReiningSmith, Ronald K, PA-C  valACYclovir (VALTREX) 500 MG tablet Take 1 tablet (500 mg total) by mouth daily. Patient not taking: Reported on 09/27/2017 02/20/15   Marta AntuBrothers, Tamara, CNM    Allergies Patient has no known allergies.  No family history on file.  Social History Social History   Tobacco Use  . Smoking status: Never  Smoker  . Smokeless tobacco: Never Used  Substance Use Topics  . Alcohol use: No  . Drug use: No     Review of Systems  Constitutional: Positive for chills. ENT: Positive for nasal congestion and sore throat. Cardiovascular: No chest pain. Respiratory: Positive for cough. No SOB. Gastrointestinal: No abdominal pain.  No nausea, no vomiting.  Genitourinary: Negative for dysuria. Musculoskeletal: Negative for musculoskeletal pain. Skin: Negative for  rash, abrasions, lacerations, ecchymosis. Neurological: Negative for headaches, numbness or tingling   ____________________________________________   PHYSICAL EXAM:  VITAL SIGNS: ED Triage Vitals [02/14/18 1044]  Enc Vitals Group     BP 106/72     Pulse Rate 100     Resp 16     Temp 98.8 F (37.1 C)     Temp Source Oral     SpO2 98 %     Weight 177 lb (80.3 kg)     Height 5\' 1"  (1.549 m)     Head Circumference      Peak Flow      Pain Score 8     Pain Loc      Pain Edu?      Excl. in GC?      Constitutional: Alert and oriented. Well appearing and in no acute distress. Eyes: Conjunctivae are normal. PERRL. EOMI. Head: Atraumatic. ENT:      Ears: Tympanic membranes are pearly.x n      Nose: Mild congestion/rhinnorhea.        Mouth/Throat: Mucous membranes are moist.  Oropharynx erythematous.  Tonsils not enlarged. Neck: No stridor. Cardiovascular: Normal rate, regular rhythm.  Good peripheral circulation. Respiratory: Normal respiratory effort without tachypnea or retractions. Lungs CTAB. Good air entry to the bases with no decreased or absent breath sounds. Gastrointestinal: Bowel sounds 4 quadrants. Soft and nontender to palpation. No guarding or rigidity. No palpable masses. No distention. Genitourinary: No external rashes or lesions seen.  Thin white vaginal discharge.  No cervical motion tenderness. Musculoskeletal: Full range of motion to all extremities. No gross deformities appreciated. Neurologic:  Normal speech and language. No gross focal neurologic deficits are appreciated.  Skin:  Skin is warm, dry and intact. No rash noted. Psychiatric: Mood and affect are normal. Speech and behavior are normal. Patient exhibits appropriate insight and judgement.   ____________________________________________   LABS (all labs ordered are listed, but only abnormal results are displayed)  Labs Reviewed  WET PREP, GENITAL - Abnormal; Notable for the following  components:      Result Value   Clue Cells Wet Prep HPF POC PRESENT (*)    WBC, Wet Prep HPF POC RARE (*)    All other components within normal limits  URINALYSIS, COMPLETE (UACMP) WITH MICROSCOPIC - Abnormal; Notable for the following components:   Color, Urine YELLOW (*)    APPearance CLOUDY (*)    Bacteria, UA RARE (*)    All other components within normal limits  CHLAMYDIA/NGC RT PCR ([facility] ONLY)  GROUP A STREP BY PCR  URINE CULTURE  INFLUENZA PANEL BY PCR (TYPE A & B)  POC URINE PREG, ED  POCT PREGNANCY, URINE   ____________________________________________  EKG   ____________________________________________  RADIOLOGY   No results found.  ____________________________________________    PROCEDURES  Procedure(s) performed:    Procedures    Medications - No data to display   ____________________________________________   INITIAL IMPRESSION / ASSESSMENT AND PLAN / ED COURSE  Pertinent labs & imaging results that were available during my care of  the patient were reviewed by me and considered in my medical decision making (see chart for details).  Review of the [place] CSRS was performed in accordance of the [facility] prior to dispensing any controlled drugs.   Patient's diagnosis is consistent with influenza-like illness and bacterial vaginosis.  Vital signs and exam are reassuring.  Influenza and strep are negative.  There is some rare bacteria on urinalysis, which will be sent for culture.  This is likely contamination.  Wet prep consistent with bacterial vaginosis.  Gonorrhea and Chlamydia are negative.  Patient will be discharged home with prescriptions for Flagyl and Robitussin.. Patient is to follow up with primary care as directed. Patient is given ED precautions to return to the ED for any worsening or new symptoms.     ____________________________________________  FINAL CLINICAL IMPRESSION(S) / ED DIAGNOSES  Final diagnoses:  Influenza-like  illness  Bacterial vaginosis      NEW MEDICATIONS STARTED DURING THIS VISIT:  ED Discharge Orders         Ordered    metroNIDAZOLE (FLAGYL) 500 MG tablet  2 times daily     02/14/18 1430    guaiFENesin-dextromethorphan (ROBITUSSIN DM) 100-10 MG/5ML syrup  Every 4 hours PRN     02/14/18 1430              This chart was dictated using voice recognition software/Dragon. Despite best efforts to proofread, errors can occur which can change the meaning. Any change was purely unintentional.    Enid Derry, PA-C 02/14/18 1551    Willy Eddy, MD 02/15/18 [phone]

## 2018-02-14 NOTE — ED Triage Notes (Signed)
C/O sore throat and vaginal pain.  Sore throat x 2 days, vaginal pain x 1 week.

## 2018-02-16 LAB — URINE CULTURE

## 2018-03-08 ENCOUNTER — Telehealth: Payer: Self-pay | Admitting: Emergency Medicine

## 2018-03-08 NOTE — Telephone Encounter (Signed)
Patient called for std results.  Gave her results and asked her to follow up with pcp.

## 2018-04-19 LAB — HM HIV SCREENING LAB: HM HIV Screening: NEGATIVE

## 2018-05-05 ENCOUNTER — Encounter: Payer: Self-pay | Admitting: Emergency Medicine

## 2018-05-05 ENCOUNTER — Telehealth: Payer: Self-pay

## 2018-05-05 ENCOUNTER — Emergency Department
Admission: EM | Admit: 2018-05-05 | Discharge: 2018-05-05 | Disposition: A | Discharge status: Home / Self Care | Payer: Medicaid Other | Attending: Student in an Organized Health Care Education/Training Program | Admitting: Student in an Organized Health Care Education/Training Program

## 2018-05-05 ENCOUNTER — Other Ambulatory Visit: Payer: Self-pay

## 2018-05-05 DIAGNOSIS — O23591 Infection of other part of genital tract in pregnancy, first trimester: Secondary | ICD-10-CM | POA: Diagnosis not present

## 2018-05-05 DIAGNOSIS — N760 Acute vaginitis: Secondary | ICD-10-CM

## 2018-05-05 DIAGNOSIS — Z79899 Other long term (current) drug therapy: Secondary | ICD-10-CM | POA: Insufficient documentation

## 2018-05-05 DIAGNOSIS — Z3A01 Less than 8 weeks gestation of pregnancy: Secondary | ICD-10-CM | POA: Diagnosis not present

## 2018-05-05 DIAGNOSIS — L731 Pseudofolliculitis barbae: Secondary | ICD-10-CM

## 2018-05-05 DIAGNOSIS — B9689 Other specified bacterial agents as the cause of diseases classified elsewhere: Secondary | ICD-10-CM

## 2018-05-05 DIAGNOSIS — B373 Candidiasis of vulva and vagina: Secondary | ICD-10-CM

## 2018-05-05 DIAGNOSIS — B3731 Acute candidiasis of vulva and vagina: Secondary | ICD-10-CM

## 2018-05-05 LAB — URINALYSIS, COMPLETE (UACMP) WITH MICROSCOPIC
Bacteria, UA: NONE SEEN
Bilirubin Urine: NEGATIVE
Glucose, UA: NEGATIVE mg/dL
Hgb urine dipstick: NEGATIVE
Ketones, ur: NEGATIVE mg/dL
Nitrite: NEGATIVE
Protein, ur: NEGATIVE mg/dL
Specific Gravity, Urine: 1.018 (ref 1.005–1.030)
pH: 6 (ref 5.0–8.0)

## 2018-05-05 LAB — WET PREP, GENITAL
Sperm: NONE SEEN
Trich, Wet Prep: NONE SEEN

## 2018-05-05 MED ORDER — FLUCONAZOLE 100 MG PO TABS: 150.0000 mg | ORAL_TABLET | Freq: Once | ORAL | Status: DC

## 2018-05-05 MED ORDER — MICONAZOLE NITRATE 2 % VA CREA: 1.0000 | TOPICAL_CREAM | Freq: Every day | VAGINAL | 0 refills | Status: DC

## 2018-05-05 MED ORDER — METOCLOPRAMIDE HCL 5 MG PO TABS: 5.0000 mg | ORAL_TABLET | Freq: Three times a day (TID) | ORAL | 0 refills | Status: DC | PRN

## 2018-05-05 MED ORDER — METRONIDAZOLE 500 MG PO TABS: 500.0000 mg | ORAL_TABLET | Freq: Two times a day (BID) | ORAL | 0 refills | Status: AC

## 2018-05-05 MED ORDER — METOCLOPRAMIDE HCL 10 MG PO TABS
10.0000 mg | ORAL_TABLET | Freq: Once | ORAL | Status: AC
  Administered 2018-05-05: 10 mg via ORAL

## 2018-05-05 MED ORDER — METRONIDAZOLE 500 MG PO TABS
500.0000 mg | ORAL_TABLET | Freq: Once | ORAL | Status: AC
  Administered 2018-05-05: 500 mg via ORAL
  Filled 2018-05-05: qty 1

## 2018-05-05 NOTE — ED Notes (Signed)
See triage note  Presents with some urinary freq with some vaginal itching   States sx's started about 1 week ago.  Also having some generalized abd   Discomfort.  States she has a hx of gallbladder problems and feels like this may be d/t G/B   Positive nausea   States sx's are worse in the mornings

## 2018-05-05 NOTE — Telephone Encounter (Signed)
Pt calling triage thinking she has a yeast, I advised her to try otc yeast medication. Then she stated she has a bump on her vagina that's been there for two weeks, and she wanted to be checked for STD, Huntley Dec called to schedule a appointment

## 2018-05-05 NOTE — ED Triage Notes (Signed)
Pt reports neck pain and vaginal itching for about a week.

## 2018-05-05 NOTE — ED Notes (Signed)
Pt alert and oriented X4, active, cooperative, pt in NAD. RR even and unlabored, color WNL.  Pt informed to return if any life threatening symptoms occur.  Discharge and followup instructions reviewed. Ambulates safely. 

## 2018-05-05 NOTE — Discharge Instructions (Addendum)
Take the antibiotic and use the vaginal insert cream as directed. Follow-up with your OB provider for ongoing symptoms.

## 2018-05-05 NOTE — ED Provider Notes (Signed)
[facility] Emergency Department Provider Note ____________________________________________  Time seen: 1527  I have reviewed the triage vital signs and the nursing notes.  HISTORY  Chief Complaint  Vaginal Itching and Neck Pain  HPI Katelyn Mendoza is a 28 y.o. female presents to the ED for evaluation of of left-sided neck pain as well as some vaginal irritation for about a week.  Patient who is about [redacted] weeks pregnant, reports that she has had some vaginal irritation and itching for the last week.  She denies any abnormal vaginal bleeding, pelvic pain, back pain, nausea, vomiting, or dysuria.  She has unrelated complaint of some neck pain to the left side without preceding injury, accident, or trauma.  She has reported previous episodes of neck muscle pain when she sleeps wrong.  She denies any distal paresthesia, chest pain, shortness of breath.  Past Medical History:  Diagnosis Date  . Gallstones   . Mental disorder    bipolar disorder dx'd at age 19  . Sickle cell trait (HCC)   . Urinary tract infection affecting care of mother, antepartum     Patient Active Problem List   Diagnosis Date Noted  . Premature rupture of membranes 02/18/2015  . Headache in pregnancy, antepartum 01/30/2015  . Labor and delivery indication for care or intervention 11/24/2014  . Vulvovaginal pruritus 11/24/2014    Past Surgical History:  Procedure Laterality Date  . NO PAST SURGERIES      Prior to Admission medications   Medication Sig Start Date End Date Taking? Authorizing Provider  guaiFENesin-dextromethorphan (ROBITUSSIN DM) 100-10 MG/5ML syrup Take 5 mLs by mouth every 4 (four) hours as needed for cough. 02/14/18   Enid Derry, PA-C  metoCLOPramide (REGLAN) 5 MG tablet Take 1 tablet (5 mg total) by mouth every 8 (eight) hours as needed for nausea or vomiting. 05/05/18   Aisa Schoeppner, Charlesetta Ivory, PA-C  metroNIDAZOLE (FLAGYL) 500 MG tablet Take 1 tablet (500 mg total) by  mouth 2 (two) times daily for 7 days. 05/05/18 05/12/18  Ulric Salzman, Charlesetta Ivory, PA-C  miconazole (MICONAZOLE 7) 2 % vaginal cream Place 1 Applicatorful vaginally at bedtime. 05/05/18   Jurline Folger, Charlesetta Ivory, PA-C  Norethindrone-Ethinyl Estradiol-Fe Biphas (LO LOESTRIN FE) 1 MG-10 MCG / 10 MCG tablet Take 1 tablet by mouth daily. 09/27/17 12/20/17  Nadara Mustard, MD  Prenatal Vit-Fe Fumarate-FA (PRENATAL MULTIVITAMIN) TABS tablet Take 1 tablet by mouth daily at 12 noon.    [provider]    Allergies Patient has no known allergies.  No family history on file.  Social History Social History   Tobacco Use  . Smoking status: Never Smoker  . Smokeless tobacco: Never Used  Substance Use Topics  . Alcohol use: No  . Drug use: No    Review of Systems  Constitutional: Negative for fever. Cardiovascular: Negative for chest pain. Respiratory: Negative for shortness of breath. Gastrointestinal: Negative for abdominal pain, vomiting and diarrhea. Genitourinary: Negative for dysuria. Reports vaginal irritation and a bump to the labia.  Musculoskeletal: Negative for back pain. Reports mild left neck pain Skin: Negative for rash. Neurological: Negative for headaches, focal weakness or numbness. ____________________________________________  PHYSICAL EXAM:  VITAL SIGNS: ED Triage Vitals  Enc Vitals Group     BP 05/05/18 1421 112/63     Pulse Rate 05/05/18 1421 78     Resp 05/05/18 1624 16     Temp 05/05/18 1421 98.8 F (37.1 C)     Temp Source 05/05/18  1421 Oral     SpO2 05/05/18 1421 100 %     Weight 05/05/18 1418 170 lb (77.1 kg)     Height 05/05/18 1418 5\' 1"  (1.549 m)     Head Circumference --      Peak Flow --      Pain Score 05/05/18 1418 8     Pain Loc --      Pain Edu? --      Excl. in GC? --     Constitutional: Alert and oriented. Well appearing and in no distress. Head: Normocephalic and atraumatic. Eyes: Conjunctivae are normal. Normal extraocular  movements Cardiovascular: Normal rate, regular rhythm. Normal distal pulses. Respiratory: Normal respiratory effort.  Gastrointestinal: Soft, gravid, and nontender. No distention. Musculoskeletal: normal cervical ROM without crepitus. No distracting midline tenderness.  GU: normal external genitalia. Recently shaved bikini region with a single, nontender papule to the left mons pubis; consistent with an ingrown hair. White vaginal discharge noted at the introitus. Wet prep collected without a speculum.  Skin:  Skin is warm, dry and intact. No rash noted. ____________________________________________   LABS (pertinent positives/negatives)  Labs Reviewed  WET PREP, GENITAL - Abnormal; Notable for the following components:      Result Value   Yeast Wet Prep HPF POC PRESENT (*)    Clue Cells Wet Prep HPF POC PRESENT (*)    WBC, Wet Prep HPF POC FEW (*)    All other components within normal limits  URINALYSIS, COMPLETE (UACMP) WITH MICROSCOPIC - Abnormal; Notable for the following components:   Color, Urine YELLOW (*)    APPearance CLEAR (*)    Leukocytes,Ua TRACE (*)    All other components within normal limits  ____________________________________________  PROCEDURES  Procedures Reglan 10 mg PO Metronidazole 500 mg PO ____________________________________________  INITIAL IMPRESSION / ASSESSMENT AND PLAN / ED COURSE  Patient with ED evaluation of vaginal itching and neck pain.  Patient is about gestational age with no pregnancy related complaints.  Neck pain is likely due to musculoskeletal neck strain without any signs of distal paresthesias or neuromuscular deficit.  She will take Tylenol as needed for neck pain relief.  Patient was found to have clue cells and yeast buds on her wet prep.  She will be treated with mom notes that pain and metronidazole tablets.  She will follow-up with her OB provider for ongoing symptoms or return to the ED as  needed. ____________________________________________  FINAL CLINICAL IMPRESSION(S) / ED DIAGNOSES  Final diagnoses:  Ingrown hair  Yeast vaginitis  BV (bacterial vaginosis)      Karmen StabsMenshew, Charlesetta IvoryJenise V Bacon, PA-C 05/05/18 2352    Willy Eddyobinson, Patrick, MD 05/09/18 [phone]

## 2018-05-06 ENCOUNTER — Ambulatory Visit: Payer: Medicaid Other | Admitting: Certified Nurse Midwife

## 2018-05-10 ENCOUNTER — Ambulatory Visit (INDEPENDENT_AMBULATORY_CARE_PROVIDER_SITE_OTHER): Payer: Medicaid Other | Admitting: Obstetrics & Gynecology

## 2018-05-10 ENCOUNTER — Other Ambulatory Visit: Payer: Self-pay

## 2018-05-10 ENCOUNTER — Encounter: Payer: Self-pay | Admitting: Obstetrics & Gynecology

## 2018-05-10 VITALS — Wt 170.0 lb

## 2018-05-10 DIAGNOSIS — Z3490 Encounter for supervision of normal pregnancy, unspecified, unspecified trimester: Secondary | ICD-10-CM

## 2018-05-10 DIAGNOSIS — O219 Vomiting of pregnancy, unspecified: Secondary | ICD-10-CM | POA: Diagnosis not present

## 2018-05-10 DIAGNOSIS — B009 Herpesviral infection, unspecified: Secondary | ICD-10-CM | POA: Diagnosis not present

## 2018-05-10 DIAGNOSIS — Z3A01 Less than 8 weeks gestation of pregnancy: Secondary | ICD-10-CM

## 2018-05-10 DIAGNOSIS — O0990 Supervision of high risk pregnancy, unspecified, unspecified trimester: Secondary | ICD-10-CM | POA: Insufficient documentation

## 2018-05-10 MED ORDER — DOXYLAMINE-PYRIDOXINE 10-10 MG PO TBEC: 2.0000 | DELAYED_RELEASE_TABLET | Freq: Every day | ORAL | 5 refills | Status: DC

## 2018-05-10 MED ORDER — VALACYCLOVIR HCL 500 MG PO TABS: 500.0000 mg | ORAL_TABLET | Freq: Every day | ORAL | 11 refills | Status: DC

## 2018-05-10 NOTE — Patient Instructions (Signed)
First Trimester of Pregnancy  The first trimester of pregnancy is from week 1 until the end of week 13 (months 1 through 3). A week after a sperm fertilizes an egg, the egg will implant on the wall of the uterus. This embryo will begin to develop into a baby. Genes from you and your partner will form the baby. The female genes will determine whether the baby will be a boy or a girl. At 6-8 weeks, the eyes and face will be formed, and the heartbeat can be seen on ultrasound. At the end of 12 weeks, all the baby's organs will be formed.  Now that you are pregnant, you will want to do everything you can to have a healthy baby. Two of the most important things are to get good prenatal care and to follow your health care provider's instructions. Prenatal care is all the medical care you receive before the baby's birth. This care will help prevent, find, and treat any problems during the pregnancy and childbirth.  Body changes during your first trimester  Your body goes through many changes during pregnancy. The changes vary from woman to woman.   You may gain or lose a couple of pounds at first.   You may feel sick to your stomach (nauseous) and you may throw up (vomit). If the vomiting is uncontrollable, call your health care provider.   You may tire easily.   You may develop headaches that can be relieved by medicines. All medicines should be approved by your health care provider.   You may urinate more often. Painful urination may mean you have a bladder infection.   You may develop heartburn as a result of your pregnancy.   You may develop constipation because certain hormones are causing the muscles that push stool through your intestines to slow down.   You may develop hemorrhoids or swollen veins (varicose veins).   Your breasts may begin to grow larger and become tender. Your nipples may stick out more, and the tissue that surrounds them (areola) may become darker.   Your gums may bleed and may be  sensitive to brushing and flossing.   Dark spots or blotches (chloasma, mask of pregnancy) may develop on your face. This will likely fade after the baby is born.   Your menstrual periods will stop.   You may have a loss of appetite.   You may develop cravings for certain kinds of food.   You may have changes in your emotions from day to day, such as being excited to be pregnant or being concerned that something may go wrong with the pregnancy and baby.   You may have more vivid and strange dreams.   You may have changes in your hair. These can include thickening of your hair, rapid growth, and changes in texture. Some women also have hair loss during or after pregnancy, or hair that feels dry or thin. Your hair will most likely return to normal after your baby is born.  What to expect at prenatal visits  During a routine prenatal visit:   You will be weighed to make sure you and the baby are growing normally.   Your blood pressure will be taken.   Your abdomen will be measured to track your baby's growth.   The fetal heartbeat will be listened to between weeks 10 and 14 of your pregnancy.   Test results from any previous visits will be discussed.  Your health care provider may ask you:     How you are feeling.   If you are feeling the baby move.   If you have had any abnormal symptoms, such as leaking fluid, bleeding, severe headaches, or abdominal cramping.   If you are using any tobacco products, including cigarettes, chewing tobacco, and electronic cigarettes.   If you have any questions.  Other tests that may be performed during your first trimester include:   Blood tests to find your blood type and to check for the presence of any previous infections. The tests will also be used to check for low iron levels (anemia) and protein on red blood cells (Rh antibodies). Depending on your risk factors, or if you previously had diabetes during pregnancy, you may have tests to check for high blood sugar  that affects pregnant women (gestational diabetes).   Urine tests to check for infections, diabetes, or protein in the urine.   An ultrasound to confirm the proper growth and development of the baby.   Fetal screens for spinal cord problems (spina bifida) and Down syndrome.   HIV (human immunodeficiency virus) testing. Routine prenatal testing includes screening for HIV, unless you choose not to have this test.   You may need other tests to make sure you and the baby are doing well.  Follow these instructions at home:  Medicines   Follow your health care provider's instructions regarding medicine use. Specific medicines may be either safe or unsafe to take during pregnancy.   Take a prenatal vitamin that contains at least 600 micrograms (mcg) of folic acid.   If you develop constipation, try taking a stool softener if your health care provider approves.  Eating and drinking     Eat a balanced diet that includes fresh fruits and vegetables, whole grains, good sources of protein such as meat, eggs, or tofu, and low-fat dairy. Your health care provider will help you determine the amount of weight gain that is right for you.   Avoid raw meat and uncooked cheese. These carry germs that can cause birth defects in the baby.   Eating four or five small meals rather than three large meals a day may help relieve nausea and vomiting. If you start to feel nauseous, eating a few soda crackers can be helpful. Drinking liquids between meals, instead of during meals, also seems to help ease nausea and vomiting.   Limit foods that are high in fat and processed sugars, such as fried and sweet foods.   To prevent constipation:  ? Eat foods that are high in fiber, such as fresh fruits and vegetables, whole grains, and beans.  ? Drink enough fluid to keep your urine clear or pale yellow.  Activity   Exercise only as directed by your health care provider. Most women can continue their usual exercise routine during  pregnancy. Try to exercise for 30 minutes at least 5 days a week. Exercising will help you:  ? Control your weight.  ? Stay in shape.  ? Be prepared for labor and delivery.   Experiencing pain or cramping in the lower abdomen or lower back is a good sign that you should stop exercising. Check with your health care provider before continuing with normal exercises.   Try to avoid standing for long periods of time. Move your legs often if you must stand in one place for a long time.   Avoid heavy lifting.   Wear low-heeled shoes and practice good posture.   You may continue to have sex unless your health care   provider tells you not to.  Relieving pain and discomfort   Wear a good support bra to relieve breast tenderness.   Take warm sitz baths to soothe any pain or discomfort caused by hemorrhoids. Use hemorrhoid cream if your health care provider approves.   Rest with your legs elevated if you have leg cramps or low back pain.   If you develop varicose veins in your legs, wear support hose. Elevate your feet for 15 minutes, 3-4 times a day. Limit salt in your diet.  Prenatal care   Schedule your prenatal visits by the twelfth week of pregnancy. They are usually scheduled monthly at first, then more often in the last 2 months before delivery.   Write down your questions. Take them to your prenatal visits.   Keep all your prenatal visits as told by your health care provider. This is important.  Safety   Wear your seat belt at all times when driving.   Make a list of emergency phone numbers, including numbers for family, friends, the hospital, and police and fire departments.  General instructions   Ask your health care provider for a referral to a local prenatal education class. Begin classes no later than the beginning of month 6 of your pregnancy.   Ask for help if you have counseling or nutritional needs during pregnancy. Your health care provider can offer advice or refer you to specialists for help  with various needs.   Do not use hot tubs, steam rooms, or saunas.   Do not douche or use tampons or scented sanitary pads.   Do not cross your legs for long periods of time.   Avoid cat litter boxes and soil used by cats. These carry germs that can cause birth defects in the baby and possibly loss of the fetus by miscarriage or stillbirth.   Avoid all smoking, herbs, alcohol, and medicines not prescribed by your health care provider. Chemicals in these products affect the formation and growth of the baby.   Do not use any products that contain nicotine or tobacco, such as cigarettes and e-cigarettes. If you need help quitting, ask your health care provider. You may receive counseling support and other resources to help you quit.   Schedule a dentist appointment. At home, brush your teeth with a soft toothbrush and be gentle when you floss.  Contact a health care provider if:   You have dizziness.   You have mild pelvic cramps, pelvic pressure, or nagging pain in the abdominal area.   You have persistent nausea, vomiting, or diarrhea.   You have a bad smelling vaginal discharge.   You have pain when you urinate.   You notice increased swelling in your face, hands, legs, or ankles.   You are exposed to fifth disease or chickenpox.   You are exposed to German measles (rubella) and have never had it.  Get help right away if:   You have a fever.   You are leaking fluid from your vagina.   You have spotting or bleeding from your vagina.   You have severe abdominal cramping or pain.   You have rapid weight gain or loss.   You vomit blood or material that looks like coffee grounds.   You develop a severe headache.   You have shortness of breath.   You have any kind of trauma, such as from a fall or a car accident.  Summary   The first trimester of pregnancy is from week 1 until   the end of week 13 (months 1 through 3).   Your body goes through many changes during pregnancy. The changes vary from  woman to woman.   You will have routine prenatal visits. During those visits, your health care provider will examine you, discuss any test results you may have, and talk with you about how you are feeling.  This information is not intended to replace advice given to you by your health care provider. Make sure you discuss any questions you have with your health care provider.  Document Released: 12/23/2000 Document Revised: 12/11/2015 Document Reviewed: 12/11/2015  Elsevier Interactive Patient Education  2019 [employer]

## 2018-05-10 NOTE — Progress Notes (Signed)
Virtual Visit via Telephone Note  I connected with Katelyn Mendoza on 05/10/18 at  9:20 AM EDT by telephone and verified that I am speaking with the correct person using two identifiers.   I discussed the limitations, risks, security and privacy concerns of performing an evaluation and management service by telephone and the availability of in person appointments. I also discussed with the patient that there may be a patient responsible charge related to this service. The patient expressed understanding and agreed to proceed. She was at home and I was in my office.  History of Present Illness: Chief Complaint: Missed period  Transfer of Care Patient: no  History of Present Illness: [name] is a 28 y.o. G3P1011 824w2d based on Patient's last menstrual period was 03/27/2018. with an Estimated Date of Delivery: 01/01/19, with the above CC.   Her periods were: regular periods every 28 days She was using no method when she conceived. NEXPLANON removed Sept 2019 She has Positive signs or symptoms of nausea/vomiting of pregnancy. She has Negative signs or symptoms of miscarriage or preterm labor She identifies Negative Zika risk factors for her and her partner On any different medications around the time she conceived/early pregnancy: No  History of varicella: Yes   ROS: A 12-point review of systems was performed and negative, except as stated in the above HPI.  OBGYN History: As per HPI. OB History  Gravida Para Term Preterm AB Living  3 1 1  0 1 1  SAB TAB Ectopic Multiple Live Births  0 1 0 0 1    # Outcome Date GA Lbr Len/2nd Weight Sex Delivery Anes PTL Lv  3 Current           2 Term 02/18/15 5058w0d 06:00 / 02:13 6 lb 10.9 oz (3.03 kg) M Vag-Vacuum EPI  LIV     Birth Comments: Terminal MSAF  1 TAB 2008            Any issues with any prior pregnancies: no Any prior children are healthy, doing well, without any problems or issues: yes.  VAVD, meconium History of pap smears: yes. Last  pap smear normal History of STIs: No   Past Medical History: Past Medical History:  Diagnosis Date  . Gallstones   . Mental disorder    bipolar disorder dx'd at age 28  . Sickle cell trait (HCC)   . Urinary tract infection affecting care of mother, antepartum     Past Surgical History: Past Surgical History:  Procedure Laterality Date  . NO PAST SURGERIES      Family History:  Family History  Problem Relation Age of Onset  . Diabetes Mother   . Hypertension Mother    She denies any female cancers, bleeding or blood clotting disorders.  She denies any history of mental retardation, birth defects or genetic disorders in her or the FOB's history  Social History:  Social History   Socioeconomic History  . Marital status: Single    Spouse name: Not on file  . Number of children: Not on file  . Years of education: Not on file  . Highest education level: Not on file  Occupational History  . Not on file  Social Needs  . Financial resource strain: Not on file  . Food insecurity:    Worry: Not on file    Inability: Not on file  . Transportation needs:    Medical: Not on file    Non-medical: Not on file  Tobacco Use  .  Smoking status: Never Smoker  . Smokeless tobacco: Never Used  Substance and Sexual Activity  . Alcohol use: No  . Drug use: No  . Sexual activity: Yes  Lifestyle  . Physical activity:    Days per week: Not on file    Minutes per session: Not on file  . Stress: Not on file  Relationships  . Social connections:    Talks on phone: Not on file    Gets together: Not on file    Attends religious service: Not on file    Active member of club or organization: Not on file    Attends meetings of clubs or organizations: Not on file    Relationship status: Not on file  . Intimate partner violence:    Fear of current or ex partner: Not on file    Emotionally abused: Not on file    Physically abused: Not on file    Forced sexual activity: Not on file   Other Topics Concern  . Not on file  Social History Narrative  . Not on file   Any pets in the household: no  Allergy: No Known Allergies  Current Outpatient Medications:  Current Outpatient Medications:  .  metoCLOPramide (REGLAN) 5 MG tablet, Take 1 tablet (5 mg total) by mouth every 8 (eight) hours as needed for nausea or vomiting., Disp: 15 tablet, Rfl: 0 .  metroNIDAZOLE (FLAGYL) 500 MG tablet, Take 1 tablet (500 mg total) by mouth 2 (two) times daily for 7 days., Disp: 14 tablet, Rfl: 0 .  miconazole (MICONAZOLE 7) 2 % vaginal cream, Place 1 Applicatorful vaginally at bedtime., Disp: 45 g, Rfl: 0 .  Prenatal Vit-Fe Fumarate-FA (PRENATAL MULTIVITAMIN) TABS tablet, Take 1 tablet by mouth daily at 12 noon., Disp: , Rfl:  .  Doxylamine-Pyridoxine (DICLEGIS) 10-10 MG TBEC, Take 2 tablets by mouth at bedtime. If symptoms persist, add one tablet in the morning and one in the afternoon, Disp: 100 tablet, Rfl: 5 .  valACYclovir (VALTREX) 500 MG tablet, Take 1 tablet (500 mg total) by mouth daily for 30 days., Disp: 30 tablet, Rfl: 11   Assessment: [name] is a 28 y.o. G3P1011 [redacted]w[redacted]d based on Patient's last menstrual period was 03/27/2018. with an Estimated Date of Delivery: 01/01/19,  for prenatal care.  Plan:  1) Avoid alcoholic beverages. 2) Patient encouraged not to smoke.  3) Discontinue the use of all non-medicinal drugs and chemicals.  4) Take prenatal vitamins daily.  5) Seatbelt use advised 6) Nutrition, food safety (fish, cheese advisories, and high nitrite foods) and exercise discussed. 7) Hospital and practice style delivering at [facility] discussed  8) Patient is asked about travel to areas at risk for the Zika virus, and counseled to avoid travel and exposure to mosquitoes or sexual partners who may have themselves been exposed to the virus. Testing is discussed, and will be ordered as appropriate.  9) Childbirth classes at [facility] advised 10) Genetic Screening, such as with 1st  Trimester Screening, cell free fetal DNA, AFP testing, and Ultrasound, as well as with amniocentesis and CVS as appropriate, is discussed with patient. She plans to consider genetic testing this pregnancy.  Problem list reviewed and updated.   Observations/Objective: No exam today, due to telephone eVisit due to [facility] virus restriction on elective visits and procedures.  Prior visits reviewed along with ultrasounds/labs as indicated.  Assessment and Plan: 1. Nausea and vomiting during pregnancy - Doxylamine-Pyridoxine (DICLEGIS) 10-10 MG TBEC; Take 2 tablets by mouth at bedtime.  If symptoms persist, add one tablet in the morning and one in the afternoon  Dispense: 100 tablet; Refill: 5 - Change for Reglan as it is not helping  2. [redacted] weeks gestation of pregnancy - Arrange follow up for PAP, exam, labs, [place]  3. 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 The patient was provided an opportunity to ask questions and all were answered. The patient agreed with the plan and demonstrated an understanding of the instructions.   The patient was advised to call back or seek an in-person evaluation if the symptoms worsen or if the condition fails to improve as anticipated.  I provided 18 minutes of non-face-to-face time during this encounter.   Letitia Libra, MD

## 2018-05-18 ENCOUNTER — Other Ambulatory Visit: Payer: Self-pay

## 2018-05-18 ENCOUNTER — Emergency Department
Admission: EM | Admit: 2018-05-18 | Discharge: 2018-05-18 | Disposition: A | Discharge status: Home / Self Care | Payer: Medicaid Other | Attending: Emergency Medicine | Admitting: Emergency Medicine

## 2018-05-18 ENCOUNTER — Encounter: Payer: Self-pay | Admitting: Emergency Medicine

## 2018-05-18 DIAGNOSIS — Z5321 Procedure and treatment not carried out due to patient leaving prior to being seen by health care provider: Secondary | ICD-10-CM | POA: Insufficient documentation

## 2018-05-18 DIAGNOSIS — R1110 Vomiting, unspecified: Secondary | ICD-10-CM | POA: Insufficient documentation

## 2018-05-18 LAB — CBC
HCT: 42.4 % (ref 36.0–46.0)
Hemoglobin: 14.5 g/dL (ref 12.0–15.0)
MCH: 26.7 pg (ref 26.0–34.0)
MCHC: 34.2 g/dL (ref 30.0–36.0)
MCV: 77.9 fL — ABNORMAL LOW (ref 80.0–100.0)
Platelets: 392 10*3/uL (ref 150–400)
RBC: 5.44 MIL/uL — ABNORMAL HIGH (ref 3.87–5.11)
RDW: 13.2 % (ref 11.5–15.5)
WBC: 9 10*3/uL (ref 4.0–10.5)
nRBC: 0 % (ref 0.0–0.2)

## 2018-05-18 LAB — COMPREHENSIVE METABOLIC PANEL
ALT: 13 U/L (ref 0–44)
AST: 16 U/L (ref 15–41)
Albumin: 4.2 g/dL (ref 3.5–5.0)
Alkaline Phosphatase: 60 U/L (ref 38–126)
Anion gap: 9 (ref 5–15)
BUN: 10 mg/dL (ref 6–20)
CO2: 24 mmol/L (ref 22–32)
Calcium: 9.4 mg/dL (ref 8.9–10.3)
Chloride: 102 mmol/L (ref 98–111)
Creatinine, Ser: 0.63 mg/dL (ref 0.44–1.00)
GFR calc Af Amer: >60 mL/min (ref >60)
GFR calc non Af Amer: >60 mL/min (ref >60)
Glucose, Bld: 79 mg/dL (ref 70–99)
Potassium: 3.9 mmol/L (ref 3.5–5.1)
Sodium: 135 mmol/L (ref 135–145)
Total Bilirubin: 0.9 mg/dL (ref 0.3–1.2)
Total Protein: 8.1 g/dL (ref 6.5–8.1)

## 2018-05-18 LAB — LIPASE, BLOOD: Lipase: 23 U/L (ref 11–51)

## 2018-05-18 NOTE — ED Notes (Signed)
Called cell phone without answer.

## 2018-05-18 NOTE — ED Notes (Signed)
Called cell phone x 2 without answer.

## 2018-05-18 NOTE — ED Triage Notes (Signed)
Pt presents to ED via POV with c/o nausea and vomiting. Pt states is [redacted] weeks pregnant at this time. Pt states N/V has been going on approx 1 week.

## 2018-05-27 ENCOUNTER — Ambulatory Visit (INDEPENDENT_AMBULATORY_CARE_PROVIDER_SITE_OTHER): Payer: Medicaid Other

## 2018-05-27 ENCOUNTER — Other Ambulatory Visit (HOSPITAL_COMMUNITY)
Admission: RE | Admit: 2018-05-27 | Discharge: 2018-05-27 | Disposition: A | Discharge status: Home / Self Care | Payer: Medicaid Other | Source: Ambulatory Visit | Attending: Advanced Practice Midwife | Admitting: Advanced Practice Midwife

## 2018-05-27 ENCOUNTER — Other Ambulatory Visit: Payer: Self-pay

## 2018-05-27 ENCOUNTER — Ambulatory Visit (INDEPENDENT_AMBULATORY_CARE_PROVIDER_SITE_OTHER): Payer: Medicaid Other | Admitting: Advanced Practice Midwife

## 2018-05-27 ENCOUNTER — Encounter: Payer: Self-pay | Admitting: Advanced Practice Midwife

## 2018-05-27 DIAGNOSIS — O30039 Twin pregnancy, monochorionic/diamniotic, unspecified trimester: Secondary | ICD-10-CM | POA: Insufficient documentation

## 2018-05-27 DIAGNOSIS — Z113 Encounter for screening for infections with a predominantly sexual mode of transmission: Secondary | ICD-10-CM

## 2018-05-27 DIAGNOSIS — Z3490 Encounter for supervision of normal pregnancy, unspecified, unspecified trimester: Secondary | ICD-10-CM

## 2018-05-27 DIAGNOSIS — O30041 Twin pregnancy, dichorionic/diamniotic, first trimester: Secondary | ICD-10-CM

## 2018-05-27 DIAGNOSIS — Z3A08 8 weeks gestation of pregnancy: Secondary | ICD-10-CM

## 2018-05-27 DIAGNOSIS — O0990 Supervision of high risk pregnancy, unspecified, unspecified trimester: Secondary | ICD-10-CM | POA: Diagnosis not present

## 2018-05-27 DIAGNOSIS — R300 Dysuria: Secondary | ICD-10-CM

## 2018-05-27 DIAGNOSIS — O26891 Other specified pregnancy related conditions, first trimester: Secondary | ICD-10-CM

## 2018-05-27 DIAGNOSIS — O99210 Obesity complicating pregnancy, unspecified trimester: Secondary | ICD-10-CM | POA: Insufficient documentation

## 2018-05-27 LAB — POCT URINALYSIS DIPSTICK
Bilirubin, UA: NEGATIVE
Blood, UA: NEGATIVE
Glucose, UA: NEGATIVE
Ketones, UA: NEGATIVE
Protein, UA: NEGATIVE
Spec Grav, UA: 1.02 (ref 1.010–1.025)
Urobilinogen, UA: NEGATIVE E.U./dL — AB
pH, UA: 7.5 (ref 5.0–8.0)

## 2018-05-27 MED ORDER — CEPHALEXIN 500 MG PO CAPS: 500.0000 mg | ORAL_CAPSULE | Freq: Three times a day (TID) | ORAL | 0 refills | Status: DC

## 2018-05-27 NOTE — Progress Notes (Signed)
Routine Prenatal Care Visit  Subjective  Katelyn Mendoza is a 28 y.o. G3P1011 at 1939w5d being seen today for ongoing prenatal care.  She is currently monitored for the following issues for this high-risk pregnancy and has Vulvovaginal pruritus; Supervision of high-risk pregnancy; HSV infection; Nausea and vomiting during pregnancy; and Twin pregnancy, monochorionic/diamniotic, unspecified trimester on their problem list.  ----------------------------------------------------------------------------------- Patient reports abdominal and back pain. Her urine has a strong odor. She was treated recently for BV and Yeast and is requesting testing today.  She declines genetic screening.  .  .   . Denies leaking of fluid.  ----------------------------------------------------------------------------------- The following portions of the patient's history were reviewed and updated as appropriate: allergies, current medications, past family history, past medical history, past social history, past surgical history and problem list. Problem list updated.   Objective  Last menstrual period 03/27/2018 Pregravid weight 160 lb (72.6 kg) Total Weight Gain 10 lb (4.536 kg) Urinalysis: Urine Protein    Urine Glucose    Results for Katelyn DemarkMOORE, Katelyn M (MRN [id number]) as of 05/27/2018 16:18  Ref. Range 05/27/2018 16:06  Bilirubin, UA Unknown neg  Glucose Latest Ref Range: Negative  Negative  Ketones, UA Unknown neg  Leukocytes,UA Latest Ref Range: Negative  Large (3+) (A)  Nitrite, UA Unknown large  pH, UA Latest Ref Range: 5.0 - 8.0  7.5  Protein,UA Latest Ref Range: Negative  Negative  Specific Gravity, UA Latest Ref Range: 1.010 - 1.025  1.020  Urobilinogen, UA Latest Ref Range: 0.2 or 1.0 E.U./dL negative (A)  RBC, UA Unknown neg  Odor Unknown yes    Fetal Status:           Patient Name: Katelyn Mendoza DOB: 10/05/1990 MRN: [id number] ULTRASOUND REPORT  Location: [facility] Date of Service: 05/27/2018    Indications:dating Findings:  Twin Monochorionic/Diamniotic intrauterine pregnancy is visualized with  A CRL consistent with 5430w2d gestation, giving an (U/S) EDD of 01/04/2019.  B CRL consistent with 6730w2d gestation, giving an (U/S) EDD of 01/04/2019. The (U/S) EDD is consistent with the clinically established EDD of 01/01/2019.  Twin A  FHR: 186 BPM CRL measurement: 18.3 mm Yolk sac is visualized and appears normal and early anatomy is normal. Amnion: visualized and appears normal   Twin B  FHR: 184 BPM CRL measurement: 20.4 mm Yolk sac is visualized and appears normal and early anatomy is normal. Amnion: visualized and appears normal   Right Ovary is normal in appearance. Left Ovary is normal appearance. Corpus luteal cyst:  is not visualized Survey of the adnexa demonstrates no adnexal masses. There is no free peritoneal fluid in the cul de sac.  Impression: 1. 2130w2d Twin Monochorionic/Diamniotic intrauterine pregnancy is visualized. 2. (U/S) EDD is consistent with Clinically established EDD of 01/01/2019.  Recommendations: 1.Clinical correlation with the patient's History and Physical Exam.   Clydene LamingJenine M Alessi, RDMS  General:  Alert, oriented and cooperative. Patient is in no acute distress.  Skin: Skin is warm and dry. No rash noted.   Cardiovascular: Normal heart rate noted  Respiratory: Normal respiratory effort, no problems with respiration noted  Abdomen: Soft, gravid, appropriate for gestational age.       Pelvic:  Cervical exam deferred        Extremities: Normal range of motion.     Mental Status: Normal mood and affect. Normal behavior. Normal judgment and thought content.   Assessment   28 y.o. G3P1011 at 3839w5d by  01/01/2019, by Last Menstrual Period presenting for  routine prenatal visit  Plan   pregnancy3 Problems (from 03/27/18 to present)    Problem Noted Resolved   Twin pregnancy, monochorionic/diamniotic, unspecified trimester 05/27/2018  by [name], CNM No   Supervision of high-risk pregnancy 05/10/2018 by Nadara Mustard, MD No   Overview Signed 05/10/2018  9:59 AM by Nadara Mustard, MD    Clinic [facility] Prenatal Labs  Dating  Blood type:     Genetic Screen 1 Screen:    AFP:     Quad:     NIPS: Antibody:   Anatomic [place]  Rubella:   Varicella: @VZVIGG @  GTT Early:               Third trimester:  RPR:     Rhogam  HBsAg:     TDaP vaccine                       Flu Shot: HIV:     Baby Food                                GBS:   Contraception  Pap:  Need Valtrex suppression   CS/VBAC N/a   Support Person                Preterm labor symptoms and general obstetric precautions including but not limited to vaginal bleeding, contractions, leaking of fluid and fetal movement were reviewed in detail with the patient. Please refer to After Visit Summary for other counseling recommendations. Aptima sent  Rx Keflex sent for presumed UTI  Return in about 6 weeks (around 07/08/2018) for early 1 hr gtt/NOB/sickle cell and ROB.  [name], CNM 05/27/2018 4:01 PM

## 2018-05-27 NOTE — Progress Notes (Signed)
Dating scan today. No vb. No lof.  °

## 2018-05-27 NOTE — Patient Instructions (Addendum)
Multiple Pregnancy Having a multiple pregnancy means that a woman is carrying more than one baby at a time. She may be pregnant with twins, triplets, or more. The majority of multiple pregnancies are twins. Naturally conceiving triplets or more (higher-order multiples) is rare. Multiple pregnancies are riskier than single pregnancies. A woman with a multiple pregnancy is more likely to have certain problems during her pregnancy. Therefore, she will need to have more frequent appointments for prenatal care. How does a multiple pregnancy happen? A multiple pregnancy happens when:  The woman's body releases more than one egg at a time, and then each egg gets fertilized by a different sperm. ? This is the most common type of multiple pregnancy. ? Twins or other multiples produced this way are fraternal. They are no more alike than non-multiple siblings are.  One sperm fertilizes one egg, which then divides into more than one embryo. ? Twins or other multiples produced this way are identical. Identical multiples are always the same gender, and they look very much alike. Who is most likely to have a multiple pregnancy? A multiple pregnancy is more likely to develop in women who:  Have had fertility treatment, especially if the treatment included fertility drugs.  Are older than 28 years of age.  Have already had four or more children.  Have a family history of multiple pregnancy. How is a multiple pregnancy diagnosed? A multiple pregnancy may be diagnosed based on:  Symptoms such as: ? Rapid weight gain in the first 3 months of pregnancy (first trimester). ? More severe nausea and breast tenderness than what is typical of a single pregnancy. ? The uterus measuring larger than what is normal for the stage of the pregnancy.  Blood tests that detect a higher-than-normal level of human chorionic gonadotropin (hCG). This is a hormone that your body produces in early pregnancy.  Ultrasound exam.  This is used to confirm that you are carrying multiples. What risks are associated with multiple pregnancy? A multiple pregnancy puts you at a higher risk for certain problems during or after your pregnancy, including:  Having your babies delivered before you have reached a full-term pregnancy (preterm birth). A full-term pregnancy lasts for at least 37 weeks. Babies born before 19 weeks may have a higher risk of a variety of health problems, such as breathing problems, feeding difficulties, cerebral palsy, and learning disabilities.  Diabetes.  Preeclampsia. This is a serious condition that causes high blood pressure along with other symptoms, such as swelling and headaches, during pregnancy.  Excessive blood loss after childbirth (postpartum hemorrhage).  Postpartum depression.  Low birth weight of the babies. How will having a multiple pregnancy affect my care? Your health care provider will want to monitor you more closely during your pregnancy to make sure that your babies are growing normally and that you are healthy. Follow these instructions at home: Because your pregnancy is considered to be high risk, you will need to work closely with your health care team. You may also need to make some lifestyle changes. These may include the following: Eating and drinking  Increase your nutrition. ? Follow your health care providers recommendations for weight gain. You may need to gain a little extra weight when you are pregnant with multiples. ? Eat healthy snacks often throughout the day. This can add calories and reduce nausea.  Drink enough fluid to keep your urine pale yellow.  Take prenatal vitamins. Activity By 20-24 weeks, you may need to limit your activities.  Avoid activities and work that take a lot of effort (are strenuous).  Ask your health care provider when you should stop having sexual intercourse.  Rest often. General instructions  Do not use any products that  contain nicotine or tobacco, such as cigarettes and e-cigarettes. If you need help quitting, ask your health care provider.  Do not drink alcohol or use illegal drugs.  Take over-the-counter and prescription medicines only as told by your health care provider.  Arrange for extra help around the house.  Keep all follow-up visits and all prenatal visits as told by your health care provider. This is important. Contact a health care provider if:  You have dizziness.  You have persistent nausea, vomiting, or diarrhea.  You are having trouble gaining weight.  You have feelings of depression or other emotions that are interfering with your normal activities. Get help right away if:  You have a fever.  You have pain with urination.  You have fluid leaking from your vagina.  You have a bad-smelling vaginal discharge.  You notice increased swelling in your face, hands, legs, or ankles.  You have spotting or bleeding from your vagina.  You have pelvic cramps, pelvic pressure, or nagging pain in your abdomen or lower back.  You are having regular contractions.  You develop a severe headache, with or without visual changes.  You have shortness of breath or chest pain.  You notice less fetal movement, or no fetal movement. Summary  Having a multiple pregnancy means that a woman is carrying more than one baby at a time.  A multiple pregnancy puts you at a higher risk for certain problems during and after your pregnancy, such as: having your babies delivered before you have reached a full-term pregnancy (preterm birth), diabetes, preeclampsia, excessive blood loss after childbirth (postpartum hemorrhage), postpartum depression, or low birth weight of the babies.  Your health care provider will want to monitor you more closely during your pregnancy to make sure that your babies are growing normally and that you are healthy.  You may need to make some lifestyle changes during  pregnancy, including: increasing your nutrition, limiting your activities after 20-24 weeks of pregnancy, and arranging for extra help around the house. This information is not intended to replace advice given to you by your health care provider. Make sure you discuss any questions you have with your health care provider. Document Released: 10/08/2007 Document Revised: 09/23/2016 Document Reviewed: 08/30/2015 Elsevier Interactive Patient Education  2019 [employer]. Common Medications Safe in Pregnancy  Acne:      Constipation:  Benzoyl Peroxide     Colace  Clindamycin      Dulcolax Suppository  Topica Erythromycin     Fibercon  Salicylic Acid      Metamucil         Miralax AVOID:        Senakot   Accutane    Cough:  Retin-A       Cough Drops  Tetracycline      Phenergan w/ Codeine if Rx  Minocycline      Robitussin (Plain & DM)  Antibiotics:     Crabs/Lice:  Ceclor       RID  Cephalosporins    AVOID:  E-Mycins      Kwell  Keflex  Macrobid/Macrodantin   Diarrhea:  Penicillin      Kao-Pectate  Zithromax      Imodium AD         PUSH FLUIDS AVOID:  Cipro     Fever:  Tetracycline      Tylenol (Regular or Extra  Minocycline       Strength)  Levaquin      Extra Strength-Do not          Exceed 8 tabs/24 hrs Caffeine:        <283m/day (equiv. To 1 cup of coffee or  approx. 3 12 oz sodas)         Gas: Cold/Hayfever:       Gas-X  Benadryl      Mylicon  Claritin       Phazyme  **Claritin-D        Chlor-Trimeton    Headaches:  Dimetapp      ASA-Free Excedrin  Drixoral-Non-Drowsy     Cold Compress  Mucinex (Guaifenasin)     Tylenol (Regular or Extra  Sudafed/Sudafed-12 Hour     Strength)  **Sudafed PE Pseudoephedrine   Tylenol Cold & Sinus     Vicks Vapor Rub  Zyrtec  **AVOID if Problems With Blood Pressure         Heartburn: Avoid lying down for at least 1 hour after meals  Aciphex      Maalox     Rash:  Milk of Magnesia     Benadryl    Mylanta       1%  Hydrocortisone Cream  Pepcid  Pepcid Complete   Sleep Aids:  Prevacid      Ambien   Prilosec       Benadryl  Rolaids       Chamomile Tea  Tums (Limit 4/day)     Unisom  Zantac       Tylenol PM         Warm milk-add vanilla or  Hemorrhoids:       Sugar for taste  Anusol/Anusol H.C.  (RX: Analapram 2.5%)  Sugar Substitutes:  Hydrocortisone OTC     Ok in moderation  Preparation H      Tucks        Vaseline lotion applied to tissue with wiping    Herpes:     Throat:  Acyclovir      Oragel  Famvir  Valtrex     Vaccines:         Flu Shot Leg Cramps:       *Gardasil  Benadryl      Hepatitis A         Hepatitis B Nasal Spray:       Pneumovax  Saline Nasal Spray     Polio Booster         Tetanus Nausea:       Tuberculosis test or PPD  Vitamin B6 25 mg TID   AVOID:    Dramamine      *Gardasil  Emetrol       Live Poliovirus  Ginger Root 250 mg QID    MMR (measles, mumps &  High Complex Carbs @ Bedtime    rebella)  Sea Bands-Accupressure    Varicella (Chickenpox)  Unisom 1/2 tab TID     *No known complications           If received before Pain:         Known pregnancy;   Darvocet       Resume series after  Lortab        Delivery  Percocet    Yeast:   Tramadol      Femstat  Tylenol 3  Gyne-lotrimin  Ultram       Monistat  Vicodin           MISC:         All Sunscreens           Hair Coloring/highlights          Insect Repellant's          (Including DEET)         Mystic Tans Exercise During Pregnancy For people of all ages, exercise is an important part of being healthy. Exercise improves heart and lung function and helps to maintain strength, flexibility, and a healthy body weight. Exercise also boosts energy levels and elevates mood. For most women, maintaining an exercise routine throughout pregnancy is recommended. It is only on rare occasions and with certain medical conditions or pregnancy complications that women may be asked to limit or avoid exercise during  pregnancy. What are some other benefits to exercising during pregnancy? Along with maintaining strength and flexibility, exercising throughout pregnancy can help to:  Keep strength in muscles that are very important during labor and childbirth.  Decrease low back pain during pregnancy.  Decrease the risk of developing gestational diabetes mellitus (GDM).  Improve blood sugar (glucose) control for women who have GDM.  Decrease the risk of developing preeclampsia. This is a serious condition that causes high blood pressure along with other symptoms, such as swelling and headaches.  Decrease the risk of cesarean delivery.  Speed up the recovery after giving birth. How often should I exercise? Unless your health care provider gives you different instructions, you should try to exercise on most days or all days of the week. In general, try to exercise with moderate intensity for about 150 minutes per week. This can be spread out across several days, such as exercising for 30 minutes per day on 5 days of each week. You can tell that you are exercising at a moderate intensity if you have a higher heart rate and faster breathing, but you are still able to hold a conversation. What types of moderate-intensity exercise are recommended during pregnancy? There are many types of exercise that are safe for you to do during pregnancy. Unless your health care provider gives you different instructions, do a variety of exercises that safely increase your heart and breathing (cardiopulmonary) rates and help you to build and maintain muscle strength (strength training). You should always be able to talk in full sentences while exercising during pregnancy. Some examples of exercising that is safe to do during pregnancy include:  Brisk walking or hiking.  Swimming.  Water aerobics.  Riding a stationary bike.  Strength training.  Modified yoga or Pilates. Tell your instructor that you are pregnant. Avoid  overstretching and avoid lying on your back for long periods of time.  Running or jogging. Only choose this type of exercise if: ? You ran or jogged regularly before your pregnancy. ? You can run or jog and still talk in complete sentences. What types of exercise should I not do during pregnancy? Depending on your level of fitness and whether you exercised regularly before your pregnancy, you may be advised to limit vigorous-intensity exercise during your pregnancy. You can tell that you are exercising at a vigorous intensity if you are breathing much harder and faster and cannot hold a conversation while exercising. Some examples of exercising that you should avoid during pregnancy include:  Contact sports.  Activities that place you at risk for falling on or being  hit in the belly, such as downhill skiing, water skiing, surfing, rock climbing, cycling, gymnastics, and horseback riding.  Scuba diving.  Sky diving.  Yoga or Pilates in a room that is heated to extreme temperatures ("hot yoga" or "hot Pilates").  Jogging or running, unless you ran or jogged regularly before your pregnancy. While jogging or running, you should always be able to talk in full sentences. Do not run or jog so vigorously that you are unable to have a conversation.  If you are not used to exercising at elevation (more than 6,000 feet above sea level), do not do so during your pregnancy. When should I avoid exercising during pregnancy? Certain medical conditions can make it unsafe to exercise during pregnancy, or they may increase your risk of miscarriage or early labor and birth. Some of these conditions include:  Some types of heart disease.  Some types of lung disease.  Placenta previa. This is when the placenta partially or completely covers the opening of the uterus (cervix).  Frequent bleeding from the vagina during your pregnancy.  Incompetent cervix. This is when your cervix does not remain as tightly  closed during pregnancy as it should.  Premature labor.  Ruptured membranes. This is when the protective sac (amniotic sac) opens up and amniotic fluid leaks from your vagina.  Severely low blood count (anemia).  Preeclampsia or pregnancy-caused high blood pressure.  Carrying more than one baby (multiple gestation) and having an additional risk of early labor.  Poorly controlled diabetes.  Being severely underweight or severely overweight.  Intrauterine growth restriction. This is when your baby's growth and development during pregnancy are slower than expected.  Other medical conditions. Ask your health care provider if any apply to you. What else should I know about exercising during pregnancy? You should take these precautions while exercising during pregnancy:  Avoid overheating. ? Wear loose-fitting, breathable clothes. ? Do not exercise in very high temperatures.  Avoid dehydration. Drink enough water before, during, and after exercise to keep your urine clear or pale yellow.  Avoid overstretching. Because of hormone changes during pregnancy, it is easy to overstretch muscles, tendons, and ligaments during pregnancy.  Start slowly and ask your health care provider to recommend types of exercise that are safe for you, if exercising regularly is new for you. Pregnancy is not a time for exercising to lose weight. When should I seek medical care? You should stop exercising and call your health care provider if you have any unusual symptoms, such as:  Mild uterine contractions or abdominal cramping.  Dizziness that does not improve with rest. When should I seek immediate medical care? You should stop exercising and call your local emergency services (911 in the [place] if you have any unusual symptoms, such as:  Sudden, severe pain in your low back or your belly.  Uterine contractions or abdominal cramping that do not improve with rest.  Chest pain.  Bleeding or fluid  leaking from your vagina.  Shortness of breath. This information is not intended to replace advice given to you by your health care provider. Make sure you discuss any questions you have with your health care provider. Document Released: 12/29/2004 Document Revised: 05/29/2015 Document Reviewed: 03/08/2014 Elsevier Interactive Patient Education  2019 [place] for Pregnant Women While you are pregnant, your body requires additional nutrition to help support your growing baby. You also have a higher need for some vitamins and minerals, such as folic acid, calcium, iron, and vitamin D.  Eating a healthy, well-balanced diet is very important for your health and your baby's health. Your need for extra calories varies for the three 32-monthsegments of your pregnancy (trimesters). For most women, it is recommended to consume:  150 extra calories a day during the first trimester.  300 extra calories a day during the second trimester.  300 extra calories a day during the third trimester. What are tips for following this plan?   Do not try to lose weight or go on a diet during pregnancy.  Limit your overall intake of foods that have "empty calories." These are foods that have little nutritional value, such as sweets, desserts, candies, and sugar-sweetened beverages.  Eat a variety of foods (especially fruits and vegetables) to get a full range of vitamins and minerals.  Take a prenatal vitamin to help meet your additional vitamin and mineral needs during pregnancy, specifically for folic acid, iron, calcium, and vitamin D.  Remember to stay active. Ask your health care provider what types of exercise and activities are safe for you.  Practice good food safety and cleanliness. Wash your hands before you eat and after you prepare raw meat. Wash all fruits and vegetables well before peeling or eating. Taking these actions can help to prevent food-borne illnesses that can be very  dangerous to your baby, such as listeriosis. Ask your health care provider for more information about listeriosis. What does 150 extra calories look like? Healthy options that provide 150 extra calories each day could be any of the following:  6-8 oz (170-230 g) of plain low-fat yogurt with  cup of berries.  1 apple with 2 teaspoons (11 g) of peanut butter.  Cut-up vegetables with  cup (60 g) of hummus.  8 oz (230 mL) or 1 cup of low-fat chocolate milk.  1 stick of string cheese with 1 medium orange.  1 peanut butter and jelly sandwich that is made with one slice of whole-wheat bread and 1 tsp (5 g) of peanut butter. For 300 extra calories, you could eat two of those healthy options each day. What is a healthy amount of weight to gain? The right amount of weight gain for you is based on your BMI before you became pregnant. If your BMI:  Was less than 18 (underweight), you should gain 28-40 lb (13-18 kg).  Was 18-24.9 (normal), you should gain 25-35 lb (11-16 kg).  Was 25-29.9 (overweight), you should gain 15-25 lb (7-11 kg).  Was 30 or greater (obese), you should gain 11-20 lb (5-9 kg). What if I am having twins or multiples? Generally, if you are carrying twins or multiples:  You may need to eat 300-600 extra calories a day.  The recommended range for total weight gain is 25-54 lb (11-25 kg), depending on your BMI before pregnancy.  Talk with your health care provider to find out about nutritional needs, weight gain, and exercise that is right for you. What foods can I eat?  Grains All grains. Choose whole grains, such as whole-wheat bread, oatmeal, or brown rice. Vegetables All vegetables. Eat a variety of colors and types of vegetables. Remember to wash your vegetables well before peeling or eating. Fruits All fruits. Eat a variety of colors and types of fruit. Remember to wash your fruits well before peeling or eating. Meats and other protein foods Lean meats,  including chicken, [place] fish, and lean cuts of beef, veal, or pork. If you eat fish or seafood, choose options that are higher in omega-3  fatty acids and lower in mercury, such as salmon, herring, mussels, trout, sardines, pollock, shrimp, crab, and lobster. Tofu. Tempeh. Beans. Eggs. Peanut butter and other nut butters. Make sure that all meats, poultry, and eggs are cooked to food-safe temperatures or "well-done." Two or more servings of fish are recommended each week in order to get the most benefits from omega-3 fatty acids that are found in seafood. Choose fish that are lower in mercury. You can find more information online:  [employer] Dairy Pasteurized milk and milk alternatives (such as almond milk). Pasteurized yogurt and pasteurized cheese. Cottage cheese. Sour cream. Beverages Water. Juices that contain 100% fruit juice or vegetable juice. Caffeine-free teas and decaffeinated coffee. Drinks that contain caffeine are okay to drink, but it is better to avoid caffeine. Keep your total caffeine intake to less than 200 mg each day (which is 12 oz or 355 mL of coffee, tea, or soda) or the limit as told by your health care provider. Fats and oils Fats and oils are okay to include in moderation. Sweets and desserts Sweets and desserts are okay to include in moderation. Seasoning and other foods All pasteurized condiments. The items listed above may not be a complete list of recommended foods and beverages. Contact your dietitian for more options. What foods are not recommended? Vegetables Raw (unpasteurized) vegetable juices. Fruits Unpasteurized fruit juices. Meats and other protein foods Lunch meats, bologna, hot dogs, or other deli meats. (If you must eat those meats, reheat them until they are steaming hot.) Refrigerated pat, meat spreads from a meat counter, smoked seafood that is found in the refrigerated section of a store. Raw or undercooked meats, poultry, and eggs. Raw fish,  such as sushi or sashimi. Fish that have high mercury content, such as tilefish, shark, swordfish, and king mackerel. To learn more about mercury in fish, talk with your health care provider or look for online resources, such as:  [employer] Dairy Raw (unpasteurized) milk and any foods that have raw milk in them. Soft cheeses, such as feta, queso blanco, queso fresco, Brie, Camembert cheeses, blue-veined cheeses, and Panela cheese (unless it is made with pasteurized milk, which must be stated on the label). Beverages Alcohol. Sugar-sweetened beverages, such as sodas, teas, or energy drinks. Seasoning and other foods Homemade fermented foods and drinks, such as pickles, sauerkraut, or kombucha drinks. (Store-bought pasteurized versions of these are okay.) Salads that are made in a store or deli, such as ham salad, chicken salad, egg salad, tuna salad, and seafood salad. The items listed above may not be a complete list of foods and beverages to avoid. Contact your dietitian for more information. Where to find more information To calculate the number of calories you need based on your height, weight, and activity level, you can use an online calculator such as:  MobileTransition.ch To calculate how much weight you should gain during pregnancy, you can use an online pregnancy weight gain calculator such as:  [employer].cy Summary  While you are pregnant, your body requires additional nutrition to help support your growing baby.  Eat a variety of foods, especially fruits and vegetables to get a full range of vitamins and minerals.  Practice good food safety and cleanliness. Wash your hands before you eat and after you prepare raw meat. Wash all fruits and vegetables well before peeling or eating. Taking these actions can help to prevent food-borne illnesses, such as listeriosis, that can be very dangerous to your baby.  Do not eat raw  meat  or fish. Do not eat fish that have high mercury content, such as tilefish, shark, swordfish, and king mackerel. Do not eat unpasteurized (raw) dairy.  Take a prenatal vitamin to help meet your additional vitamin and mineral needs during pregnancy, specifically for folic acid, iron, calcium, and vitamin D. This information is not intended to replace advice given to you by your health care provider. Make sure you discuss any questions you have with your health care provider. Document Released: 10/13/2013 Document Revised: 09/25/2016 Document Reviewed: 09/25/2016 Elsevier Interactive Patient Education  2019 [employer]. Prenatal Care Prenatal care is health care during pregnancy. It helps you and your unborn baby (fetus) stay as healthy as possible. Prenatal care may be provided by a midwife, a family practice health care provider, or a childbirth and pregnancy specialist (obstetrician). How does this affect me? During pregnancy, you will be closely monitored for any new conditions that might develop. To lower your risk of pregnancy complications, you and your health care provider will talk about any underlying conditions you have. How does this affect my baby? Early and consistent prenatal care increases the chance that your baby will be healthy during pregnancy. Prenatal care lowers the risk that your baby will be:  Born early (prematurely).  Smaller than expected at birth (small for gestational age). What can I expect at the first prenatal care visit? Your first prenatal care visit will likely be the longest. You should schedule your first prenatal care visit as soon as you know that you are pregnant. Your first visit is a good time to talk about any questions or concerns you have about pregnancy. At your visit, you and your health care provider will talk about:  Your medical history, including: ? Any past pregnancies. ? Your family's medical history. ? The baby's father's medical  history. ? Any long-term (chronic) health conditions you have and how you manage them. ? Any surgeries or procedures you have had. ? Any current over-the-counter or prescription medicines, herbs, or supplements you are taking.  Other factors that could pose a risk to your baby, including:  Your home setting and your stress levels, including: ? Exposure to abuse or violence. ? Household financial strain. ? Mental health conditions you have.  Your daily health habits, including diet and exercise. Your health care provider will also:  Measure your weight, height, and blood pressure.  Do a physical exam, including a pelvic and breast exam.  Perform blood tests and urine tests to check for: ? Urinary tract infection. ? Sexually transmitted infections (STIs). ? Low iron levels in your blood (anemia). ? Blood type and certain proteins on red blood cells (Rh antibodies). ? Infections and immunity to viruses, such as hepatitis B and rubella. ? HIV (human immunodeficiency virus).  Do an ultrasound to confirm your baby's growth and development and to help predict your estimated due date (EDD). This ultrasound is done with a probe that is inserted into the vagina (transvaginal ultrasound).  Discuss your options for genetic screening.  Give you information about how to keep yourself and your baby healthy, including: ? Nutrition and taking vitamins. ? Physical activity. ? How to manage pregnancy symptoms such as nausea and vomiting (morning sickness). ? Infections and substances that may be harmful to your baby and how to avoid them. ? Food safety. ? Dental care. ? Working. ? Travel. ? Warning signs to watch for and when to call your health care provider. How often will I have prenatal care visits? After  your first prenatal care visit, you will have regular visits throughout your pregnancy. The visit schedule is often as follows:  Up to week 28 of pregnancy: once every 4 weeks.  28-36  weeks: once every 2 weeks.  After 36 weeks: every week until delivery. Some women may have visits more or less often depending on any underlying health conditions and the health of the baby. Keep all follow-up and prenatal care visits as told by your health care provider. This is important. What happens during routine prenatal care visits? Your health care provider will:  Measure your weight and blood pressure.  Check for fetal heart sounds.  Measure the height of your uterus in your abdomen (fundal height). This may be measured starting around week 20 of pregnancy.  Check the position of your baby inside your uterus.  Ask questions about your diet, sleeping patterns, and whether you can feel the baby move.  Review warning signs to watch for and signs of labor.  Ask about any pregnancy symptoms you are having and how you are dealing with them. Symptoms may include: ? Headaches. ? Nausea and vomiting. ? Vaginal discharge. ? Swelling. ? Fatigue. ? Constipation. ? Any discomfort, including back or pelvic pain. Make a list of questions to ask your health care provider at your routine visits. What tests might I have during prenatal care visits? You may have blood, urine, and imaging tests throughout your pregnancy, such as:  Urine tests to check for glucose, protein, or signs of infection.  Glucose tests to check for a form of diabetes that can develop during pregnancy (gestational diabetes mellitus). This is usually done around week 24 of pregnancy.  An ultrasound to check your baby's growth and development and to check for birth defects. This is usually done around week 20 of pregnancy.  A test to check for group B strep (GBS) infection. This is usually done around week 36 of pregnancy.  Genetic testing. This may include blood or imaging tests, such as an ultrasound. Some genetic tests are done during the first trimester and some are done during the second trimester. What else  can I expect during prenatal care visits? Your health care provider may recommend getting certain vaccines during pregnancy. These may include:  A yearly flu shot (annual influenza vaccine). This is especially important if you will be pregnant during flu season.  Tdap (tetanus, diphtheria, pertussis) vaccine. Getting this vaccine during pregnancy can protect your baby from whooping cough (pertussis) after birth. This vaccine may be recommended between weeks 27 and 36 of pregnancy. Later in your pregnancy, your health care provider may give you information about:  Childbirth and breastfeeding classes.  Choosing a health care provider for your baby.  Umbilical cord banking.  Breastfeeding.  Birth control after your baby is born.  The hospital labor and delivery unit and how to tour it.  Registering at the hospital before you go into labor. Where to find more information  Office on Women's Health: LegalWarrants.gl  American Pregnancy Association: americanpregnancy.org  March of Dimes: marchofdimes.org Summary  Prenatal care helps you and your baby stay as healthy as possible during pregnancy.  Your first prenatal care visit will most likely be the longest.  You will have visits and tests throughout your pregnancy to monitor your health and your baby's health.  Bring a list of questions to your visits to ask your health care provider.  Make sure to keep all follow-up and prenatal care visits with your health care  provider. This information is not intended to replace advice given to you by your health care provider. Make sure you discuss any questions you have with your health care provider. Document Released: 01/01/2003 Document Revised: 12/28/2016 Document Reviewed: 12/28/2016 Elsevier Interactive Patient Education  2019 [employer].    COVID-19 and Your Pregnancy FAQ  How can I prevent infection with COVID-19 during my pregnancy? Social distancing is key. Please limit  any interactions in public. Try and work from home if possible. Frequently wash your hands after touching possibly contaminated surfaces. Avoid touching your face.  Minimize trips to the store. Consider online ordering when possible.   Should I wear a mask? YES. It is recommended by the CDC that all people wear a cloth mask or facial covering in public. You should wear a mask to your visits in the office. This will help reduce transmission as well as your risk or acquiring COVID-19. New studies are showing that even asymptomatic individuals can spread the virus from talking.   Where can I get a mask? [place] and the city of [place] are partnering to provide masks to community members. You can pick up a mask from several locations. This website also has instructions about how to make a mask by sewing or without sewing by using a t-shirt or bandana.  https://www.[place].gov/i-want-to/learn-about/covid-19-information-and-updates/covid-19-face-mask-project  Studies have shown that if you were a tube or nylon stocking from pantyhose over a cloth mask it makes the cloth mask almost as effective as a N95 mask.  https://www.davis-walter.com/  What are the symptoms of COVID-19? Fever (greater than 100.4 F), dry cough, shortness of breath.  Am I more at risk for COVID-19 since I am pregnant? There is not currently data showing that pregnant women are more adversely impacted by COVID-19 than the general population. However, we know that pregnant women tend to have worse respiratory complications from similar diseases such as the flu and SARS and for this reason should be considered an at-risk population.  What do I do if I am experiencing the symptoms of COVID-19? Testing is being limited because of test availability. If you are experiencing symptoms you should quarantine  yourself, and the members of your family, for at least 2 weeks at home.   Please visit this website for more information: RunningShows.co.za.html  When should I go to the Emergency Room? Please go to the emergency room if you are experiencing ANY of these symptoms*:  1.    Difficulty breathing or shortness of breath 2.    Persistent pain or pressure in the chest 3.    Confusion or difficulty being aroused (or awakened) 4.    Bluish lips or face  *This list is not all inclusive. Please consult our office for any other symptoms that are severe or concerning.  What do I do if I am having difficulty breathing? You should go to the Emergency Room for evaluation. At this time they have a tent set up for evaluating patients with COVID-19 symptoms.   How will my prenatal care be different because of the COVID-19 pandemic? It has been recommended to reduce the frequency of face-to-face visits and use resources such as telephone and virtual visits when possible. Using a scale, blood pressure machine and fetal doppler at home can further help reduce face-to-face visits. You will be provided with additional information on this topic.  We ask that you come to your visits alone to minimize potential exposures to  COVID-19.  How can I receive childbirth education?  At this time in-person classes have been cancelled. You can register for online childbirth education, breastfeeding, and newborn care classes.  Please visit:  CyberComps.hu for more information  How will my hospital birth experience be different? The hospital is currently limiting visitors. This means that while you are in labor you can only have one person at the hospital with you. Additional family members will not be allowed to wait in the building or outside your room. Your one support person can be the father of the baby, a relative, a doula, or a friend. Once one support  person is designated that person will wear a band. This band cannot be shared with multiple people.  Nitrous Gas is not being offered for pain relief since the tubing and filter for the machine can not be sanitized in a way to guarantee prevention of transmission of COVID-19.  Nasal cannula use of oxygen for fetal indications has also been discontinued.  Currently a clear plastic sheet is being hung between mom and the delivering provider during pushing and delivery to help prevent transmission of COVID-19.      How long will I stay in the hospital for after giving birth? It is also recommended that discharge home be expedited during the COVID-19 outbreak. This means staying for 1 day after a vaginal delivery and 2 days after a cesarean section. Patients who need to stay longer for medical reasons are allowed to do so, but the goal will be for expedited discharge home.   What if I have COVID-19 and I am in labor? We ask that you wear a mask while on labor and delivery. We will try and accommodate you being placed in a room that is capable of filtering the air. Please call ahead if you are in labor and on your way to the hospital. The phone number for labor and delivery at [facility] is [phone].  If I have COVID-19 when my baby is born how can I prevent my baby from contracting COVID-19? This is an issue that will have to be discussed on a case-by-case basis. Current recommendations suggest providing separate isolation rooms for both the mother and new infant as well as limiting visitors. However, there are practical challenges to this recommendation. The situation will assuredly change and decisions will be influenced by the desires of the mother and availability of space.  Some suggestions are the use of a curtain or physical barrier between mom and infant, hand hygiene, mom wearing a mask, or 6 feet of spacing between a mom and infant.   Can I breastfeed during the  COVID-19 pandemic?   Yes, breastfeeding is encouraged.  Can I breastfeed if I have COVID-19? Yes. Covid-19 has not been found in breast milk. This means you cannot give COVID-19 to your child through breast milk. Breast feeding will also help pass antibodies to fight infection to your baby.   What precautions should I take when breastfeeding if I have COVID-19? If a mother and newborn do room-in and the mother wishes to feed at the breast, she should put on a facemask and practice hand hygiene before each feeding.  What precautions should I take when pumping if I have COVID-19? Prior to expressing breast milk, mothers should practice hand hygiene. After each pumping session, all parts that come into contact with breast milk should be thoroughly washed and the entire pump should be appropriately disinfected per the manufacturers instructions. This expressed breast milk should be fed to  the newborn by a healthy caregiver.  What if I am pregnant and work in healthcare? Based on limited data regarding COVID-19 and pregnancy, ACOG currently does not propose creating additional restrictions on pregnant health care personnel because of COVID-19 alone. Pregnant women do not appear to be at higher risk of severe disease related to COVID-19. Pregnant health care personnel should follow CDC risk assessment and infection control guidelines for health care personnel exposed to patients with suspected or confirmed COVID-19. Adherence to recommended infection prevention and control practices is an important part of protecting all health care personnel in health care settings.    Information on COVID-19 in pregnancy is very limited; however, facilities may want to consider limiting exposure of pregnant health care personnel to patients with confirmed or suspected COVID-19 infection, especially during higher-risk procedures (eg, aerosol-generating procedures), if feasible, based on staffing  availability.     Hello,  Given the current COVID-19 pandemic, our practice is making changes in how we are providing care to our patients. We are limiting in-person visits for the safety of all of our patients.   As a practice, we have met to discuss the best way to minimize visits, but still provide excellent care to our expecting mothers.  We have decided on the following visit structure for low-risk pregnancies.  Initial Pregnancy visit will be conducted as a telephone or web visit.  Between 10-14 weeks  there will be one in-person visit for an ultrasound, lab work, and genetic screening. 20 weeks in-person visit with an anatomy ultrasound  28 weeks in-person office visit for a 1-hour glucose test and a TDAP vaccination 32 weeks in-person office visit 34 weeks telephone visit 36 weeks in-person office visit for GBS, chlamydia, and gonorrhea testing 38 weeks in-person office visit 40 weeks in-person office visit  Understandably, some patients will require more visits than what is outlined above. Additional visits will be determined on a case-by-case basis.   We will, as always, be available for emergencies or to address concerns that might arise between in-person visits. We ask that you allow [place] the opportunity to address any concerns over the phone or through a virtual visit first. We will be available to return your phone calls throughout the day.   If you are able to purchase a scale, a blood pressure machine, and a home fetal doppler visits could be limited further. This will help decrease your exposure risks, but these purchases are not a necessity.   Things seem to change daily and there is the possibility that this structure could change, please be patient as we adapt to a new way of caring for patients.   Thank you for trusting [place] with your prenatal care. Our practice values you and looks forward to providing you with excellent care.   Sincerely,   [facility], [place]

## 2018-05-31 LAB — CERVICOVAGINAL ANCILLARY ONLY
Bacterial vaginitis: NEGATIVE
Candida vaginitis: POSITIVE — AB
Chlamydia: NEGATIVE
Neisseria Gonorrhea: NEGATIVE
Trichomonas: NEGATIVE

## 2018-06-02 ENCOUNTER — Telehealth: Payer: Self-pay

## 2018-06-02 MED ORDER — TERCONAZOLE 0.4 % VA CREA: 1.0000 | TOPICAL_CREAM | Freq: Every day | VAGINAL | 0 refills | Status: DC

## 2018-06-02 NOTE — Telephone Encounter (Signed)
Spoke w/pt. She states she is having itching/irritation with scant discharge since yesterday. Abx started on 05/27/2018

## 2018-06-02 NOTE — Telephone Encounter (Signed)
Terazol for yeast infection called in to [facility]

## 2018-06-02 NOTE — Telephone Encounter (Signed)
Patient states she needs another rx for yeast infection. She is on Abx for UTI & is having symptoms. Cb#[phone]

## 2018-06-02 NOTE — Telephone Encounter (Signed)
Pt aware.

## 2018-06-14 ENCOUNTER — Other Ambulatory Visit: Payer: Self-pay

## 2018-06-14 ENCOUNTER — Telehealth: Payer: Self-pay

## 2018-06-14 ENCOUNTER — Emergency Department
Admission: EM | Admit: 2018-06-14 | Discharge: 2018-06-14 | Disposition: A | Discharge status: Home / Self Care | Payer: Medicaid Other

## 2018-06-14 NOTE — Telephone Encounter (Signed)
Pt states she had her Ultrasound on 5/15. The pictures have another patients name.   I looked at the Ultrasound report and it does have the correct patient name on that. I could not get access to the pictures.  Cornelius Moras advised to forward message to [facility].

## 2018-06-15 ENCOUNTER — Ambulatory Visit (INDEPENDENT_AMBULATORY_CARE_PROVIDER_SITE_OTHER): Payer: Medicaid Other | Admitting: Obstetrics and Gynecology

## 2018-06-15 ENCOUNTER — Ambulatory Visit (INDEPENDENT_AMBULATORY_CARE_PROVIDER_SITE_OTHER): Payer: Medicaid Other

## 2018-06-15 ENCOUNTER — Encounter: Payer: Self-pay | Admitting: Obstetrics and Gynecology

## 2018-06-15 ENCOUNTER — Other Ambulatory Visit: Payer: Self-pay

## 2018-06-15 ENCOUNTER — Other Ambulatory Visit: Payer: Self-pay | Admitting: Obstetrics and Gynecology

## 2018-06-15 VITALS — BP 108/62 | Wt 166.0 lb

## 2018-06-15 DIAGNOSIS — N760 Acute vaginitis: Secondary | ICD-10-CM | POA: Diagnosis not present

## 2018-06-15 DIAGNOSIS — O30031 Twin pregnancy, monochorionic/diamniotic, first trimester: Secondary | ICD-10-CM

## 2018-06-15 DIAGNOSIS — Z3A11 11 weeks gestation of pregnancy: Secondary | ICD-10-CM

## 2018-06-15 DIAGNOSIS — O30039 Twin pregnancy, monochorionic/diamniotic, unspecified trimester: Secondary | ICD-10-CM

## 2018-06-15 DIAGNOSIS — O0991 Supervision of high risk pregnancy, unspecified, first trimester: Secondary | ICD-10-CM

## 2018-06-15 MED ORDER — TERCONAZOLE 0.4 % VA CREA: 1.0000 | TOPICAL_CREAM | Freq: Every day | VAGINAL | 1 refills | Status: AC

## 2018-06-15 NOTE — Progress Notes (Signed)
Routine Prenatal Care Visit  Subjective  Clent DemarkCieria M Arguijo is a 28 y.o. G3P1011 at 4326w3d being seen today for ongoing prenatal care.  She is currently monitored for the following issues for this high-risk pregnancy and has Vulvovaginal pruritus; Supervision of high-risk pregnancy; HSV infection; Nausea and vomiting during pregnancy; Twin pregnancy, monochorionic/diamniotic, unspecified trimester; and Obesity affecting pregnancy on their problem list.  ----------------------------------------------------------------------------------- Patient reports labial itching.   Contractions: Not present. Vag. Bleeding: None.  Movement: Absent. Denies leaking of fluid.  ----------------------------------------------------------------------------------- The following portions of the patient's history were reviewed and updated as appropriate: allergies, current medications, past family history, past medical history, past social history, past surgical history and problem list. Problem list updated.   Objective  Blood pressure 108/62, weight 166 lb (75.3 kg), last menstrual period 03/27/2018, unknown if currently breastfeeding. Pregravid weight 160 lb (72.6 kg) Total Weight Gain 6 lb (2.722 kg) Urinalysis:      Fetal Status: Fetal Heart Rate (bpm): 176/171   Movement: Absent     General:  Alert, oriented and cooperative. Patient is in no acute distress.  Skin: Skin is warm and dry. No rash noted.   Cardiovascular: Normal heart rate noted  Respiratory: Normal respiratory effort, no problems with respiration noted  Abdomen: Soft, gravid, appropriate for gestational age. Pain/Pressure: Present     Pelvic:  Cervical exam performed        Extremities: Normal range of motion.  Edema: None  Mental Status: Normal mood and affect. Normal behavior. Normal judgment and thought content.   Wet Prep: Clue Cells: Negative Fungal elements: Positive Trichomonas: Negative   Assessment   27 y.o. G3P1011 at 2726w3d  by  01/01/2019, by Last Menstrual Period presenting for routine prenatal visit  Plan   pregnancy3 Problems (from 03/27/18 to present)    Problem Noted Resolved   Twin pregnancy, monochorionic/diamniotic, unspecified trimester 05/27/2018 by Tresea MallGledhill, Jane, CNM No   Obesity affecting pregnancy 05/27/2018 by Tresea MallGledhill, Jane, CNM No   Supervision of high-risk pregnancy 05/10/2018 by Nadara MustardHarris, Robert P, MD No   Overview Signed 05/10/2018  9:59 AM by Nadara MustardHarris, Robert P, MD    [facility]renatal [facility]  Dating  Blood type:     Genetic Screen 1 Screen:    AFP:     Quad:     NIPS: Antibody:   Anatomic US  Rubella:   Varicella: @VZVIGG @  GTT Early:               Third trimester:  RPR:     Rhogam  HBsAg:     TDaP vaccine                       Flu Shot: HIV:     Baby Food                                GBS:   Contraception  Pap:  Need Valtrex suppression   CS/VBAC N/a   Support Person                Gestational age appropriate obstetric precautions including but not limited to vaginal bleeding, contractions, leaking of fluid and fetal movement were reviewed in detail with the patient.    Return for prenatal as scheduled.  Yeast on wet mount will treat with terazol vaginal insert for 7 days then once a week for 3 weeks.  Discussed not washing with  soap and taking a probiotic to help prevent recurrent yeast infections.  No HSV ulcers seen. Patient is taking daily suppression medication. Will place referral for MFM regarding monochorionic pregnancy. Discussed increased monitoring and risks with patient.  Declines genetic screening Visit today is after lab was closed, needs NOB labs at next visit.   Adelene Idler MD [facility], [facility] 06/15/2018 9:11 PM

## 2018-06-15 NOTE — Progress Notes (Signed)
ROB/US C/o yeast infection, pain on labia, no discharge

## 2018-06-15 NOTE — Telephone Encounter (Signed)
Per Celine Ahr at front desk. Patient called again upset that she didn't get a return call yesterday.

## 2018-06-15 NOTE — Telephone Encounter (Signed)
Spoke w/sonographer who states the information can't be changed from a previous scan to get correct name on pictures. Pt needs to be come in to be scanned again opening at 4pm today. Patient notified. Patient states she is still having issues with yeast infection & would like to be rechecked. Patient is scheduled for 4pm u/s & 430 w/[facility] for f/u yeast.

## 2018-06-15 NOTE — Patient Instructions (Signed)
Multiple Pregnancy Having a multiple pregnancy means that a woman is carrying more than one baby at a time. She may be pregnant with twins, triplets, or more. The majority of multiple pregnancies are twins. Naturally conceiving triplets or more (higher-order multiples) is rare. Multiple pregnancies are riskier than single pregnancies. A woman with a multiple pregnancy is more likely to have certain problems during her pregnancy. Therefore, she will need to have more frequent appointments for prenatal care. How does a multiple pregnancy happen? A multiple pregnancy happens when:  The woman's body releases more than one egg at a time, and then each egg gets fertilized by a different sperm. ? This is the most common type of multiple pregnancy. ? Twins or other multiples produced this way are fraternal. They are no more alike than non-multiple siblings are.  One sperm fertilizes one egg, which then divides into more than one embryo. ? Twins or other multiples produced this way are identical. Identical multiples are always the same gender, and they look very much alike. Who is most likely to have a multiple pregnancy? A multiple pregnancy is more likely to develop in women who:  Have had fertility treatment, especially if the treatment included fertility drugs.  Are older than 28 years of age.  Have already had four or more children.  Have a family history of multiple pregnancy. How is a multiple pregnancy diagnosed? A multiple pregnancy may be diagnosed based on:  Symptoms such as: ? Rapid weight gain in the first 3 months of pregnancy (first trimester). ? More severe nausea and breast tenderness than what is typical of a single pregnancy. ? The uterus measuring larger than what is normal for the stage of the pregnancy.  Blood tests that detect a higher-than-normal level of human chorionic gonadotropin (hCG). This is a hormone that your body produces in early pregnancy.  Ultrasound exam.  This is used to confirm that you are carrying multiples. What risks are associated with multiple pregnancy? A multiple pregnancy puts you at a higher risk for certain problems during or after your pregnancy, including:  Having your babies delivered before you have reached a full-term pregnancy (preterm birth). A full-term pregnancy lasts for at least 37 weeks. Babies born before 37 weeks may have a higher risk of a variety of health problems, such as breathing problems, feeding difficulties, cerebral palsy, and learning disabilities.  Diabetes.  Preeclampsia. This is a serious condition that causes high blood pressure along with other symptoms, such as swelling and headaches, during pregnancy.  Excessive blood loss after childbirth (postpartum hemorrhage).  Postpartum depression.  Low birth weight of the babies. How will having a multiple pregnancy affect my care? Your health care provider will want to monitor you more closely during your pregnancy to make sure that your babies are growing normally and that you are healthy. Follow these instructions at home: Because your pregnancy is considered to be high risk, you will need to work closely with your health care team. You may also need to make some lifestyle changes. These may include the following: Eating and drinking  Increase your nutrition. ? Follow your health care provider's recommendations for weight gain. You may need to gain a little extra weight when you are pregnant with multiples. ? Eat healthy snacks often throughout the day. This can add calories and reduce nausea.  Drink enough fluid to keep your urine pale yellow.  Take prenatal vitamins. Activity By 20-24 weeks, you may need to limit your activities.    Avoid activities and work that take a lot of effort (are strenuous).  Ask your health care provider when you should stop having sexual intercourse.  Rest often. General instructions  Do not use any products that  contain nicotine or tobacco, such as cigarettes and e-cigarettes. If you need help quitting, ask your health care provider.  Do not drink alcohol or use illegal drugs.  Take over-the-counter and prescription medicines only as told by your health care provider.  Arrange for extra help around the house.  Keep all follow-up visits and all prenatal visits as told by your health care provider. This is important. Contact a health care provider if:  You have dizziness.  You have persistent nausea, vomiting, or diarrhea.  You are having trouble gaining weight.  You have feelings of depression or other emotions that are interfering with your normal activities. Get help right away if:  You have a fever.  You have pain with urination.  You have fluid leaking from your vagina.  You have a bad-smelling vaginal discharge.  You notice increased swelling in your face, hands, legs, or ankles.  You have spotting or bleeding from your vagina.  You have pelvic cramps, pelvic pressure, or nagging pain in your abdomen or lower back.  You are having regular contractions.  You develop a severe headache, with or without visual changes.  You have shortness of breath or chest pain.  You notice less fetal movement, or no fetal movement. Summary  Having a multiple pregnancy means that a woman is carrying more than one baby at a time.  A multiple pregnancy puts you at a higher risk for certain problems during and after your pregnancy, such as: having your babies delivered before you have reached a full-term pregnancy (preterm birth), diabetes, preeclampsia, excessive blood loss after childbirth (postpartum hemorrhage), postpartum depression, or low birth weight of the babies.  Your health care provider will want to monitor you more closely during your pregnancy to make sure that your babies are growing normally and that you are healthy.  You may need to make some lifestyle changes during  pregnancy, including: increasing your nutrition, limiting your activities after 20-24 weeks of pregnancy, and arranging for extra help around the house. This information is not intended to replace advice given to you by your health care provider. Make sure you discuss any questions you have with your health care provider. Document Released: 10/08/2007 Document Revised: 09/23/2016 Document Reviewed: 08/30/2015 Elsevier Interactive Patient Education  2019 Elsevier Inc.  

## 2018-06-15 NOTE — Telephone Encounter (Signed)
Spoke w/patient. Apologized for the error. Inquired about the patient name & DOB on her u/s pics. Katelyn Mendoza [birth year] is what is listed on her pics from 05/27/2018. Also, she wants to be sure it's her images and she was told she could possibly have twins. She wants to be sure it was her & not the patient whose name is on the pictures. Advised will discuss with Practice Administrator and u/s to verify/reprint her pics with correct information. Patient would like to be rescanned to verify that all images & information is actually hers.

## 2018-06-23 ENCOUNTER — Ambulatory Visit: Payer: Medicaid Other

## 2018-06-25 LAB — NUSWAB VAGINITIS PLUS (VG+)
Candida albicans, NAA: NEGATIVE
Candida glabrata, NAA: NEGATIVE
Chlamydia trachomatis, NAA: NEGATIVE
Neisseria gonorrhoeae, NAA: NEGATIVE
Trich vag by NAA: NEGATIVE

## 2018-06-27 NOTE — Progress Notes (Signed)
Please call patient with normal nuswab result

## 2018-06-28 ENCOUNTER — Telehealth: Payer: Self-pay

## 2018-06-28 NOTE — Telephone Encounter (Signed)
Pt was experiencing some pain on the outside of her vagina still after multiple doses of yeast medication. I advised her that nothing came back on her swabs last time, the only option she would have is to come in to be seen and speak with CRS about her symptoms again or to try more yeast cream however if it is not helping she might just need to come in and be tested for a multitude of things to try and pin point what is going on or if it is something else. It is up to the pt on what she would like to do.

## 2018-06-28 NOTE — Telephone Encounter (Signed)
Pt calling; wants nurse to call her back; she spoke c nurse yesterday about this yeast she has going on.  [phone]

## 2018-06-29 ENCOUNTER — Other Ambulatory Visit: Payer: Self-pay | Admitting: Obstetrics and Gynecology

## 2018-06-29 DIAGNOSIS — B3731 Acute candidiasis of vulva and vagina: Secondary | ICD-10-CM

## 2018-06-29 DIAGNOSIS — B373 Candidiasis of vulva and vagina: Secondary | ICD-10-CM

## 2018-06-29 MED ORDER — FLUCONAZOLE 150 MG PO TABS: 150.0000 mg | ORAL_TABLET | ORAL | 1 refills | Status: AC

## 2018-06-29 MED ORDER — TERCONAZOLE 0.4 % VA CREA: 1.0000 | TOPICAL_CREAM | VAGINAL | 5 refills | Status: DC

## 2018-06-29 NOTE — Progress Notes (Signed)
Called and discussed with patient. Rx sent. She will try oral medication now that she is outside for the first trimester then she will do a weekly vaginal insert to suppress during pregnancy.

## 2018-06-30 ENCOUNTER — Other Ambulatory Visit: Payer: Self-pay

## 2018-06-30 ENCOUNTER — Other Ambulatory Visit: Payer: Self-pay | Admitting: Obstetrics and Gynecology

## 2018-06-30 DIAGNOSIS — O30002 Twin pregnancy, unspecified number of placenta and unspecified number of amniotic sacs, second trimester: Secondary | ICD-10-CM

## 2018-07-04 ENCOUNTER — Other Ambulatory Visit: Payer: Self-pay

## 2018-07-04 ENCOUNTER — Other Ambulatory Visit: Payer: Self-pay | Admitting: Obstetrics and Gynecology

## 2018-07-04 ENCOUNTER — Ambulatory Visit
Admission: RE | Admit: 2018-07-04 | Discharge: 2018-07-04 | Disposition: A | Discharge status: Home / Self Care | Payer: Medicaid Other | Source: Ambulatory Visit | Attending: Obstetrics and Gynecology | Admitting: Obstetrics and Gynecology

## 2018-07-04 ENCOUNTER — Institutional Professional Consult (permissible substitution): Payer: Medicaid Other

## 2018-07-04 ENCOUNTER — Inpatient Hospital Stay
Admission: RE | Admit: 2018-07-04 | Discharge: 2018-07-04 | Disposition: A | Discharge status: Home / Self Care | Payer: Medicaid Other | Source: Ambulatory Visit

## 2018-07-04 VITALS — BP 117/74 | HR 98 | Temp 98.4°F | Resp 18 | Ht 61.0 in | Wt 166.0 lb

## 2018-07-04 DIAGNOSIS — Z8249 Family history of ischemic heart disease and other diseases of the circulatory system: Secondary | ICD-10-CM | POA: Insufficient documentation

## 2018-07-04 DIAGNOSIS — B009 Herpesviral infection, unspecified: Secondary | ICD-10-CM

## 2018-07-04 DIAGNOSIS — Z87440 Personal history of urinary (tract) infections: Secondary | ICD-10-CM | POA: Diagnosis not present

## 2018-07-04 DIAGNOSIS — F319 Bipolar disorder, unspecified: Secondary | ICD-10-CM

## 2018-07-04 DIAGNOSIS — O219 Vomiting of pregnancy, unspecified: Secondary | ICD-10-CM | POA: Insufficient documentation

## 2018-07-04 DIAGNOSIS — O30002 Twin pregnancy, unspecified number of placenta and unspecified number of amniotic sacs, second trimester: Secondary | ICD-10-CM

## 2018-07-04 DIAGNOSIS — Z3A14 14 weeks gestation of pregnancy: Secondary | ICD-10-CM | POA: Insufficient documentation

## 2018-07-04 DIAGNOSIS — O9902 Anemia complicating childbirth: Secondary | ICD-10-CM

## 2018-07-04 DIAGNOSIS — K8020 Calculus of gallbladder without cholecystitis without obstruction: Secondary | ICD-10-CM

## 2018-07-04 DIAGNOSIS — O30039 Twin pregnancy, monochorionic/diamniotic, unspecified trimester: Secondary | ICD-10-CM

## 2018-07-04 DIAGNOSIS — E669 Obesity, unspecified: Secondary | ICD-10-CM | POA: Diagnosis not present

## 2018-07-04 DIAGNOSIS — B3731 Acute candidiasis of vulva and vagina: Secondary | ICD-10-CM

## 2018-07-04 DIAGNOSIS — O99212 Obesity complicating pregnancy, second trimester: Secondary | ICD-10-CM | POA: Insufficient documentation

## 2018-07-04 DIAGNOSIS — B373 Candidiasis of vulva and vagina: Secondary | ICD-10-CM

## 2018-07-04 DIAGNOSIS — Z79899 Other long term (current) drug therapy: Secondary | ICD-10-CM | POA: Diagnosis not present

## 2018-07-04 DIAGNOSIS — O30032 Twin pregnancy, monochorionic/diamniotic, second trimester: Secondary | ICD-10-CM

## 2018-07-04 DIAGNOSIS — O99210 Obesity complicating pregnancy, unspecified trimester: Secondary | ICD-10-CM

## 2018-07-04 DIAGNOSIS — O0991 Supervision of high risk pregnancy, unspecified, first trimester: Secondary | ICD-10-CM

## 2018-07-04 DIAGNOSIS — O99342 Other mental disorders complicating pregnancy, second trimester: Secondary | ICD-10-CM | POA: Diagnosis not present

## 2018-07-04 DIAGNOSIS — D573 Sickle-cell trait: Secondary | ICD-10-CM

## 2018-07-04 DIAGNOSIS — O99012 Anemia complicating pregnancy, second trimester: Secondary | ICD-10-CM | POA: Insufficient documentation

## 2018-07-04 DIAGNOSIS — Z833 Family history of diabetes mellitus: Secondary | ICD-10-CM | POA: Diagnosis not present

## 2018-07-04 DIAGNOSIS — O98512 Other viral diseases complicating pregnancy, second trimester: Secondary | ICD-10-CM | POA: Diagnosis not present

## 2018-07-04 HISTORY — DX: Other reaction to spinal and lumbar puncture: G97.1

## 2018-07-04 HISTORY — DX: Herpesviral infection, unspecified: B00.9

## 2018-07-04 MED ORDER — ONDANSETRON HCL 4 MG PO TABS: 4.0000 mg | ORAL_TABLET | Freq: Every day | ORAL | 1 refills | Status: DC | PRN

## 2018-07-04 MED ORDER — FLUCONAZOLE 100 MG PO TABS: 100.0000 mg | ORAL_TABLET | Freq: Every day | ORAL | 0 refills | Status: AC

## 2018-07-04 MED ORDER — ASPIRIN 81 MG PO CHEW: 81.0000 mg | CHEWABLE_TABLET | Freq: Every day | ORAL | 3 refills | Status: DC

## 2018-07-04 MED ORDER — VALACYCLOVIR HCL 500 MG PO TABS: 500.0000 mg | ORAL_TABLET | Freq: Two times a day (BID) | ORAL | 0 refills | Status: AC

## 2018-07-04 MED ORDER — TERCONAZOLE 0.4 % VA CREA: 1.0000 | TOPICAL_CREAM | Freq: Every day | VAGINAL | 0 refills | Status: DC

## 2018-07-04 MED ORDER — DOCUSATE SODIUM 100 MG PO CAPS: 100.0000 mg | ORAL_CAPSULE | Freq: Two times a day (BID) | ORAL | 2 refills | Status: DC

## 2018-07-04 MED ORDER — TRIAMCINOLONE ACETONIDE 0.5 % EX OINT: 1.0000 "application " | TOPICAL_OINTMENT | Freq: Two times a day (BID) | CUTANEOUS | 0 refills | Status: DC

## 2018-07-04 NOTE — Progress Notes (Signed)
[facility] Consultation   Chief Complaint: Mono Di twin gestation   HPI: [name] is a 28 y.o. G3P1011 AAF in a committed long term relationship at [id number] by LMP 03/27/2018 and known date of conception of 04/10/18 14 w 4d , first u/s on 06/15/2018 at 11w 6d EDD of 12 /17/2020   who [facility] in [facility] for Monochorionic Diamniotic twin gestation. 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 Pt declined genetic testing earlier but considering - father has a brother with Down syndrome and to also discuss sickle cell  Testing - she feels like to have 2 children with issues would be overwhelming  Pt c/o miserable yeast symptoms for the last 30 days not responding to creams or tablets (s/p terazol  /diflucan )  Obesity - bmi 32  Recurrent UTIs last one in early march .   HSV - rare recurrences - likes to have valtrex on hand  Still having N&V threw up this morning - feels like weight is stable but doesn't think diclegis is enough .   Bipolar - pt feels like she gets angry easily and could benefit form assistance , no meds recently     Past Medical History: Patient  has a past medical history of Gallstones, Mental disorder, Sickle cell trait (HCC), and Urinary tract infection affecting care of mother, antepartum.  Past Surgical History: She  has a past surgical history that includes No past surgeries.  Obstetric History:  OB History    Gravida  3   Para  1   Term  1   Preterm  0   AB  1   Living  1     SAB  0   TAB  1   Ectopic  0   Multiple  0   Live Births  1         h/o VAVD at 41 weeks 6 lbs 11 oz with epidural  Gynecologic History:  Patient's last menstrual period was 03/27/2018.   Medications:  Current Outpatient Medications:  .  cephALEXin (KEFLEX) 500 MG capsule, Take 1 capsule (500 mg total) by mouth 3 (three) times daily. (Patient not taking: Reported on 06/15/2018), Disp: 21 capsule, Rfl: 0 .   Doxylamine-Pyridoxine (DICLEGIS) 10-10 MG TBEC, Take 2 tablets by mouth at bedtime. If symptoms persist, add one tablet in the morning and one in the afternoon, Disp: 100 tablet, Rfl: 5 .  metoCLOPramide (REGLAN) 5 MG tablet, Take 1 tablet (5 mg total) by mouth every 8 (eight) hours as needed for nausea or vomiting. (Patient not taking: Reported on 06/15/2018), Disp: 15 tablet, Rfl: 0 .  miconazole (MICONAZOLE 7) 2 % vaginal cream, Place 1 Applicatorful vaginally at bedtime. (Patient not taking: Reported on 06/15/2018), Disp: 45 g, Rfl: 0 .  Prenatal Vit-Fe Fumarate-FA (PRENATAL MULTIVITAMIN) TABS tablet, Take 1 tablet by mouth daily at 12 noon., Disp: , Rfl:  .  terconazole (TERAZOL 7) 0.4 % vaginal cream, Place 1 applicator vaginally at bedtime. (Patient not taking: Reported on 06/15/2018), Disp: 45 g, Rfl: 0 .  terconazole (TERAZOL 7) 0.4 % vaginal cream, Place 1 applicator vaginally once a week., Disp: 45 g, Rfl: 5 .  valACYclovir (VALTREX) 500 MG tablet, Take 500 mg by mouth 2 (two) times daily., Disp: , Rfl:  Allergies: Patient has No Known Allergies.  Social History: Patient  reports that she has never smoked. She has never used smokeless tobacco. She reports that she  does not drink alcohol or use drugs.  Family History: family history includes Diabetes in her mother; Hypertension in her mother.  Review of Systems A full 12 point review of systems was negative or as noted in the History of Present Illness.  Physical Exam: LMP 03/27/2018  61 inches 166 lbs  98.4 117/74 hr 98  RR 18 O2 sat 98 %   U/s done - see report   Pelvic exam  Slight thickening of vulva in area of discomfort , moderate white discharge  Swab sent to [facility]  For Candida species check due to recurrent vulvovaginitis not responding to treatment over the past month    Asessement: 1. Twin pregnancy, monochorionic/diamniotic, unspecified trimester   2. Supervision of high risk pregnancy in first trimester   3. HSV  infection   4. Nausea and vomiting during pregnancy   5. Obesity affecting pregnancy, antepartum   6- sickle trait - thinks partner is negative same as father of her little son  7 recurrent yeast vulvovaginitis - check HIV test , hgb A1c , prolonged course of therapy given with diflucan and terazol sent a speciaiton swab to [facility]   8 h/o bipolar - pt feels like she needs assistance with her mood 9 persistent N&V - desires more meds    I discussed risks of twins - increased risk of preterm birth, increased risk of FGR , increased risk of gest DM and preeclampsia , we reviewed risks of mono di twins including TTTS , need for intensive monitoring and earlier delivery .  Plan: 1. q 2 week TTTS checks , anatomy scan at 18 weeks and growth monthly 16-30 weeks  2 NST /AFI weekly from 32 weeks to delivery at 37 weeks  3- during COVID we have not been performing fetal echo unless unable to visualize cardiac anatomy on anatomy scan  4 - recommended baby aspirin as a preeclampsia prevention 5 genetic counseling for family h/o Down syndrome (partner's brother) and sickle trait scheduled with [facility]  6 refill for valtrex given  7 Rx for zofran now out of first trimester , use with colace  8 urine culture q trimester or for sxs  9 I gave a prolonged diflucan course and terazol , topical triamcinolone for vulvar skin irritation   10 mental health referral requested - we did not set up today - please schedule via [facility] if they have a resource .    Total time spent with the patient was 30 minutes with greater than 50% spent in counseling and coordination of care. We appreciate this interesting consult and will be happy to be involved in the ongoing care of Katelyn Mendoza in anyway her obstetricians desire.  Gatha Mayer MD   [facility]

## 2018-07-07 ENCOUNTER — Other Ambulatory Visit: Payer: Self-pay

## 2018-07-07 ENCOUNTER — Other Ambulatory Visit
Admission: RE | Admit: 2018-07-07 | Discharge: 2018-07-07 | Disposition: A | Discharge status: Home / Self Care | Payer: Medicaid Other | Source: Ambulatory Visit | Attending: Obstetrics and Gynecology | Admitting: Obstetrics and Gynecology

## 2018-07-07 ENCOUNTER — Telehealth: Payer: Self-pay

## 2018-07-07 DIAGNOSIS — O0991 Supervision of high risk pregnancy, unspecified, first trimester: Secondary | ICD-10-CM | POA: Insufficient documentation

## 2018-07-07 DIAGNOSIS — Z3A00 Weeks of gestation of pregnancy not specified: Secondary | ICD-10-CM | POA: Insufficient documentation

## 2018-07-07 NOTE — Telephone Encounter (Signed)
Diannia Ruder, RN w/[facility] reports patient was seen today & complained of recurrent yeast infections for over a month. She has been treated w/oral & creams & it hasn't resolved. Dr. Lehman Prom did a yeast swab & the lab called stating it wasn't in the correct tube. Loma Sousa advising as swab may need to be repeated at her visit w/[facility] Friday 07/08/2018.

## 2018-07-08 ENCOUNTER — Ambulatory Visit (INDEPENDENT_AMBULATORY_CARE_PROVIDER_SITE_OTHER): Payer: Medicaid Other | Admitting: Obstetrics and Gynecology

## 2018-07-08 ENCOUNTER — Other Ambulatory Visit: Payer: Medicaid Other

## 2018-07-08 ENCOUNTER — Encounter: Payer: Self-pay | Admitting: Obstetrics and Gynecology

## 2018-07-08 ENCOUNTER — Other Ambulatory Visit: Payer: Self-pay

## 2018-07-08 ENCOUNTER — Encounter: Payer: Medicaid Other | Admitting: Maternal Newborn

## 2018-07-08 VITALS — BP 114/80 | Wt 168.0 lb

## 2018-07-08 DIAGNOSIS — Z6830 Body mass index (BMI) 30.0-30.9, adult: Secondary | ICD-10-CM

## 2018-07-08 DIAGNOSIS — D573 Sickle-cell trait: Secondary | ICD-10-CM

## 2018-07-08 DIAGNOSIS — B009 Herpesviral infection, unspecified: Secondary | ICD-10-CM

## 2018-07-08 DIAGNOSIS — O219 Vomiting of pregnancy, unspecified: Secondary | ICD-10-CM

## 2018-07-08 DIAGNOSIS — Z3A14 14 weeks gestation of pregnancy: Secondary | ICD-10-CM

## 2018-07-08 DIAGNOSIS — O30039 Twin pregnancy, monochorionic/diamniotic, unspecified trimester: Secondary | ICD-10-CM

## 2018-07-08 DIAGNOSIS — O30032 Twin pregnancy, monochorionic/diamniotic, second trimester: Secondary | ICD-10-CM

## 2018-07-08 DIAGNOSIS — O0990 Supervision of high risk pregnancy, unspecified, unspecified trimester: Secondary | ICD-10-CM

## 2018-07-08 DIAGNOSIS — O0992 Supervision of high risk pregnancy, unspecified, second trimester: Secondary | ICD-10-CM

## 2018-07-08 DIAGNOSIS — O99212 Obesity complicating pregnancy, second trimester: Secondary | ICD-10-CM

## 2018-07-08 DIAGNOSIS — Z3490 Encounter for supervision of normal pregnancy, unspecified, unspecified trimester: Secondary | ICD-10-CM

## 2018-07-08 DIAGNOSIS — O9902 Anemia complicating childbirth: Secondary | ICD-10-CM

## 2018-07-08 NOTE — Progress Notes (Signed)
Routine Prenatal Care Visit  Subjective  ILEE Mendoza is a 28 y.o. G3P1011 at [redacted]w[redacted]d being seen today for ongoing prenatal care.  She is currently monitored for the following issues for this high-risk pregnancy and has Supervision of high-risk pregnancy; HSV infection; Nausea and vomiting during pregnancy; Twin pregnancy, monochorionic/diamniotic, unspecified trimester; Obesity affecting pregnancy; Sickle cell trait in mother affecting childbirth [facility]); Bipolar I disorder ([place]); Gallstones; Vulvovaginitis due to yeast; and BMI 30.0-30.9,adult on their problem list.  ----------------------------------------------------------------------------------- Patient reports continued vaginitis symptoms. Rx given by [facility] 4 days ago.  She was informed that the swab collected was not able to be processed. She declines a repeat collection today.   Contractions: Not present. Vag. Bleeding: None.  Movement: Absent. Denies leaking of fluid.  ----------------------------------------------------------------------------------- The following portions of the patient's history were reviewed and updated as appropriate: allergies, current medications, past family history, past medical history, past social history, past surgical history and problem list. Problem list updated.  Objective  Blood pressure 114/80, weight 168 lb (76.2 kg), last menstrual period 03/27/2018, unknown if currently breastfeeding. Pregravid weight 160 lb (72.6 kg) Total Weight Gain 8 lb (3.629 kg) Urinalysis: Urine Protein    Urine Glucose    Fetal Status: Fetal Heart Rate (bpm): 159/161   Movement: Absent     BSUS:   baby A-maternal right - FHR 161  Baby B- maternal left - FHR 159   General:  Alert, oriented and cooperative. Patient is in no acute distress.  Skin: Skin is warm and dry. No rash noted.   Cardiovascular: Normal heart rate noted  Respiratory: Normal respiratory effort, no problems with respiration noted  Abdomen: Soft,  gravid, appropriate for gestational age. Pain/Pressure: Present     Pelvic:  Cervical exam deferred        Extremities: Normal range of motion.     Mental Status: Normal mood and affect. Normal behavior. Normal judgment and thought content.   Assessment   28 y.o. G3P1011 at [redacted]w[redacted]d by  01/01/2019, by Last Menstrual Period presenting for routine prenatal visit  Plan   pregnancy3 Problems (from 03/27/18 to present)    Problem Noted Resolved   BMI 30.0-30.9,adult 07/08/2018 by Will Bonnet, MD No   Twin pregnancy, monochorionic/diamniotic, unspecified trimester 05/27/2018 by Rod Can, CNM No   Overview Signed 06/15/2018  9:15 PM by Homero Fellers, MD    Monochorionic Diamniotic Twin Pregnancy Plan  Mom should take 60-120mg  of elemental iron a day as well as 1mg  of folic acid  02-72 wks: Evaluate for early growth discordance between  16 wks: Begin Ultrasounds for TTTS checks every 2 weeks (monitor MVP* for each twin and visualize the bladder) 18-20 wks:  Anatomy Ultrasound 22 wks: Fetal Echo 20-Delivery: Growth Ultrasounds every 2-4 weeks** 32 wks: Initiate NSTs  Delivery No complications: 34 0/7 - 37 6/7 Isolated IUGR: 32 0/7- 34 6/7  Genetic screening offered with first trimester screen (11-14 wks) or [facility] sampling (10-12 wks)  Risks: TTTS complicates up to 53-66% of monochorionic pregnancies TAPS complicates 4-4% of monochorionic pregnancies TRAP complicates 1% of monochorionic pregnancies A fetal demise is one twin results in a 40-50% risk of death of neurologic handicap in the surviving twin There is a ninefold increased risk of congenital heart defects, 4-5%. Normal risk in 0.5%. Perinatal loss and handicap rate is 3-5 times higher than a dichorionic pregnancy  *MVP for either twin > 8cm or < 2cm should result in MFM referral Weekly [place] if issues  with MVP arise  **IUGR or Growth discordance > 20% should prompt MFM consultation CALCULATOR:  [email or web address]       Obesity affecting pregnancy 05/27/2018 by Tresea MallGledhill, Jane, CNM No   Supervision of high-risk pregnancy 05/10/2018 by Nadara MustardHarris, Robert P, MD No   Overview Addendum 06/15/2018  9:19 PM by Natale MilchSchuman, Christanna R, MD    Clinic [facility] Prenatal Labs  Dating  LMP = 11 wk US Blood type:     Genetic Screen Declines  Antibody:   Anatomic US  Rubella:   Varicella: @VZVIGG @  GTT Early:               Third trimester:  RPR:     Rhogam  HBsAg:     TDaP vaccine                       Flu Shot: HIV:     Baby Food                                GBS:   Contraception  Pap: no record of recent pap  Need Valtrex suppression   CS/VBAC N/a   Support Person                Preterm labor symptoms and general obstetric precautions including but not limited to vaginal bleeding, contractions, leaking of fluid and fetal movement were reviewed in detail with the patient. Please refer to After Visit Summary for other counseling recommendations.   - Labs today, 1h gtt - re-inforced recs from [facility]  Return in about 2 weeks (around 07/22/2018) for Routine Prenatal Appointment.  Thomasene MohairStephen Sou Nohr, MD, Merlinda FrederickFACOG [facility], [facility] 07/08/2018 4:36 PM

## 2018-07-11 LAB — MISC LABCORP TEST (SEND OUT): Labcorp test code: 182493

## 2018-07-11 LAB — HEMOGLOBINOPATHY EVALUATION
HGB C: 0 %
HGB S: 38.7 % — ABNORMAL HIGH
HGB VARIANT: 0 %
Hemoglobin A2 Quantitation: 3.6 % — ABNORMAL HIGH (ref 1.8–3.2)
Hemoglobin F Quantitation: 0 % (ref 0.0–2.0)
Hgb A: 57.7 % — ABNORMAL LOW (ref 96.4–98.8)

## 2018-07-11 LAB — RPR+RH+ABO+RUB AB+AB SCR+CB...
Antibody Screen: NEGATIVE
HIV Screen 4th Generation wRfx: NONREACTIVE
Hematocrit: 35.6 % (ref 34.0–46.6)
Hemoglobin: 11.8 g/dL (ref 11.1–15.9)
Hepatitis B Surface Ag: NEGATIVE
MCH: 26.9 pg (ref 26.6–33.0)
MCHC: 33.1 g/dL (ref 31.5–35.7)
MCV: 81 fL (ref 79–97)
Platelets: 346 10*3/uL (ref 150–450)
RBC: 4.38 x10E6/uL (ref 3.77–5.28)
RDW: 13.6 % (ref 11.7–15.4)
RPR Ser Ql: NONREACTIVE
Rh Factor: POSITIVE
Rubella Antibodies, IGG: 3.27 index (ref >0.99)
Varicella zoster IgG: 872 index (ref >165)
WBC: 9.2 10*3/uL (ref 3.4–10.8)

## 2018-07-11 LAB — GLUCOSE, 1 HOUR GESTATIONAL: Gestational Diabetes Screen: 187 mg/dL — ABNORMAL HIGH (ref 65–139)

## 2018-07-12 ENCOUNTER — Other Ambulatory Visit: Payer: Self-pay | Admitting: Obstetrics & Gynecology

## 2018-07-12 DIAGNOSIS — O99810 Abnormal glucose complicating pregnancy: Secondary | ICD-10-CM

## 2018-07-12 NOTE — Progress Notes (Signed)
Schedule 3 hour glucola soon plz

## 2018-07-14 ENCOUNTER — Other Ambulatory Visit: Payer: Self-pay

## 2018-07-14 DIAGNOSIS — O30039 Twin pregnancy, monochorionic/diamniotic, unspecified trimester: Secondary | ICD-10-CM

## 2018-07-18 ENCOUNTER — Ambulatory Visit: Admission: RE | Admit: 2018-07-18 | Payer: Medicaid Other | Source: Ambulatory Visit

## 2018-07-18 ENCOUNTER — Other Ambulatory Visit: Payer: Medicaid Other

## 2018-07-18 ENCOUNTER — Other Ambulatory Visit: Payer: Self-pay

## 2018-07-18 ENCOUNTER — Ambulatory Visit
Admission: RE | Admit: 2018-07-18 | Discharge: 2018-07-18 | Disposition: A | Discharge status: Home / Self Care | Payer: Medicaid Other | Source: Ambulatory Visit | Attending: Obstetrics and Gynecology | Admitting: Obstetrics and Gynecology

## 2018-07-18 DIAGNOSIS — O30032 Twin pregnancy, monochorionic/diamniotic, second trimester: Secondary | ICD-10-CM | POA: Insufficient documentation

## 2018-07-18 DIAGNOSIS — Z3A16 16 weeks gestation of pregnancy: Secondary | ICD-10-CM | POA: Diagnosis not present

## 2018-07-18 DIAGNOSIS — O30039 Twin pregnancy, monochorionic/diamniotic, unspecified trimester: Secondary | ICD-10-CM

## 2018-07-21 ENCOUNTER — Ambulatory Visit
Admission: RE | Admit: 2018-07-21 | Discharge: 2018-07-21 | Disposition: A | Discharge status: Home / Self Care | Payer: Medicaid Other | Source: Ambulatory Visit | Attending: Maternal & Fetal Medicine | Admitting: Maternal & Fetal Medicine

## 2018-07-21 ENCOUNTER — Emergency Department
Admission: EM | Admit: 2018-07-21 | Discharge: 2018-07-21 | Disposition: A | Discharge status: Home / Self Care | Payer: Medicaid Other | Attending: Emergency Medicine | Admitting: Emergency Medicine

## 2018-07-21 ENCOUNTER — Other Ambulatory Visit: Payer: Self-pay

## 2018-07-21 DIAGNOSIS — Z5321 Procedure and treatment not carried out due to patient leaving prior to being seen by health care provider: Secondary | ICD-10-CM | POA: Diagnosis not present

## 2018-07-21 DIAGNOSIS — Z8279 Family history of other congenital malformations, deformations and chromosomal abnormalities: Secondary | ICD-10-CM

## 2018-07-21 DIAGNOSIS — Z3A17 17 weeks gestation of pregnancy: Secondary | ICD-10-CM | POA: Diagnosis not present

## 2018-07-21 DIAGNOSIS — O99019 Anemia complicating pregnancy, unspecified trimester: Secondary | ICD-10-CM | POA: Insufficient documentation

## 2018-07-21 DIAGNOSIS — O26892 Other specified pregnancy related conditions, second trimester: Secondary | ICD-10-CM | POA: Diagnosis present

## 2018-07-21 DIAGNOSIS — D573 Sickle-cell trait: Secondary | ICD-10-CM | POA: Diagnosis not present

## 2018-07-21 LAB — COMPREHENSIVE METABOLIC PANEL
ALT: 13 U/L (ref 0–44)
AST: 17 U/L (ref 15–41)
Albumin: 2.9 g/dL — ABNORMAL LOW (ref 3.5–5.0)
Alkaline Phosphatase: 53 U/L (ref 38–126)
Anion gap: 8 (ref 5–15)
BUN: 6 mg/dL (ref 6–20)
CO2: 22 mmol/L (ref 22–32)
Calcium: 8.5 mg/dL — ABNORMAL LOW (ref 8.9–10.3)
Chloride: 105 mmol/L (ref 98–111)
Creatinine, Ser: 0.55 mg/dL (ref 0.44–1.00)
GFR calc Af Amer: >60 mL/min (ref >60)
GFR calc non Af Amer: >60 mL/min (ref >60)
Glucose, Bld: 119 mg/dL — ABNORMAL HIGH (ref 70–99)
Potassium: 3 mmol/L — ABNORMAL LOW (ref 3.5–5.1)
Sodium: 135 mmol/L (ref 135–145)
Total Bilirubin: 0.4 mg/dL (ref 0.3–1.2)
Total Protein: 6 g/dL — ABNORMAL LOW (ref 6.5–8.1)

## 2018-07-21 LAB — CBC WITH DIFFERENTIAL/PLATELET
Abs Immature Granulocytes: 0.06 10*3/uL (ref 0.00–0.07)
Basophils Absolute: 0 10*3/uL (ref 0.0–0.1)
Basophils Relative: 0 %
Eosinophils Absolute: 0.1 10*3/uL (ref 0.0–0.5)
Eosinophils Relative: 1 %
HCT: 30.2 % — ABNORMAL LOW (ref 36.0–46.0)
Hemoglobin: 10.4 g/dL — ABNORMAL LOW (ref 12.0–15.0)
Immature Granulocytes: 1 %
Lymphocytes Relative: 18 %
Lymphs Abs: 1.5 10*3/uL (ref 0.7–4.0)
MCH: 27.3 pg (ref 26.0–34.0)
MCHC: 34.4 g/dL (ref 30.0–36.0)
MCV: 79.3 fL — ABNORMAL LOW (ref 80.0–100.0)
Monocytes Absolute: 0.5 10*3/uL (ref 0.1–1.0)
Monocytes Relative: 5 %
Neutro Abs: 6.4 10*3/uL (ref 1.7–7.7)
Neutrophils Relative %: 75 %
Platelets: 286 10*3/uL (ref 150–400)
RBC: 3.81 MIL/uL — ABNORMAL LOW (ref 3.87–5.11)
RDW: 14.1 % (ref 11.5–15.5)
WBC: 8.5 10*3/uL (ref 4.0–10.5)
nRBC: 0 % (ref 0.0–0.2)

## 2018-07-21 NOTE — Progress Notes (Signed)
Virtual Visit via Telephone Note  I connected with Katelyn Mendoza on @TODAY @ at  9:30 AM EDT by telephone and verified that I am speaking with the correct person using two identifiers.  Referring Provider:   [facility] Length of Consultation: 35 minutes  K[facility]endo[facility]  for genetic counseling to discuss her history of sickle cell trait, family history of Down syndrome and prenatal testing options.  This note is a summary of our discussion.  We first obtained a detailed family history. Katelyn Mendoza reported that her partner's brother has Down syndrome. He is currently in his 30s and lives in a group home.  There is no other family history of children with birth defects, developmental delays, multiple miscarriages or chromosome conditions.  She also denied any complications during this pregnancy or exposures to medications, recreational drugs, tobacco or alcohol. Katelyn Mendoza is known to have sickle cell trait, which she stated was inherited from her father.  Her paternal siblings also have the trait and one of her sister has a daughter with sickle cell disease.  She expressed a prior understanding of the recessive inheritance of sickle cell and other hemoglobinopathies and stated that she is confident that Katelyn Mendoza is not a carrier.  We do not have documentation of testing on Justin, and offered carrier testing on him for hemoglobinopathies.  Katelyn Mendoza declined and is comfortable waiting for newborn screening to test this pregnancy.    Regarding the family history of Down syndrome, we discussed that chromosomes are the inherited structures that contain our instructions for development (genes).  Each cell of our body normally has 46 chromosomes, matched up into 23 pairs.  The last pair determines our gender and are called the sex chromosomes.  A female has an X and a Y chromosome, while a female has two X chromosomes.  Rarely, when a mother's egg and father's  sperm unite, an extra or missing chromosome can be passed on to the baby by mistake.  Changes in the number or the structure of the chromosomes may result in a child with some degree of mental retardation and physical problems.  Down syndrome is caused by having three copies (instead of the usual two copies) of the genes on chromosome number 21.  There are two types of Down syndrome.  Most often (about 95% of the time), Down syndrome is caused by an entire third copy of chromosome 21, known as Trisomy 8521.  In the other cases, Down syndrome is caused by a rearrangement of the chromosomes, known as a translocation.    Because we do not have documentation of the type of Down syndrome, we discussed the recurrence of both types.  If it is the more common type, it is thought to be a sporadic event that would not be likely to happen again in the family and should not increase the chance for Katelyn Mendoza to have a child with Down syndrome.  If Justin's brother has the translocation type of Down syndrome, the recurrence risk may be higher depending upon which, if any, family members may be translocation carriers.   We discussed the following prenatal screening and testing options for this pregnancy:  Cell free fetal DNA testing from maternal blood is available to determine whether or not the baby may have Down syndrome, trisomy 6913, or trisomy 9418.  This test utilizes a maternal blood sample and DNA sequencing technology to isolate circulating cell free fetal DNA from maternal plasma.  The fetal DNA can then be analyzed for DNA sequences that are derived from the three most common chromosomes involved in aneuploidy, chromosomes 13, 18, and 21.  If the overall amount of DNA is greater than the expected level for any of these chromosomes, aneuploidy is suspected.  While we do not consider it a replacement for invasive testing and karyotype analysis, a negative result from this testing would be reassuring, though not a  guarantee of a normal chromosome complement for the baby.  An abnormal result is certainly suggestive of an abnormal chromosome complement, though we would still recommend CVS or amniocentesis to confirm any findings from this testing.  Maternal serum marker screening, a blood test that measures pregnancy proteins, can provide risk assessments for Down syndrome, trisomy 18, and open neural tube defects (spina bifida, anencephaly). Because it does not directly examine the fetus, it cannot positively diagnose or rule out these problems.  It is able to detect approximately 75% of babies with Down syndrome, 60% of babies with trisomy 60, and 80% of babies with spina bifida.  Targeted ultrasound uses high frequency sound waves to create an image of the developing fetus.  An ultrasound is often recommended as a routine means of evaluating the pregnancy.  It is also used to screen for fetal anatomy problems (for example, a heart defect) that might be suggestive of a chromosomal or other abnormality.   Amniocentesis involves the removal of a small amount of amniotic fluid from the sac surrounding the fetus ("bag of water") with the use of a thin needle inserted through the mother's abdomen and uterus.  Ultrasound guidance is used throughout the procedure.  Fetal cells from amniotic fluid are directly evaluated and > 99.5% of chromosome problems and > 98% of open neural tube defects can be detected. This procedure is generally performed after the 15th week of pregnancy.  The main risks to this procedure include complications leading to miscarriage in less than 1 in 200 cases (0.5%).  Cystic Fibrosis and Spinal Muscular Atrophy (SMA) screening were also discussed with the patient. Both conditions are recessive, which means that both parents must be carriers in order to have a child with the disease.  Cystic fibrosis (CF) is one of the most common genetic conditions in persons of Caucasian ancestry.  This condition  occurs in approximately 1 in 2,500 Caucasian persons and results in thickened secretions in the lungs, digestive, and reproductive systems.  For a baby to be at risk for having CF, both of the parents must be carriers for this condition.  Approximately 1 in 47 Caucasian persons is a carrier for CF.  Current carrier testing looks for the most common mutations in the gene for CF and can detect approximately 90% of carriers in the Caucasian population.  This means that the carrier screening can greatly reduce, but cannot eliminate, the chance for an individual to have a child with CF.  If an individual is found to be a carrier for CF, then carrier testing would be available for the partner. As part of [place] newborn screening profile, all babies born in the state of [place] will have a two-tier screening process.  Specimens are first tested to determine the concentration of immunoreactive trypsinogen (IRT).  The top 5% of specimens with the highest IRT values then undergo DNA testing using a panel of over 40 common CF mutations. SMA is a neurodegenerative disorder that leads to atrophy of skeletal muscle and overall weakness.  This condition  is also more prevalent in the Caucasian population, with 1 in 40-1 in 60 persons being a carrier and 1 in 6,000-1 in 10,000 children being affected.  There are multiple forms of the disease, with some causing death in infancy to other forms with survival into adulthood.  The genetics of SMA is complex, but carrier screening can detect up to 95% of carriers in the Caucasian population.  Similar to CF, a negative result can greatly reduce, but cannot eliminate, the chance to have a child with SMA.  We discussed the prenatal options in detail and after consideration, the patient elected to decline all screening and testing options other than ultrasound. She also declined carrier screening for hemoglobinopathies for her partner, Katelyn Mendoza.  We appreciate very much the  opportunity to be involved with the care of this patient.  If the patient has further questions or concerns, please do not hesitate to call us at [phone].  Cherly Andersoneborah F. Jizelle Conkey, MS, CGC  I provided 35 minutes of non-face-to-face time during this encounter.   Katrina StackWells, Dejan Angert

## 2018-07-21 NOTE — ED Notes (Signed)
Called times 3 at 1720 no answer

## 2018-07-21 NOTE — ED Triage Notes (Signed)
Pt reports headaches off an on for the last week - pt reports that she has had increased headaches during pregnancy (she is [redacted] weeks pregnant) - denies N/V or photophobia

## 2018-07-22 ENCOUNTER — Telehealth: Payer: Self-pay | Admitting: Emergency Medicine

## 2018-07-22 ENCOUNTER — Ambulatory Visit (INDEPENDENT_AMBULATORY_CARE_PROVIDER_SITE_OTHER): Payer: Medicaid Other | Admitting: Obstetrics and Gynecology

## 2018-07-22 ENCOUNTER — Other Ambulatory Visit (HOSPITAL_COMMUNITY)
Admission: RE | Admit: 2018-07-22 | Discharge: 2018-07-22 | Disposition: A | Discharge status: Home / Self Care | Payer: Medicaid Other | Source: Ambulatory Visit | Attending: Obstetrics and Gynecology | Admitting: Obstetrics and Gynecology

## 2018-07-22 ENCOUNTER — Encounter: Payer: Self-pay | Admitting: Obstetrics and Gynecology

## 2018-07-22 VITALS — BP 100/54 | Wt 173.0 lb

## 2018-07-22 DIAGNOSIS — O9902 Anemia complicating childbirth: Secondary | ICD-10-CM

## 2018-07-22 DIAGNOSIS — Z124 Encounter for screening for malignant neoplasm of cervix: Secondary | ICD-10-CM | POA: Insufficient documentation

## 2018-07-22 DIAGNOSIS — R7309 Other abnormal glucose: Secondary | ICD-10-CM

## 2018-07-22 DIAGNOSIS — G43109 Migraine with aura, not intractable, without status migrainosus: Secondary | ICD-10-CM

## 2018-07-22 DIAGNOSIS — D573 Sickle-cell trait: Secondary | ICD-10-CM

## 2018-07-22 DIAGNOSIS — D508 Other iron deficiency anemias: Secondary | ICD-10-CM

## 2018-07-22 DIAGNOSIS — O0992 Supervision of high risk pregnancy, unspecified, second trimester: Secondary | ICD-10-CM

## 2018-07-22 DIAGNOSIS — B373 Candidiasis of vulva and vagina: Secondary | ICD-10-CM

## 2018-07-22 DIAGNOSIS — Z3A16 16 weeks gestation of pregnancy: Secondary | ICD-10-CM

## 2018-07-22 DIAGNOSIS — O0993 Supervision of high risk pregnancy, unspecified, third trimester: Secondary | ICD-10-CM

## 2018-07-22 DIAGNOSIS — O24419 Gestational diabetes mellitus in pregnancy, unspecified control: Secondary | ICD-10-CM

## 2018-07-22 DIAGNOSIS — B3731 Acute candidiasis of vulva and vagina: Secondary | ICD-10-CM

## 2018-07-22 DIAGNOSIS — O99012 Anemia complicating pregnancy, second trimester: Secondary | ICD-10-CM

## 2018-07-22 DIAGNOSIS — O30039 Twin pregnancy, monochorionic/diamniotic, unspecified trimester: Secondary | ICD-10-CM

## 2018-07-22 DIAGNOSIS — E876 Hypokalemia: Secondary | ICD-10-CM

## 2018-07-22 DIAGNOSIS — O30032 Twin pregnancy, monochorionic/diamniotic, second trimester: Secondary | ICD-10-CM

## 2018-07-22 LAB — POCT URINALYSIS DIPSTICK OB: Glucose, UA: NEGATIVE

## 2018-07-22 MED ORDER — BUTALBITAL-APAP-CAFFEINE 50-325-40 MG PO CAPS: 1.0000 | ORAL_CAPSULE | Freq: Four times a day (QID) | ORAL | 3 refills | Status: DC | PRN

## 2018-07-22 MED ORDER — ACCU-CHEK SMARTVIEW VI STRP: ORAL_STRIP | 12 refills | Status: DC

## 2018-07-22 MED ORDER — TERCONAZOLE 0.4 % VA CREA: 1.0000 | TOPICAL_CREAM | VAGINAL | 3 refills | Status: DC

## 2018-07-22 MED ORDER — POTASSIUM CHLORIDE ER 10 MEQ PO TBCR: 10.0000 meq | EXTENDED_RELEASE_TABLET | Freq: Two times a day (BID) | ORAL | 0 refills | Status: DC

## 2018-07-22 MED ORDER — FERROUS SULFATE 325 (65 FE) MG PO TABS: 325.0000 mg | ORAL_TABLET | Freq: Two times a day (BID) | ORAL | 11 refills | Status: DC

## 2018-07-22 MED ORDER — ACCU-CHEK NANO SMARTVIEW W/DEVICE KIT: 1.0000 | PACK | 0 refills | Status: DC

## 2018-07-22 MED ORDER — FLUCONAZOLE 150 MG PO TABS: 150.0000 mg | ORAL_TABLET | ORAL | 0 refills | Status: AC

## 2018-07-22 MED ORDER — ACCU-CHEK FASTCLIX LANCETS MISC: 1.0000 [IU] | Freq: Four times a day (QID) | 12 refills | Status: DC

## 2018-07-22 NOTE — Progress Notes (Signed)
ROB C/o severe headaches, dizziness, and fatigue

## 2018-07-22 NOTE — Telephone Encounter (Signed)
Called patient due to lwot to inquire about condition and follow up plans.  She says she still has a headache.  She has appt with her doctor at 4 today and will tell them about labs in chl.

## 2018-07-25 NOTE — Progress Notes (Signed)
Routine Prenatal Care Visit  Subjective  Katelyn Mendoza is a 28 y.o. G3P1011 at [redacted]w[redacted]d being seen today for ongoing prenatal care.  She is currently monitored for the following issues for this high-risk pregnancy and has Supervision of high-risk pregnancy; HSV infection; Nausea and vomiting during pregnancy; Twin pregnancy, monochorionic/diamniotic, unspecified trimester; Obesity affecting pregnancy; Sickle cell trait in mother affecting childbirth [facility]); Bipolar I disorder ([place]); Gallstones; Vulvovaginitis due to yeast; BMI 30.0-30.9,adult; Family history of Down syndrome; and Sickle cell trait in mother affecting pregnancy ([place]) on their problem list.  ----------------------------------------------------------------------------------- Patient reports no complaints.   Contractions: Not present. Vag. Bleeding: None.  Movement: Absent. Denies leaking of fluid.  ----------------------------------------------------------------------------------- The following portions of the patient's history were reviewed and updated as appropriate: allergies, current medications, past family history, past medical history, past social history, past surgical history and problem list. Problem list updated.   Objective  Blood pressure (!) 100/54, weight 173 lb (78.5 kg), last menstrual period 03/27/2018, unknown if currently breastfeeding. Pregravid weight 160 lb (72.6 kg) Total Weight Gain 13 lb (5.897 kg) Urinalysis:      Fetal Status: Fetal Heart Rate (bpm): present   Movement: Absent     General:  Alert, oriented and cooperative. Patient is in no acute distress.  Skin: Skin is warm and dry. No rash noted.   Cardiovascular: Normal heart rate noted  Respiratory: Normal respiratory effort, no problems with respiration noted  Abdomen: Soft, gravid, appropriate for gestational age. Pain/Pressure: Absent     Pelvic:  Cervical exam deferred        Extremities: Normal range of motion.  Edema: None  Mental  Status: Normal mood and affect. Normal behavior. Normal judgment and thought content.     Assessment   28 y.o. G3P1011 at [redacted]w[redacted]d by  01/01/2019, by Last Menstrual Period presenting for routine prenatal visit  Plan   pregnancy3 Problems (from 03/27/18 to present)    Problem Noted Resolved   BMI 30.0-30.9,adult 07/08/2018 by Will Bonnet, MD No   Twin pregnancy, monochorionic/diamniotic, unspecified trimester 05/27/2018 by Rod Can, CNM No   Overview Signed 06/15/2018  9:15 PM by Homero Fellers, MD    Monochorionic Diamniotic Twin Pregnancy Plan  Mom should take 60-120mg  of elemental iron a day as well as 1mg  of folic acid  56-43 wks: Evaluate for early growth discordance between  16 wks: Begin Ultrasounds for TTTS checks every 2 weeks (monitor MVP* for each twin and visualize the bladder) 18-20 wks:  Anatomy Ultrasound 22 wks: Fetal Echo 20-Delivery: Growth Ultrasounds every 2-4 weeks** 32 wks: Initiate NSTs  Delivery No complications: 34 0/7 - 37 6/7 Isolated IUGR: 32 0/7- 34 6/7  Genetic screening offered with first trimester screen (11-14 wks) or CVS sampling (10-12 wks)  Risks: TTTS complicates up to 32-95% of monochorionic pregnancies TAPS complicates 1-8% of monochorionic pregnancies TRAP complicates 1% of monochorionic pregnancies A fetal demise is one twin results in a 40-50% risk of death of neurologic handicap in the surviving twin There is a ninefold increased risk of congenital heart defects, 4-5%. Normal risk in 0.5%. Perinatal loss and handicap rate is 3-5 times higher than a dichorionic pregnancy  *MVP for either twin > 8cm or < 2cm should result in MFM referral Weekly [place] if issues with MVP arise  **IUGR or Growth discordance > 20% should prompt MFM consultation CALCULATOR: http://perinatology.com/calculators/Twin%20Discordance.htm       Obesity affecting pregnancy 05/27/2018 by Rod Can, CNM No   Supervision  of high-risk pregnancy  05/10/2018 by Nadara MustardHarris, Robert P, MD No   Overview Addendum 06/15/2018  9:19 PM by Natale MilchSchuman, Christanna R, MD    [facility]  Dating  LMP = 11 wk US Blood type:     Genetic Screen Declines  Antibody:   Anatomic US  Rubella:   Varicella: @VZVIGG @  GTT Early:               Third trimester:  RPR:     Rhogam  HBsAg:     TDaP vaccine                       Flu Shot: HIV:     Baby Food                                GBS:   Contraception  Pap: no record of recent pap  Need Valtrex suppression   CS/VBAC N/a   Support Person                Gestational age appropriate obstetric precautions including but not limited to vaginal bleeding, contractions, leaking of fluid and fetal movement were reviewed in detail with the patient.     Patient was not able to complete a 3 hour glucose test this week because of nausea. Discussed repeating test versus monitoring blood glucose levels for 1 week.  She would like to monitor blood glucose levels. She will return next week with glucometer and lancets/test strips to learn how to monitor blood sugars.   She has not been feeling well and has a constant migraine. She was in the ER yesterday. She had labs done, but she did not stay to be seen. She had low potassium at that time. Will send rx for fiorcet and kdur to help replace potassium. Encouraged hydration.   She has a reoccurrence of a vaginal yeast infection. She has had multiple treatments for this this pregnancy. She will take diflucan and then begin weekly terazol treatments. Discussed how this may be an indicator that she has diabetes and that if her blood sugars are controlled the yeast infections would be less likely.   Referral to diabetic teaching today. Will need hgba1c at next visit (lab closed at end of appointment).   Discussed 81mg  ASA initiation and recommended this to the patient.  Pap and nuswab sent.   Return in about 4 days (around 07/26/2018) for ROB with MD.  Natale Milchhristanna  R Schuman MD [facility], [facility] 07/25/2018, 9:55 AM

## 2018-07-26 ENCOUNTER — Other Ambulatory Visit: Payer: Self-pay | Admitting: Obstetrics and Gynecology

## 2018-07-26 DIAGNOSIS — O30032 Twin pregnancy, monochorionic/diamniotic, second trimester: Secondary | ICD-10-CM

## 2018-07-28 LAB — CYTOLOGY - PAP
Adequacy: ABSENT
Diagnosis: NEGATIVE

## 2018-07-29 ENCOUNTER — Telehealth: Payer: Self-pay

## 2018-07-29 NOTE — Progress Notes (Signed)
WNL, called patient she did not answer. Left message- patient did not come for a visit this week as requested.

## 2018-07-29 NOTE — Telephone Encounter (Signed)
Pt calling; can she come in for u/s next Thrus?  D/t her work schedule she is unable to come in on Mon.  Also has questions about sugar test - retest or log sugars; has lost log. [phone]

## 2018-07-29 NOTE — Telephone Encounter (Signed)
Called pt and explained that we cannot do an U/S any sooner then [facility] can since she is being seen for them for the Twin pregnancy. I have advised pt that she needs to either see CRS or have a telephone visit with her to go over how to use the glucose monitor and how to properly check her levels. Pt understood and wanted to know if there was a way for her to know the sex of the babies any sooner and I have told the pt no because she did not have genetic testing done towards the beginning of her pregnancy and to do it now would be pointless sinse her ultrasound would be sooner then having genetic testing done to find out the sex of the babies. Pt understood and made an appt for 08/04/18 on the phone with CRS

## 2018-08-01 ENCOUNTER — Inpatient Hospital Stay: Admission: RE | Admit: 2018-08-01 | Payer: Medicaid Other | Source: Ambulatory Visit

## 2018-08-01 ENCOUNTER — Other Ambulatory Visit: Payer: Self-pay | Admitting: Maternal & Fetal Medicine

## 2018-08-01 ENCOUNTER — Ambulatory Visit
Admission: RE | Admit: 2018-08-01 | Discharge: 2018-08-01 | Disposition: A | Discharge status: Home / Self Care | Payer: Medicaid Other | Source: Ambulatory Visit | Attending: Maternal & Fetal Medicine | Admitting: Maternal & Fetal Medicine

## 2018-08-01 ENCOUNTER — Other Ambulatory Visit: Payer: Self-pay

## 2018-08-01 DIAGNOSIS — O30032 Twin pregnancy, monochorionic/diamniotic, second trimester: Secondary | ICD-10-CM

## 2018-08-01 DIAGNOSIS — O0993 Supervision of high risk pregnancy, unspecified, third trimester: Secondary | ICD-10-CM

## 2018-08-01 DIAGNOSIS — O30039 Twin pregnancy, monochorionic/diamniotic, unspecified trimester: Secondary | ICD-10-CM

## 2018-08-01 DIAGNOSIS — Z6830 Body mass index (BMI) 30.0-30.9, adult: Secondary | ICD-10-CM

## 2018-08-01 DIAGNOSIS — Z3A18 18 weeks gestation of pregnancy: Secondary | ICD-10-CM | POA: Diagnosis not present

## 2018-08-01 DIAGNOSIS — O99212 Obesity complicating pregnancy, second trimester: Secondary | ICD-10-CM

## 2018-08-04 ENCOUNTER — Encounter: Payer: Medicaid Other | Admitting: Obstetrics and Gynecology

## 2018-08-04 ENCOUNTER — Other Ambulatory Visit: Payer: Self-pay

## 2018-08-04 LAB — NUSWAB VAGINITIS PLUS (VG+)
Candida albicans, NAA: NEGATIVE
Candida glabrata, NAA: NEGATIVE
Chlamydia trachomatis, NAA: NEGATIVE
Neisseria gonorrhoeae, NAA: NEGATIVE
Trich vag by NAA: NEGATIVE

## 2018-08-08 ENCOUNTER — Ambulatory Visit: Payer: Medicaid Other | Admitting: Dietician

## 2018-08-08 ENCOUNTER — Other Ambulatory Visit: Payer: Medicaid Other

## 2018-08-11 ENCOUNTER — Other Ambulatory Visit: Payer: Medicaid Other

## 2018-08-11 ENCOUNTER — Other Ambulatory Visit: Payer: Self-pay

## 2018-08-11 DIAGNOSIS — O30039 Twin pregnancy, monochorionic/diamniotic, unspecified trimester: Secondary | ICD-10-CM

## 2018-08-15 ENCOUNTER — Other Ambulatory Visit: Payer: Self-pay | Admitting: Obstetrics and Gynecology

## 2018-08-15 ENCOUNTER — Ambulatory Visit
Admission: RE | Admit: 2018-08-15 | Discharge: 2018-08-15 | Disposition: A | Discharge status: Home / Self Care | Payer: Medicaid Other | Source: Ambulatory Visit | Attending: Obstetrics and Gynecology | Admitting: Obstetrics and Gynecology

## 2018-08-15 ENCOUNTER — Other Ambulatory Visit: Payer: Self-pay

## 2018-08-15 DIAGNOSIS — O30032 Twin pregnancy, monochorionic/diamniotic, second trimester: Secondary | ICD-10-CM | POA: Insufficient documentation

## 2018-08-15 DIAGNOSIS — Z3A20 20 weeks gestation of pregnancy: Secondary | ICD-10-CM | POA: Diagnosis not present

## 2018-08-15 DIAGNOSIS — O219 Vomiting of pregnancy, unspecified: Secondary | ICD-10-CM

## 2018-08-15 DIAGNOSIS — O30039 Twin pregnancy, monochorionic/diamniotic, unspecified trimester: Secondary | ICD-10-CM

## 2018-08-15 LAB — GLUCOSE, CAPILLARY: Glucose-Capillary: 80 mg/dL (ref 70–99)

## 2018-08-15 MED ORDER — DOXYLAMINE-PYRIDOXINE 10-10 MG PO TBEC: 2.0000 | DELAYED_RELEASE_TABLET | Freq: Every day | ORAL | 5 refills | Status: DC

## 2018-08-15 NOTE — Progress Notes (Signed)
Pt seen during u/s appointment - she requests diclegis refill - sent in  She c/o back pain - we discussed posture and use of pregnancy support belts  She is having some difficulty with fatigue - we discussed work in pregnancy and twins  I told her no medical issue currently and that 37 weeks is our target with modi twins  Gatha Mayer

## 2018-08-16 ENCOUNTER — Telehealth: Payer: Self-pay | Admitting: Obstetrics & Gynecology

## 2018-08-16 ENCOUNTER — Ambulatory Visit: Payer: Medicaid Other | Admitting: *Deleted

## 2018-08-16 NOTE — Telephone Encounter (Signed)
Tried calling pt voice mail not set up. Please schedule appt when pt calls back. Twin pregnancy.

## 2018-08-16 NOTE — Telephone Encounter (Signed)
-----   Message from Gae Dry, MD sent at 08/16/2018  1:21 PM EDT ----- Regarding: Needs  HROB appt this week, any provider

## 2018-08-18 NOTE — Telephone Encounter (Signed)
Can you help get pt scheduled? She returned Dr. Doreene Adas phone call. Thank you

## 2018-08-19 ENCOUNTER — Ambulatory Visit (INDEPENDENT_AMBULATORY_CARE_PROVIDER_SITE_OTHER): Payer: Medicaid Other | Admitting: Obstetrics & Gynecology

## 2018-08-19 ENCOUNTER — Other Ambulatory Visit (HOSPITAL_COMMUNITY)
Admission: RE | Admit: 2018-08-19 | Discharge: 2018-08-19 | Disposition: A | Discharge status: Home / Self Care | Payer: Medicaid Other | Source: Ambulatory Visit | Attending: Obstetrics & Gynecology | Admitting: Obstetrics & Gynecology

## 2018-08-19 ENCOUNTER — Other Ambulatory Visit: Payer: Self-pay

## 2018-08-19 ENCOUNTER — Encounter: Payer: Self-pay | Admitting: Obstetrics & Gynecology

## 2018-08-19 VITALS — BP 100/70 | Wt 177.0 lb

## 2018-08-19 DIAGNOSIS — Z3A20 20 weeks gestation of pregnancy: Secondary | ICD-10-CM

## 2018-08-19 DIAGNOSIS — O30032 Twin pregnancy, monochorionic/diamniotic, second trimester: Secondary | ICD-10-CM

## 2018-08-19 DIAGNOSIS — O0992 Supervision of high risk pregnancy, unspecified, second trimester: Secondary | ICD-10-CM

## 2018-08-19 DIAGNOSIS — R7309 Other abnormal glucose: Secondary | ICD-10-CM

## 2018-08-19 DIAGNOSIS — O0993 Supervision of high risk pregnancy, unspecified, third trimester: Secondary | ICD-10-CM

## 2018-08-19 DIAGNOSIS — O30039 Twin pregnancy, monochorionic/diamniotic, unspecified trimester: Secondary | ICD-10-CM

## 2018-08-19 DIAGNOSIS — B373 Candidiasis of vulva and vagina: Secondary | ICD-10-CM | POA: Diagnosis not present

## 2018-08-19 DIAGNOSIS — B3731 Acute candidiasis of vulva and vagina: Secondary | ICD-10-CM

## 2018-08-19 MED ORDER — TERCONAZOLE 0.4 % VA CREA: 1.0000 | TOPICAL_CREAM | Freq: Every day | VAGINAL | 0 refills | Status: DC

## 2018-08-19 NOTE — Addendum Note (Signed)
Addended by: Gae Dry on: 08/19/2018 09:15 AM   Modules accepted: Orders

## 2018-08-19 NOTE — Progress Notes (Signed)
Subjective  Fetal Movement? yes Contractions? no Leaking Fluid? no Vaginal Bleeding? no Vag d/c LBP Objective  BP 100/70   Wt 177 lb (80.3 kg)   LMP 03/27/2018   BMI 33.44 kg/m  General: NAD Pumonary: no increased work of breathing Abdomen: gravid, non-tender Extremities: no edema Psychiatric: mood appropriate, affect full  Assessment  28 y.o. G3P1011 at 3340w5d by  01/01/2019, by Last Menstrual Period presenting for routine prenatal visit  Plan   Problem List Items Addressed This Visit      Other   Supervision of high-risk pregnancy   Twin pregnancy, monochorionic/diamniotic, unspecified trimester    Other Visit Diagnoses    [redacted] weeks gestation of pregnancy    -  Primary   Elevated glucose tolerance test       Relevant Orders   Hemoglobin A1c   Candida vaginitis       Relevant Medications   terconazole (TERAZOL 7) 0.4 % vaginal cream      pregnancy3 Problems (from 03/27/18 to present)    Problem Noted Resolved   BMI 30.0-30.9,adult 07/08/2018 by Conard NovakJackson, Stephen D, MD No   Twin pregnancy, monochorionic/diamniotic, unspecified trimester 05/27/2018 by Tresea MallGledhill, Jane, CNM No   Overview Signed 06/15/2018  9:15 PM by Natale MilchSchuman, Christanna R, MD    Monochorionic Diamniotic Twin Pregnancy Plan  Mom should take 60-120mg  of elemental iron a day as well as 1mg  of folic acid  11-14 wks: Evaluate for early growth discordance between  16 wks: Begin Ultrasounds for TTTS checks every 2 weeks (monitor MVP* for each twin and visualize the bladder) 18-20 wks:  Anatomy Ultrasound 22 wks: Fetal Echo 20-Delivery: Growth Ultrasounds every 2-4 weeks** 32 wks: Initiate NSTs  Delivery No complications: 34 0/7 - 37 6/7 Isolated IUGR: 32 0/7- 34 6/7  Genetic screening offered with first trimester screen (11-14 wks) or CVS sampling (10-12 wks)  Risks: TTTS complicates up to 10-15% of monochorionic pregnancies TAPS complicates 3-5% of monochorionic pregnancies TRAP complicates 1% of  monochorionic pregnancies A fetal demise is one twin results in a 40-50% risk of death of neurologic handicap in the surviving twin There is a ninefold increased risk of congenital heart defects, 4-5%. Normal risk in 0.5%. Perinatal loss and handicap rate is 3-5 times higher than a dichorionic pregnancy  *MVP for either twin > 8cm or < 2cm should result in MFM referral Weekly US if issues with MVP arise  **IUGR or Growth discordance > 20% should prompt MFM consultation CALCULATOR: http://perinatology.com/calculators/Twin%20Discordance.htm       Obesity affecting pregnancy 05/27/2018 by Tresea MallGledhill, Jane, CNM No   Supervision of high-risk pregnancy 05/10/2018 by Nadara MustardHarris, Robert P, MD No   Overview Addendum 07/25/2018 10:05 AM by Natale MilchSchuman, Christanna R, MD    [facility]  Dating  LMP = 11 wk US Blood type: A/Positive/-- (06/26 1530)   Genetic Screen Declines  Antibody:Negative (06/26 1530)  Anatomic US  Rubella: 3.27 (06/26 1530) Varicella: Immune  GTT Early: 187 [ ]  3hr/monitoring    Third trimester:  RPR: Non Reactive (06/26 1530)   Rhogam  HBsAg: Negative (06/26 1530)   TDaP vaccine                       Flu Shot: HIV: Non Reactive (06/26 1530)   Baby Food  GBS:   Contraception  Pap: no record of recent pap  Need Valtrex suppression   CS/VBAC N/a   Support Person             Belly band and support info gv for low back pain Terazol for yeast infection Delivery planning discussed- CS vs vag del twins Blood glucose measuring- she has min values to review and not well timed    Counseled to get FBS and 2 hour PPBS and bring back next visit    Also AIC level Cont MFM [place] and monitoring of MoDi twins   Barnett Applebaum, MD, [place] 08/19/2018  9:11 AM

## 2018-08-19 NOTE — Patient Instructions (Signed)
Compression Garments (belly bands, support stockings and pospartum recovery garment) are available thru [employer] (one of the companies that also offers breast pumps). Listed below is the link where the patient or clinical staff can click & complete information online that will submit an inquiry of whether the patient's insurance will cover this or not. Information needed is as follows:  patient name, address, dob, phone #, email address, Due Date, Providers name & phone #, patient's insurance type & ID#. Medicaid is not a covering insurance as far as I know. Please let me know if you have any questions.  [email or web address]

## 2018-08-20 LAB — HEMOGLOBIN A1C
Est. average glucose Bld gHb Est-mCnc: 97 mg/dL
Hgb A1c MFr Bld: 5 % (ref 4.8–5.6)

## 2018-08-25 ENCOUNTER — Telehealth: Payer: Self-pay

## 2018-08-25 ENCOUNTER — Other Ambulatory Visit: Payer: Self-pay | Admitting: Obstetrics & Gynecology

## 2018-08-25 ENCOUNTER — Other Ambulatory Visit: Payer: Self-pay

## 2018-08-25 DIAGNOSIS — O30039 Twin pregnancy, monochorionic/diamniotic, unspecified trimester: Secondary | ICD-10-CM

## 2018-08-25 LAB — CERVICOVAGINAL ANCILLARY ONLY
Bacterial vaginitis: NEGATIVE
Candida vaginitis: NEGATIVE
Chlamydia: POSITIVE — AB
Neisseria Gonorrhea: NEGATIVE
Trichomonas: NEGATIVE

## 2018-08-25 MED ORDER — AZITHROMYCIN 500 MG PO TABS: 1000.0000 mg | ORAL_TABLET | Freq: Once | ORAL | 0 refills | Status: DC

## 2018-08-25 MED ORDER — AZITHROMYCIN 500 MG PO TABS: 1000.0000 mg | ORAL_TABLET | Freq: Once | ORAL | 0 refills | Status: AC

## 2018-08-25 NOTE — Telephone Encounter (Signed)
Pt called wanting a call back.  [phone]  Pt took azithromycin and threw it up.  What to do?  Does she have to take it again? [phone]

## 2018-08-25 NOTE — Telephone Encounter (Signed)
Please advise 

## 2018-08-25 NOTE — Telephone Encounter (Signed)
Pt aware.

## 2018-08-25 NOTE — Progress Notes (Signed)
Discussed pos chlamydia culture Azithromycin tx Partners should be advised, treated TOC 2-4 weeks  Barnett Applebaum, MD, [facility], [facility] 08/25/2018  12:14 PM

## 2018-08-25 NOTE — Telephone Encounter (Signed)
Yes must get refill and take again Try at a different time and w food

## 2018-08-26 ENCOUNTER — Emergency Department
Admission: EM | Admit: 2018-08-26 | Discharge: 2018-08-26 | Disposition: A | Discharge status: Home / Self Care | Payer: Medicaid Other | Attending: Emergency Medicine | Admitting: Emergency Medicine

## 2018-08-26 ENCOUNTER — Encounter: Payer: Self-pay | Admitting: Physician Assistant

## 2018-08-26 ENCOUNTER — Other Ambulatory Visit: Payer: Self-pay

## 2018-08-26 DIAGNOSIS — Z79899 Other long term (current) drug therapy: Secondary | ICD-10-CM | POA: Insufficient documentation

## 2018-08-26 DIAGNOSIS — Z7982 Long term (current) use of aspirin: Secondary | ICD-10-CM | POA: Diagnosis not present

## 2018-08-26 DIAGNOSIS — O9989 Other specified diseases and conditions complicating pregnancy, childbirth and the puerperium: Secondary | ICD-10-CM | POA: Diagnosis not present

## 2018-08-26 DIAGNOSIS — Z3A21 21 weeks gestation of pregnancy: Secondary | ICD-10-CM | POA: Insufficient documentation

## 2018-08-26 DIAGNOSIS — K029 Dental caries, unspecified: Secondary | ICD-10-CM | POA: Diagnosis not present

## 2018-08-26 DIAGNOSIS — K0889 Other specified disorders of teeth and supporting structures: Secondary | ICD-10-CM | POA: Insufficient documentation

## 2018-08-26 MED ORDER — AMOXICILLIN 500 MG PO CAPS: 500.0000 mg | ORAL_CAPSULE | Freq: Three times a day (TID) | ORAL | 0 refills | Status: AC

## 2018-08-26 MED ORDER — LIDOCAINE-EPINEPHRINE 2 %-1:100000 IJ SOLN
1.7000 mL | Freq: Once | INTRAMUSCULAR | Status: DC
  Filled 2018-08-26: qty 1.7

## 2018-08-26 NOTE — ED Provider Notes (Signed)
[facility] Emergency Department Provider Note ____________________________________________  Time seen: 2013  I have reviewed the triage vital signs and the nursing notes.  HISTORY  Chief Complaint  Dental Pain  HPI Katelyn Mendoza is a 28 y.o. female presents itself to the ED for evaluation of persistent dental pain.  Patient is 21 weeks with a twin gestation.  She recalls pain is sensitivity to the same to prior to her confirm pregnancy.  She notes over the last 2 to 3 days she has had continued and persistent pain from the same to that same to refer to the anterior portion of the axilla.  She denies any interim fevers, chills, or sweats.  She also denies any difficulty breathing, swallowing, or controlling secretions.  She has been taking Tylenol as directed but denies any meaningful pain relief.  She presents now for evaluation management of her acute dental pain.  Past Medical History:  Diagnosis Date  . Gallstones   . HSV (herpes simplex virus) infection   . Mental disorder    bipolar disorder dx'd at age 45  . Sickle cell trait ([place])   . Spinal headache   . Urinary tract infection affecting care of mother, antepartum     Patient Active Problem List   Diagnosis Date Noted  . Family history of Down syndrome   . Sickle cell trait in mother affecting pregnancy ([place])   . BMI 30.0-30.9,adult 07/08/2018  . Sickle cell trait in mother affecting childbirth ([place]) 07/04/2018  . Bipolar I disorder ([place]) 07/04/2018  . Gallstones 07/04/2018  . Vulvovaginitis due to yeast 07/04/2018  . Twin pregnancy, monochorionic/diamniotic, unspecified trimester 05/27/2018  . Obesity affecting pregnancy 05/27/2018  . Supervision of high-risk pregnancy 05/10/2018  . 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 Nausea and vomiting during pregnancy 05/10/2018    Past Surgical History:  Procedure Laterality Date  . NO PAST SURGERIES      Prior to Admission medications   Medication Sig Start  Date End Date Taking? Authorizing Provider  Accu-Chek FastClix Lancets MISC 1 Units by Percutaneous route 4 (four) times daily. 07/22/18   Schuman, Stefanie Libel, MD  amoxicillin (AMOXIL) 500 MG capsule Take 1 capsule (500 mg total) by mouth 3 (three) times daily for 10 days. 08/26/18 09/05/18  , Dannielle Karvonen, PA-C  aspirin 81 MG chewable tablet Chew 1 tablet (81 mg total) by mouth daily. 07/04/18   Gatha Mayer, MD  Blood Glucose Monitoring Suppl (ACCU-CHEK NANO SMARTVIEW) w/Device KIT 1 kit by Subdermal route as directed. Check blood sugars for fasting, and two hours after breakfast, lunch and dinner (4 checks daily) 07/22/18   Schuman, Stefanie Libel, MD  Butalbital-APAP-Caffeine [phone] MG capsule Take 1-2 capsules by mouth every 6 (six) hours as needed for headache. 07/22/18   Schuman, Stefanie Libel, MD  docusate sodium (COLACE) 100 MG capsule Take 1 capsule (100 mg total) by mouth 2 (two) times daily. 07/04/18 07/04/19  Gatha Mayer, MD  Doxylamine-Pyridoxine (DICLEGIS) 10-10 MG TBEC Take 2 tablets by mouth at bedtime. If symptoms persist, add one tablet in the morning and one in the afternoon 08/15/18   Gatha Mayer, MD  ferrous sulfate (FERROUSUL) 325 (65 FE) MG tablet Take 1 tablet (325 mg total) by mouth 2 (two) times daily with a meal. 07/22/18   Schuman, Christanna R, MD  glucose blood (ACCU-CHEK SMARTVIEW) test strip Use as instructed to check blood sugars 07/22/18   Schuman, Christanna R, MD  metoCLOPramide (REGLAN) 5 MG tablet Take 1  tablet (5 mg total) by mouth every 8 (eight) hours as needed for nausea or vomiting. 05/05/18   Desirai Traxler, Dannielle Karvonen, PA-C  ondansetron (ZOFRAN) 4 MG tablet Take 1 tablet (4 mg total) by mouth daily as needed for nausea or vomiting. 07/04/18 07/04/19  Gatha Mayer, MD  potassium chloride (K-DUR) 10 MEQ tablet Take 1 tablet (10 mEq total) by mouth 2 (two) times daily for 3 days. 07/22/18 07/25/18  Homero Fellers, MD   Prenatal Vit-Fe Fumarate-FA (PRENATAL MULTIVITAMIN) TABS tablet Take 1 tablet by mouth daily at 12 noon.    [provider]  terconazole (TERAZOL 7) 0.4 % vaginal cream Place 1 applicator vaginally once a week. 07/22/18   Schuman, Stefanie Libel, MD  terconazole (TERAZOL 7) 0.4 % vaginal cream Place 1 applicator vaginally at bedtime. 08/19/18   Gae Dry, MD  triamcinolone ointment (KENALOG) 0.5 % Apply 1 application topically 2 (two) times daily. Apply to vulva sparingly 07/04/18   Gatha Mayer, MD    Allergies Patient has no known allergies.  Family History  Problem Relation Age of Onset  . Diabetes Mother   . Hypertension Mother     Social History Social History   Tobacco Use  . Smoking status: Never Smoker  . Smokeless tobacco: Never Used  Substance Use Topics  . Alcohol use: No  . Drug use: No    Review of Systems  Constitutional: Negative for fever. Eyes: Negative for visual changes. ENT: Negative for sore throat.  Dental pain as above. Cardiovascular: Negative for chest pain. Respiratory: Negative for shortness of breath. Gastrointestinal: Negative for abdominal pain, vomiting and diarrhea. Genitourinary: Negative for dysuria. Musculoskeletal: Negative for back pain. Skin: Negative for rash. Neurological: Negative for headaches, focal weakness or numbness. ____________________________________________  PHYSICAL EXAM:  VITAL SIGNS: ED Triage Vitals [08/26/18 1953]  Enc Vitals Group     BP (!) 125/98     Pulse Rate 83     Resp 18     Temp 98 F (36.7 C)     Temp Source Oral     SpO2 100 %     Weight 176 lb (79.8 kg)     Height 5' 1" (1.549 m)     Head Circumference      Peak Flow      Pain Score 10     Pain Loc      Pain Edu?      Excl. in [place]?     Constitutional: Alert and oriented. Well appearing and in no distress. Head: Normocephalic and atraumatic. Eyes: Conjunctivae are normal. Normal extraocular movements Mouth/Throat:  Mucous membranes are moist.  Uvula is midline and tonsils are flat.  No oropharyngeal lesions are appreciated.  Patient localized tenderness to the upper right second molar.  There is an amalgam filling in place to the occlusal surface, no defect to the tooth or amalgam is appreciated.  No focal gum swelling, pointing, fluctuance, spontaneous drainage is appreciated.  No brawny sublingual erythema is appreciated. Neck: Supple.  Hematological/Lymphatic/Immunological: No cervical lymphadenopathy. Cardiovascular: Normal rate, regular rhythm. Normal distal pulses. Respiratory: Normal respiratory effort. No wheezes/rales/rhonchi. Gastrointestinal:  Gravid Musculoskeletal: Nontender with normal range of motion in all extremities.  Neurologic:  Normal gait without ataxia. Normal speech and language. No gross focal neurologic deficits are appreciated. Skin:  Skin is warm, dry and intact. No rash noted. ____________________________________________  PROCEDURES  Dental Block  Date/Time: 08/26/2018 8:22 PM Performed by: Melvenia Needles, PA-C Authorized by: Lillia Mountain  Nonda Lou, PA-C   Consent:    Consent obtained:  Verbal   Consent given by:  Patient   Risks discussed:  Pain and unsuccessful block Indications:    Indications: dental pain   Location:    Block type:  Supraperiosteal   Supraperiosteal location:  Upper teeth   Upper teeth location:  2/RU 2nd molar Procedure details (see MAR for exact dosages):    Syringe type:  Controlled syringe   Needle gauge:  27 G   Anesthetic injected:  Lidocaine 2% WITH epi   Injection procedure:  Anatomic landmarks identified, incremental injection, introduced needle and anatomic landmarks palpated Post-procedure details:    Outcome:  Anesthesia achieved   Patient tolerance of procedure:  Tolerated well, no immediate complications  ____________________________________________  INITIAL IMPRESSION / ASSESSMENT AND PLAN / ED COURSE  Katelyn Mendoza was evaluated in Emergency Department on 08/26/2018 for the symptoms described in the history of present illness. She was evaluated in the context of the global COVID-19 pandemic, which necessitated consideration that the patient might be at risk for infection with the SARS-CoV-2 virus that causes COVID-19. Institutional protocols and algorithms that pertain to the evaluation of patients at risk for COVID-19 are in a state of rapid change based on information released by regulatory bodies including the CDC and federal and state organizations. These policies and algorithms were followed during the patient's care in the ED.  Patient with ED evaluation of acute dental pain secondary to dental caries.  Notification of a focal abscess at this time.  Patient has requested and is given a dental block procedure which allows for complete anesthesia.  She is also discharged with an empiric prescription for amoxicillin for possible dental abscess.  She will follow-up with primary provider or dental provider for definitive management.  Return precautions have been reviewed. ____________________________________________  FINAL CLINICAL IMPRESSION(S) / ED DIAGNOSES  Final diagnoses:  Pain due to dental caries  Toothache      Carmie End, Dannielle Karvonen, PA-C 08/26/18 2206    Nena Polio, MD 08/26/18 2302

## 2018-08-26 NOTE — Discharge Instructions (Addendum)
You have been treated for dental pain, likely due to a cavity. Take the antibiotic as directed. Continue to take OTC Tylenol for pain. Follow-up with a local dental provider for further management.   OPTIONS FOR DENTAL FOLLOW UP CARE  [place] [facility] and [place] [email or web address] [phone])  [facility] [phone])  [place] [phone])  [place] [phone])  [facility] [phone]) This clinic caters to the indigent population and is on a lottery system. Location: [employer] [facility], [place], [place], [place] Clinic Hours: Wednesdays from 6pm - 9pm, patients seen by a lottery system. For dates, call or go to [email or web address] Services: Cleanings, fillings and simple extractions. Payment Options: DENTAL WORK IS FREE OF CHARGE. Bring proof of income or support. Best way to get seen: Arrive at 5:15 pm - this is a lottery, NOT first come/first serve, so arriving earlier will not increase your chances of being seen.     [facility] [phone] Select option 1 for emergencies   Location: [facility], [place], [address], [place] Clinic Hours: No walk-ins accepted - call the day before to schedule an appointment. Check in times are 9:30 am and 1:30 pm. Services: Simple extractions, temporary fillings, pulpectomy/pulp debridement, uncomplicated abscess drainage. Payment Options: PAYMENT IS DUE AT THE TIME OF SERVICE.  Fee is usually $100-200, additional surgical procedures (e.g. abscess drainage) may be extra. Cash, checks, Visa/MasterCard accepted.  Can file Medicaid if patient is covered for dental - patient should call case worker to check. No discount for [facility]  patients. Best way to get seen: MUST call the day before and get onto the schedule. Can usually be seen the next 1-2 days. No walk-ins accepted.     [place] [phone]   Location: [place], [place] Clinic Hours: M, W, Th, F 8am or 1:30pm, Tues 9a or 1:30 - first come/first served. Services: Simple extractions, temporary fillings, uncomplicated abscess drainage.  You do not need to be an [facility] resident. Payment Options: PAYMENT IS DUE AT THE TIME OF SERVICE. Dental insurance, otherwise sliding scale - bring proof of income or support. Depending on income and treatment needed, cost is usually $50-200. Best way to get seen: Arrive early as it is first come/first served.     [facility] [phone]   Location: [place] [facility] Hours: Mon-Thu 8a-5p Services: Most basic dental services including extractions and fillings. Payment Options: PAYMENT IS DUE AT THE TIME OF SERVICE. Sliding scale, up to 50% off - bring proof if income or support. Medicaid with dental option accepted. Best way to get seen: Call to schedule an appointment, can usually be seen within 2 weeks OR they will try to see walk-ins - show up at [place] or 2p (you may have to wait).     [facility] [place] RESIDENTS ONLY   Location: [facility], [place] [address] [place], [place] [address] Clinic Hours: By appointment only. Monday - Thursday 8am-5pm, Friday 8am-12pm Services: Cleanings, fillings, extractions. Payment Options: PAYMENT IS DUE AT THE TIME OF SERVICE. Cash, Visa or MasterCard. Sliding scale - $30 minimum per service. Best way to get seen: Come in to office, complete packet and make an appointment - need proof of income or support  monies for each household member and proof of [facility] residence. Usually takes about a month to  get in.     [facility] [phone]   Location: [address] [place] Clinic Hours: Walk-in Urgent Care Dental Services are offered Monday-Friday mornings only. The numbers of emergencies accepted daily is limited to the number of providers available. Maximum 15 - Mondays, Wednesdays & Thursdays Maximum 10 - Tuesdays & Fridays Services: You do not need to be a [facility] resident to be seen for a dental emergency. Emergencies are defined as pain, swelling, abnormal bleeding, or dental trauma. Walkins will receive x-rays if needed. NOTE: Dental cleaning is not an emergency. Payment Options: PAYMENT IS DUE AT THE TIME OF SERVICE. Minimum co-pay is $40.00 for uninsured patients. Minimum co-pay is $3.00 for Medicaid with dental coverage. Dental Insurance is accepted and must be presented at time of visit. Medicare does not cover dental. Forms of payment: Cash, credit card, checks. Best way to get seen: If not previously registered with the clinic, walk-in dental registration begins at 7:15 am and is on a first come/first serve basis. If previously registered with the clinic, call to make an appointment.     [facility] [place] ONLY   Location: [address] [place], [place] Clinic Hours: Mon-Thu 10a-2p Services: Extractions only! Payment Options: FREE (donations accepted) - bring proof of income or support Best way to get seen: Call and schedule an appointment OR come at 8am on the 1st Monday of every month (except for holidays) when it is first come/first served.     [facility] [phone]   Location: [place], [place] Clinic Hours: Friday mornings Services, Payment Options, Best way to get seen: Call for info

## 2018-08-26 NOTE — ED Triage Notes (Signed)
Pt with frontal tooth pain for a couple of days. Pt denies fever, no obvious decay noted. Pt states teeth are sensitive. Pt is [redacted] weeks pregnant  And has no pregnancy related complaint.

## 2018-08-29 ENCOUNTER — Ambulatory Visit
Admission: RE | Admit: 2018-08-29 | Discharge: 2018-08-29 | Disposition: A | Discharge status: Home / Self Care | Payer: Medicaid Other | Source: Ambulatory Visit | Attending: Maternal & Fetal Medicine | Admitting: Maternal & Fetal Medicine

## 2018-08-29 ENCOUNTER — Other Ambulatory Visit: Payer: Self-pay

## 2018-08-29 DIAGNOSIS — O30039 Twin pregnancy, monochorionic/diamniotic, unspecified trimester: Secondary | ICD-10-CM | POA: Diagnosis present

## 2018-08-29 DIAGNOSIS — O99210 Obesity complicating pregnancy, unspecified trimester: Secondary | ICD-10-CM

## 2018-08-29 DIAGNOSIS — Z3A22 22 weeks gestation of pregnancy: Secondary | ICD-10-CM | POA: Insufficient documentation

## 2018-08-29 DIAGNOSIS — Z6830 Body mass index (BMI) 30.0-30.9, adult: Secondary | ICD-10-CM

## 2018-08-29 DIAGNOSIS — O0990 Supervision of high risk pregnancy, unspecified, unspecified trimester: Secondary | ICD-10-CM

## 2018-09-05 ENCOUNTER — Ambulatory Visit: Payer: Self-pay

## 2018-09-05 ENCOUNTER — Other Ambulatory Visit: Payer: Self-pay

## 2018-09-05 DIAGNOSIS — Z717 Human immunodeficiency virus [HIV] counseling: Secondary | ICD-10-CM

## 2018-09-05 DIAGNOSIS — Z708 Other sex counseling: Secondary | ICD-10-CM

## 2018-09-05 NOTE — Progress Notes (Signed)
ROI signed and copy of results provided to client per [facility] policy. Rich Number, RN

## 2018-09-06 ENCOUNTER — Other Ambulatory Visit (HOSPITAL_COMMUNITY)
Admission: RE | Admit: 2018-09-06 | Discharge: 2018-09-06 | Disposition: A | Discharge status: Home / Self Care | Payer: Medicaid Other | Source: Ambulatory Visit | Attending: Obstetrics & Gynecology | Admitting: Obstetrics & Gynecology

## 2018-09-06 ENCOUNTER — Ambulatory Visit (INDEPENDENT_AMBULATORY_CARE_PROVIDER_SITE_OTHER): Payer: Medicaid Other | Admitting: Obstetrics & Gynecology

## 2018-09-06 ENCOUNTER — Encounter: Payer: Self-pay | Admitting: Obstetrics & Gynecology

## 2018-09-06 VITALS — BP 110/60 | Wt 180.0 lb

## 2018-09-06 DIAGNOSIS — O98312 Other infections with a predominantly sexual mode of transmission complicating pregnancy, second trimester: Secondary | ICD-10-CM | POA: Insufficient documentation

## 2018-09-06 DIAGNOSIS — O0992 Supervision of high risk pregnancy, unspecified, second trimester: Secondary | ICD-10-CM

## 2018-09-06 DIAGNOSIS — A568 Sexually transmitted chlamydial infection of other sites: Secondary | ICD-10-CM

## 2018-09-06 DIAGNOSIS — O30032 Twin pregnancy, monochorionic/diamniotic, second trimester: Secondary | ICD-10-CM

## 2018-09-06 DIAGNOSIS — O30039 Twin pregnancy, monochorionic/diamniotic, unspecified trimester: Secondary | ICD-10-CM

## 2018-09-06 DIAGNOSIS — O0993 Supervision of high risk pregnancy, unspecified, third trimester: Secondary | ICD-10-CM

## 2018-09-06 DIAGNOSIS — Z3A23 23 weeks gestation of pregnancy: Secondary | ICD-10-CM

## 2018-09-06 NOTE — Progress Notes (Signed)
Prenatal Visit Note Date: 09/06/2018 Clinic: [facility]  Subjective:  Katelyn Mendoza is a 28 y.o. G3P1011 at [redacted]w[redacted]d being seen today for ongoing prenatal care.  She is currently monitored for the following issues for this high-risk pregnancy and has Supervision of high-risk pregnancy; HSV infection; Nausea and vomiting during pregnancy; Twin pregnancy, monochorionic/diamniotic, unspecified trimester; Obesity affecting pregnancy; Sickle cell trait in mother affecting childbirth [facility]); Bipolar I disorder ([place]); Gallstones; Vulvovaginitis due to yeast; BMI 30.0-30.9,adult; Family history of Down syndrome; Sickle cell trait in mother affecting pregnancy ([place]); and Chlamydia trachomatis infection in mother during second trimester of pregnancy on their problem list.  Patient reports backache.   Contractions: Not present. Vag. Bleeding: None.  Movement: Present. Denies leaking of fluid.   The following portions of the patient's history were reviewed and updated as appropriate: allergies, current medications, past family history, past medical history, past social history, past surgical history and problem list. Problem list updated.  Objective:   Vitals:   09/06/18 1506  BP: 110/60  Weight: 180 lb (81.6 kg)    Fetal Status:     Movement: Present     General:  Alert, oriented and cooperative. Patient is in no acute distress.  Skin: Skin is warm and dry. No rash noted.   Cardiovascular: Normal heart rate noted  Respiratory: Normal respiratory effort, no problems with respiration noted  Abdomen: Soft, gravid, appropriate for gestational age. Pain/Pressure: Absent     Pelvic:  Cervical exam performed      closed cervix  Extremities: Normal range of motion.     Mental Status: Normal mood and affect. Normal behavior. Normal judgment and thought content.   Urinalysis:    -/-  Assessment and Plan:  Pregnancy: G3P1011 at [redacted]w[redacted]d  1. [redacted] weeks gestation of pregnancy PNV  2. Supervision of high risk  pregnancy in third trimester DP MFM [place] 09/12/2018 for Mo/Di twins  3. Twin pregnancy, monochorionic/diamniotic, unspecified trimester APT, Delivery planning  4. Chlamydia trachomatis infection in mother during second trimester of pregnancy - Retest today (treated 2 weeks ago) - GC/Chlamydia probe amp ([place])not at [facility]  Preterm labor symptoms and general obstetric precautions including but not limited to vaginal bleeding, contractions, leaking of fluid and fetal movement were reviewed in detail with the patient. Please refer to After Visit Summary for other counseling recommendations.   pregnancy3 Problems (from 03/27/18 to present)    Problem Noted Resolved   BMI 30.0-30.9,adult 07/08/2018 by Will Bonnet, MD No   Twin pregnancy, monochorionic/diamniotic, unspecified trimester 05/27/2018 by Rod Can, CNM No   Overview Signed 06/15/2018  9:15 PM by Homero Fellers, MD    Monochorionic Diamniotic Twin Pregnancy Plan  Mom should take 60-120mg  of elemental iron a day as well as 1mg  of folic acid  44-03 wks: Evaluate for early growth discordance between  16 wks: Begin Ultrasounds for TTTS checks every 2 weeks (monitor MVP* for each twin and visualize the bladder) 18-20 wks:  Anatomy Ultrasound 22 wks: Fetal Echo 20-Delivery: Growth Ultrasounds every 2-4 weeks** 32 wks: Initiate NSTs  Delivery No complications: 34 0/7 - 37 6/7 Isolated IUGR: 32 0/7- 34 6/7  Genetic screening offered with first trimester screen (11-14 wks) or CVS sampling (10-12 wks)  Risks: TTTS complicates up to 47-42% of monochorionic pregnancies TAPS complicates 5-9% of monochorionic pregnancies TRAP complicates 1% of monochorionic pregnancies A fetal demise is one twin results in a 40-50% risk of death of neurologic handicap in the surviving twin There is  a ninefold increased risk of congenital heart defects, 4-5%. Normal risk in 0.5%. Perinatal loss and handicap rate is 3-5 times higher  than a dichorionic pregnancy  *MVP for either twin > 8cm or < 2cm should result in MFM referral Weekly US if issues with MVP arise  **IUGR or Growth discordance > 20% should prompt MFM consultation CALCULATOR: http://perinatology.com/calculators/Twin%20Discordance.htm       Obesity affecting pregnancy 05/27/2018 by Tresea MallGledhill, Jane, CNM No   Supervision of high-risk pregnancy 05/10/2018 by Nadara MustardHarris, Allyssa Abruzzese P, MD No   Overview Addendum 07/25/2018 10:05 AM by Natale MilchSchuman, Christanna R, MD    Clinic [facility] Prenatal Labs  Dating  LMP = 11 wk US Blood type: A/Positive/-- (06/26 1530)   Genetic Screen Declines  Antibody:Negative (06/26 1530)  Anatomic US  Rubella: 3.27 (06/26 1530) Varicella: Immune  GTT Early: 187 [ ]  3hr/monitoring    Third trimester:  RPR: Non Reactive (06/26 1530)   Rhogam  HBsAg: Negative (06/26 1530)   TDaP vaccine                       Flu Shot: HIV: Non Reactive (06/26 1530)   Baby Food                                GBS:   Contraception  Pap: no record of recent pap  Need Valtrex suppression   CS/VBAC N/a   Support Person                Return in about 2 weeks (around 09/20/2018) for HROB.  Annamarie MajorPaul Sol Englert, MD, Merlinda FrederickFACOG [facility], [facility] 09/06/2018  3:43 PM

## 2018-09-06 NOTE — Patient Instructions (Signed)

## 2018-09-08 ENCOUNTER — Other Ambulatory Visit: Payer: Self-pay

## 2018-09-08 DIAGNOSIS — O0992 Supervision of high risk pregnancy, unspecified, second trimester: Secondary | ICD-10-CM

## 2018-09-08 LAB — GC/CHLAMYDIA PROBE AMP (~~LOC~~) NOT AT ARMC
Chlamydia: NEGATIVE
Neisseria Gonorrhea: NEGATIVE

## 2018-09-08 NOTE — Progress Notes (Signed)
Let her know labs normal.

## 2018-09-12 ENCOUNTER — Ambulatory Visit
Admission: RE | Admit: 2018-09-12 | Discharge: 2018-09-12 | Disposition: A | Discharge status: Home / Self Care | Payer: Medicaid Other | Source: Ambulatory Visit | Attending: Obstetrics and Gynecology | Admitting: Obstetrics and Gynecology

## 2018-09-12 ENCOUNTER — Other Ambulatory Visit: Payer: Self-pay

## 2018-09-12 DIAGNOSIS — O30032 Twin pregnancy, monochorionic/diamniotic, second trimester: Secondary | ICD-10-CM | POA: Insufficient documentation

## 2018-09-12 DIAGNOSIS — Z3A24 24 weeks gestation of pregnancy: Secondary | ICD-10-CM | POA: Insufficient documentation

## 2018-09-12 DIAGNOSIS — O0992 Supervision of high risk pregnancy, unspecified, second trimester: Secondary | ICD-10-CM

## 2018-09-12 NOTE — ED Notes (Signed)
Pt at [facility] to day for [place].  On arrival pt c/o left-sided back pressure 10/10 x 2 weeks now.  Pt c/o dental problems/pain that she went to the ER for recently for.  Dr. Lehman Prom, MFM to be notified of these concerns.

## 2018-09-20 ENCOUNTER — Encounter: Payer: Medicaid Other | Admitting: Obstetrics and Gynecology

## 2018-09-20 ENCOUNTER — Observation Stay
Admission: EM | Admit: 2018-09-20 | Discharge: 2018-09-20 | Disposition: A | Discharge status: Home / Self Care | Payer: Medicaid Other | Attending: Obstetrics & Gynecology | Admitting: Obstetrics & Gynecology

## 2018-09-20 ENCOUNTER — Other Ambulatory Visit: Payer: Self-pay

## 2018-09-20 DIAGNOSIS — O23592 Infection of other part of genital tract in pregnancy, second trimester: Principal | ICD-10-CM | POA: Insufficient documentation

## 2018-09-20 DIAGNOSIS — Z6830 Body mass index (BMI) 30.0-30.9, adult: Secondary | ICD-10-CM

## 2018-09-20 DIAGNOSIS — Z3490 Encounter for supervision of normal pregnancy, unspecified, unspecified trimester: Secondary | ICD-10-CM

## 2018-09-20 DIAGNOSIS — Z3A25 25 weeks gestation of pregnancy: Secondary | ICD-10-CM | POA: Diagnosis not present

## 2018-09-20 DIAGNOSIS — O0990 Supervision of high risk pregnancy, unspecified, unspecified trimester: Secondary | ICD-10-CM

## 2018-09-20 DIAGNOSIS — B373 Candidiasis of vulva and vagina: Secondary | ICD-10-CM | POA: Diagnosis not present

## 2018-09-20 DIAGNOSIS — O26892 Other specified pregnancy related conditions, second trimester: Secondary | ICD-10-CM | POA: Diagnosis not present

## 2018-09-20 DIAGNOSIS — O99210 Obesity complicating pregnancy, unspecified trimester: Secondary | ICD-10-CM

## 2018-09-20 DIAGNOSIS — O30039 Twin pregnancy, monochorionic/diamniotic, unspecified trimester: Secondary | ICD-10-CM

## 2018-09-20 LAB — URINALYSIS, ROUTINE W REFLEX MICROSCOPIC
Bilirubin Urine: NEGATIVE
Glucose, UA: NEGATIVE mg/dL
Hgb urine dipstick: NEGATIVE
Ketones, ur: NEGATIVE mg/dL
Leukocytes,Ua: NEGATIVE
Nitrite: NEGATIVE
Protein, ur: NEGATIVE mg/dL
Specific Gravity, Urine: 1.009 (ref 1.005–1.030)
pH: 6 (ref 5.0–8.0)

## 2018-09-20 MED ORDER — FLUCONAZOLE 50 MG PO TABS
150.0000 mg | ORAL_TABLET | Freq: Once | ORAL | Status: AC
  Administered 2018-09-20: 150 mg via ORAL
  Filled 2018-09-20: qty 1

## 2018-09-20 NOTE — Discharge Summary (Signed)
  See FPN 

## 2018-09-20 NOTE — Final Progress Note (Signed)
Physician Final Progress Note  Patient ID: Katelyn Mendoza MRN: [id number] DOB/AGE: Aug 22, 1990 28 y.o.  Admit date: 09/20/2018 Admitting provider: Gae Dry, MD Discharge date: 09/20/2018  Admission Diagnoses: Candidal Vaginitis  Discharge Diagnoses:  Same  Consults: None  Significant Findings/ Diagnostic Studies: Patient presented for evaluation of labor.  Patient had cervical exam by RN and this was reported to me. I reviewed her vital signs and fetal tracing, both of which were reassuring.  Patient was discharge as she was not laboring.  Procedures: UA neg  Discharge Condition: good  Disposition: Home  Diet: Regular diet  Discharge Activity: Activity as tolerated   Allergies as of 09/20/2018   No Known Allergies     Medication List    ASK your doctor about these medications   Accu-Chek FastClix Lancets Misc 1 Units by Percutaneous route 4 (four) times daily.   Accu-Chek Nano SmartView w/Device Kit 1 kit by Subdermal route as directed. Check blood sugars for fasting, and two hours after breakfast, lunch and dinner (4 checks daily)   Accu-Chek SmartView test strip Generic drug: glucose blood Use as instructed to check blood sugars   aspirin 81 MG chewable tablet Chew 1 tablet (81 mg total) by mouth daily.   Butalbital-APAP-Caffeine 50-325-40 MG capsule Take 1-2 capsules by mouth every 6 (six) hours as needed for headache.   docusate sodium 100 MG capsule Commonly known as: Colace Take 1 capsule (100 mg total) by mouth 2 (two) times daily.   Doxylamine-Pyridoxine 10-10 MG Tbec Commonly known as: Diclegis Take 2 tablets by mouth at bedtime. If symptoms persist, add one tablet in the morning and one in the afternoon   ferrous sulfate 325 (65 FE) MG tablet Commonly known as: FerrouSul Take 1 tablet (325 mg total) by mouth 2 (two) times daily with a meal.   metoCLOPramide 5 MG tablet Commonly known as: Reglan Take 1 tablet (5 mg total) by mouth every 8  (eight) hours as needed for nausea or vomiting.   ondansetron 4 MG tablet Commonly known as: Zofran Take 1 tablet (4 mg total) by mouth daily as needed for nausea or vomiting.   potassium chloride 10 MEQ tablet Commonly known as: K-DUR Take 1 tablet (10 mEq total) by mouth 2 (two) times daily for 3 days.   prenatal multivitamin Tabs tablet Take 1 tablet by mouth daily at 12 noon.   terconazole 0.4 % vaginal cream Commonly known as: Terazol 7 Place 1 applicator vaginally once a week.   terconazole 0.4 % vaginal cream Commonly known as: TERAZOL 7 Place 1 applicator vaginally at bedtime.   triamcinolone ointment 0.5 % Commonly known as: KENALOG Apply 1 application topically 2 (two) times daily. Apply to vulva sparingly        Total time spent taking care of this patient: TRIAGE  Signed: Hoyt Koch 09/20/2018, 11:18 PM

## 2018-09-20 NOTE — OB Triage Note (Signed)
Pt. presents to unit after two and a half weeks of lower back pain. She now states the pain is progressing in its intensity, rated 10/10, and is sharp and shooting in its intensity. It is also now specific to her lower left. She was being treated the past few weeks for a dental infection with oral antibiotics, and has suffered continuously throughout her pregnancy with repeated yeast infections. Reports positive fetal movement and denies LOF or vaginal bleeding. Monitors applied and will continue to assess. Vital signs WNL.

## 2018-09-22 ENCOUNTER — Encounter: Payer: Medicaid Other | Admitting: Obstetrics & Gynecology

## 2018-09-22 ENCOUNTER — Other Ambulatory Visit: Payer: Self-pay

## 2018-09-22 DIAGNOSIS — O30039 Twin pregnancy, monochorionic/diamniotic, unspecified trimester: Secondary | ICD-10-CM

## 2018-09-23 ENCOUNTER — Encounter: Payer: Medicaid Other | Admitting: Obstetrics & Gynecology

## 2018-09-23 ENCOUNTER — Telehealth: Payer: Self-pay

## 2018-09-23 NOTE — Telephone Encounter (Signed)
Pt made appt for 09/27/18 with SDJ

## 2018-09-23 NOTE — Telephone Encounter (Signed)
Pt is looking to speak with [facility] pt stated she is having a lot of pain in her vulvar area and it is very bothersome. She also said that she was diagnosed with a yeast infection that she does not think has gone away completely. Please advise

## 2018-09-23 NOTE — Telephone Encounter (Signed)
Pt missed appt today. Schedule (any OB) appt next week

## 2018-09-26 ENCOUNTER — Ambulatory Visit: Payer: Medicaid Other

## 2018-09-26 ENCOUNTER — Other Ambulatory Visit: Payer: Self-pay

## 2018-09-26 ENCOUNTER — Ambulatory Visit
Admission: RE | Admit: 2018-09-26 | Discharge: 2018-09-26 | Disposition: A | Discharge status: Home / Self Care | Payer: Medicaid Other | Source: Ambulatory Visit | Attending: Maternal & Fetal Medicine | Admitting: Maternal & Fetal Medicine

## 2018-09-26 DIAGNOSIS — O43022 Fetus-to-fetus placental transfusion syndrome, second trimester: Secondary | ICD-10-CM | POA: Diagnosis present

## 2018-09-26 DIAGNOSIS — O30039 Twin pregnancy, monochorionic/diamniotic, unspecified trimester: Secondary | ICD-10-CM

## 2018-09-26 DIAGNOSIS — Z3A26 26 weeks gestation of pregnancy: Secondary | ICD-10-CM | POA: Diagnosis not present

## 2018-09-27 ENCOUNTER — Encounter: Payer: Medicaid Other | Admitting: Obstetrics and Gynecology

## 2018-09-30 ENCOUNTER — Ambulatory Visit: Payer: Medicaid Other | Admitting: Physician Assistant

## 2018-09-30 ENCOUNTER — Other Ambulatory Visit: Payer: Self-pay

## 2018-09-30 ENCOUNTER — Encounter: Payer: Self-pay | Admitting: Physician Assistant

## 2018-09-30 DIAGNOSIS — B373 Candidiasis of vulva and vagina: Secondary | ICD-10-CM

## 2018-09-30 DIAGNOSIS — Z113 Encounter for screening for infections with a predominantly sexual mode of transmission: Secondary | ICD-10-CM

## 2018-09-30 DIAGNOSIS — B3731 Acute candidiasis of vulva and vagina: Secondary | ICD-10-CM

## 2018-09-30 LAB — WET PREP FOR TRICH, YEAST, CLUE
Clue Cell Exam: POSITIVE — AB
Trichomonas Exam: NEGATIVE

## 2018-09-30 MED ORDER — CLOTRIMAZOLE 1 % VA CREA: 1.0000 | TOPICAL_CREAM | Freq: Every day | VAGINAL | 0 refills | Status: DC

## 2018-09-30 NOTE — Progress Notes (Signed)
STI clinic/screening visit  Subjective:  Katelyn Mendoza is a 28 y.o. female being seen today for an STI screening visit. The patient reports they do have symptoms.  Patient has the following medical conditions:   Patient Active Problem List   Diagnosis Date Noted  . Indication for care in labor and delivery, antepartum 09/20/2018  . Pregnancy 09/20/2018  . Chlamydia trachomatis infection in mother during second trimester of pregnancy 09/06/2018  . Family history of Down syndrome   . Sickle cell trait in mother affecting pregnancy ([place])   . BMI 30.0-30.9,adult 07/08/2018  . Bipolar I disorder ([place]) 07/04/2018  . Gallstones 07/04/2018  . Vulvovaginitis due to yeast 07/04/2018  . Twin pregnancy, monochorionic/diamniotic, unspecified trimester 05/27/2018  . Obesity affecting pregnancy 05/27/2018  . Supervision of high-risk pregnancy 05/10/2018  . 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 Nausea and vomiting during pregnancy 05/10/2018     Chief Complaint  Patient presents with  . SEXUALLY TRANSMITTED DISEASE    no bloodwork, pregnant, mostly concerned about yeast    HPI  Patient reports that she thinks she has another yeast infection and has been having them a lot during her pregnancy.  Reports itching and white discharge.  Denies other concerns.  Reports that she is 7 months pregnant with twins and was not able to be seen at her provider's office.  See flowsheet for further details and programmatic requirements.    The following portions of the patient's history were reviewed and updated as appropriate: allergies, current medications, past medical history, past social history, past surgical history and problem list.  Objective:  There were no vitals filed for this visit.  Physical Exam Constitutional:      General: She is not in acute distress.    Appearance: Normal appearance.  HENT:     Head: Normocephalic and atraumatic.     Mouth/Throat:     Mouth: Mucous membranes are moist.      Pharynx: Oropharynx is clear. No oropharyngeal exudate or posterior oropharyngeal erythema.  Eyes:     Conjunctiva/sclera: Conjunctivae normal.  Neck:     Musculoskeletal: Neck supple.  Pulmonary:     Effort: Pulmonary effort is normal.  Abdominal:     Tenderness: There is no abdominal tenderness. There is no guarding.     Comments: Abdomen pregnant and without tenderness.  Genitourinary:    General: Normal vulva.     Rectum: Normal.     Comments: External genitalia/pubic area without nits, lice, edema, erythema, lesions, or inguinal adenopathy. Vagina with normal mucosa, moderate amount of thick, clumpy, white discharge, pH=4.5. Cervix without visible lesions. Uterus pregnant ~28-30 week size. Lymphadenopathy:     Cervical: No cervical adenopathy.  Skin:    General: Skin is warm and dry.     Findings: No bruising, erythema, lesion or rash.  Neurological:     Mental Status: She is alert and oriented to person, place, and time.  Psychiatric:        Mood and Affect: Mood normal.        Behavior: Behavior normal.        Thought Content: Thought content normal.        Judgment: Judgment normal.       Assessment and Plan:  Katelyn Mendoza is a 28 y.o. female presenting to the [facility] Department for STI screening  1. Screening for STD (sexually transmitted disease) Patient with symptoms today.  Declines blood work today. Rec condoms with all sex. Await  test results.  Counseled that RN will call if needs to RTC for any treatment once results are back.  [facility]. - WET PREP FOR TRICH, YEAST, CLUE - Chlamydia/Gonorrhea Comfort Lab  2. Candida vaginitis Will give Clotrimazole 1% vaginal cream 1 app qhs for 7 days No sex for 7 days.  - clotrimazole (CLOTRIMAZOLE-7) 1 % vaginal cream; Place 1 Applicatorful vaginally at bedtime.  Dispense: 45 g; Refill: 0     No follow-ups on file.  Future Appointments  Date  Time Provider Department Center  10/03/2018  4:30 PM Nadara MustardHarris, Robert P, MD [facility] None  10/10/2018  2:00 PM [facility] US 1 [facility]  10/24/2018  3:00 PM [facility]    Matt Holmesarla J Bartosz Lugin[place], [place]

## 2018-10-03 ENCOUNTER — Other Ambulatory Visit: Payer: Self-pay

## 2018-10-03 ENCOUNTER — Ambulatory Visit (INDEPENDENT_AMBULATORY_CARE_PROVIDER_SITE_OTHER): Payer: Medicaid Other | Admitting: Obstetrics & Gynecology

## 2018-10-03 ENCOUNTER — Encounter: Payer: Self-pay | Admitting: Obstetrics & Gynecology

## 2018-10-03 VITALS — BP 120/80 | Wt 184.0 lb

## 2018-10-03 DIAGNOSIS — O30039 Twin pregnancy, monochorionic/diamniotic, unspecified trimester: Secondary | ICD-10-CM

## 2018-10-03 DIAGNOSIS — O99212 Obesity complicating pregnancy, second trimester: Secondary | ICD-10-CM

## 2018-10-03 DIAGNOSIS — O0990 Supervision of high risk pregnancy, unspecified, unspecified trimester: Secondary | ICD-10-CM

## 2018-10-03 DIAGNOSIS — Z3A27 27 weeks gestation of pregnancy: Secondary | ICD-10-CM

## 2018-10-03 DIAGNOSIS — O30032 Twin pregnancy, monochorionic/diamniotic, second trimester: Secondary | ICD-10-CM

## 2018-10-03 DIAGNOSIS — O99210 Obesity complicating pregnancy, unspecified trimester: Secondary | ICD-10-CM

## 2018-10-03 DIAGNOSIS — O0992 Supervision of high risk pregnancy, unspecified, second trimester: Secondary | ICD-10-CM

## 2018-10-03 NOTE — Progress Notes (Signed)
Subjective  Fetal Movement? yes Contractions? no Leaking Fluid? no Vaginal Bleeding? no Has lost her BS log and has not been checking lately Has had continued s/sx yeast infection No s/sx PTL Objective  BP 120/80   Wt 184 lb (83.5 kg)   LMP 03/27/2018   BMI 34.77 kg/m  General: NAD Pumonary: no increased work of breathing Abdomen: gravid, non-tender Extremities: no edema Psychiatric: mood appropriate, affect full  Assessment  28 y.o. G3P1011 at [redacted]w[redacted]d by  01/01/2019, by Last Menstrual Period presenting for routine prenatal visit  Plan   Problem List Items Addressed This Visit      Other   Supervision of high-risk pregnancy   Twin pregnancy, monochorionic/diamniotic, unspecified trimester   Obesity affecting pregnancy   Pregnancy - Primary      pregnancy3 Problems (from 03/27/18 to present)    Problem Noted Resolved   BMI 30.0-30.9,adult 07/08/2018 by Will Bonnet, MD No   Twin pregnancy, monochorionic/diamniotic, unspecified trimester 05/27/2018 by Rod Can, CNM No   Overview Signed 06/15/2018  9:15 PM by Homero Fellers, MD    Monochorionic Diamniotic Twin Pregnancy Plan  Mom should take 60-120mg  of elemental iron a day as well as 1mg  of folic acid  30-16 wks: Evaluate for early growth discordance between  16 wks: Begin Ultrasounds for TTTS checks every 2 weeks (monitor MVP* for each twin and visualize the bladder) 18-20 wks:  Anatomy Ultrasound 22 wks: Fetal Echo 20-Delivery: Growth Ultrasounds every 2-4 weeks** 32 wks: Initiate NSTs  Delivery No complications: 34 0/7 - 37 6/7 Isolated IUGR: 32 0/7- 34 6/7  Genetic screening offered with first trimester screen (11-14 wks) or [facility] sampling (10-12 wks)  Risks: TTTS complicates up to 01-09% of monochorionic pregnancies TAPS complicates 3-2% of monochorionic pregnancies TRAP complicates 1% of monochorionic pregnancies A fetal demise is one twin results in a 40-50% risk of death of neurologic  handicap in the surviving twin There is a ninefold increased risk of congenital heart defects, 4-5%. Normal risk in 0.5%. Perinatal loss and handicap rate is 3-5 times higher than a dichorionic pregnancy  *MVP for either twin > 8cm or < 2cm should result in MFM referral Weekly [place] if issues with MVP arise  **IUGR or Growth discordance > 20% should prompt MFM consultation CALCULATOR: [email or web address]       Obesity affecting pregnancy 05/27/2018 by Rod Can, CNM No   Supervision of high-risk pregnancy 05/10/2018 by Gae Dry, MD No   Overview Addendum 07/25/2018 10:05 AM by Homero Fellers, MD    [facility] Prenatal Labs  Dating  LMP = 11 wk [place] Blood type: A/Positive/-- (06/26 1530)   Genetic Screen Declines  Antibody:Negative (06/26 1530)  Anatomic [place]  Rubella: 3.27 (06/26 1530) Varicella: Immune  GTT Early: 187 [ ]  3hr/monitoring    Third trimester:  RPR: Non Reactive (06/26 1530)   Rhogam  HBsAg: Negative (06/26 1530)   TDaP vaccine                       Flu Shot: HIV: Non Reactive (06/26 1530)   Baby Food                                GBS:   Contraception  Pap: no record of recent pap  Need Valtrex suppression   CS/VBAC N/a   Support Person  DP [place] on Monday, growth and dopplers then  Encouraged to restart checking BS daily, FBS and 2 hour PP BS, and keep log, even on paper, so we can see pattern and ensure no risks to her or fetus, or needs for medicine intervention   Also may contribute to chronic yeast infections   PNV   [facility]   Labor precautions  Annamarie Major, MD, [facility], [facility] 10/03/2018  4:59 PM

## 2018-10-03 NOTE — Patient Instructions (Signed)
Third Trimester of Pregnancy The third trimester is from week 28 through week 40 (months 7 through 9). The third trimester is a time when the unborn baby (fetus) is growing rapidly. At the end of the ninth month, the fetus is about 20 inches in length and weighs 6-10 pounds. Body changes during your third trimester Your body will continue to go through many changes during pregnancy. The changes vary from woman to woman. During the third trimester:  Your weight will continue to increase. You can expect to gain 25-35 pounds (11-16 kg) by the end of the pregnancy.  You may begin to get stretch marks on your hips, abdomen, and breasts.  You may urinate more often because the fetus is moving lower into your pelvis and pressing on your bladder.  You may develop or continue to have heartburn. This is caused by increased hormones that slow down muscles in the digestive tract.  You may develop or continue to have constipation because increased hormones slow digestion and cause the muscles that push waste through your intestines to relax.  You may develop hemorrhoids. These are swollen veins (varicose veins) in the rectum that can itch or be painful.  You may develop swollen, bulging veins (varicose veins) in your legs.  You may have increased body aches in the pelvis, back, or thighs. This is due to weight gain and increased hormones that are relaxing your joints.  You may have changes in your hair. These can include thickening of your hair, rapid growth, and changes in texture. Some women also have hair loss during or after pregnancy, or hair that feels dry or thin. Your hair will most likely return to normal after your baby is born.  Your breasts will continue to grow and they will continue to become tender. A yellow fluid (colostrum) may leak from your breasts. This is the first milk you are producing for your baby.  Your belly button may stick out.  You may notice more swelling in your hands,  face, or ankles.  You may have increased tingling or numbness in your hands, arms, and legs. The skin on your belly may also feel numb.  You may feel short of breath because of your expanding uterus.  You may have more problems sleeping. This can be caused by the size of your belly, increased need to urinate, and an increase in your body's metabolism.  You may notice the fetus "dropping," or moving lower in your abdomen (lightening).  You may have increased vaginal discharge.  You may notice your joints feel loose and you may have pain around your pelvic bone. What to expect at prenatal visits You will have prenatal exams every 2 weeks until week 36. Then you will have weekly prenatal exams. During a routine prenatal visit:  You will be weighed to make sure you and the baby are growing normally.  Your blood pressure will be taken.  Your abdomen will be measured to track your baby's growth.  The fetal heartbeat will be listened to.  Any test results from the previous visit will be discussed.  You may have a cervical check near your due date to see if your cervix has softened or thinned (effaced).  You will be tested for Group B streptococcus. This happens between 35 and 37 weeks. Your health care provider may ask you:  What your birth plan is.  How you are feeling.  If you are feeling the baby move.  If you have had any abnormal   symptoms, such as leaking fluid, bleeding, severe headaches, or abdominal cramping.  If you are using any tobacco products, including cigarettes, chewing tobacco, and electronic cigarettes.  If you have any questions. Other tests or screenings that may be performed during your third trimester include:  Blood tests that check for low iron levels (anemia).  Fetal testing to check the health, activity level, and growth of the fetus. Testing is done if you have certain medical conditions or if there are problems during the pregnancy.  Nonstress test  (NST). This test checks the health of your baby to make sure there are no signs of problems, such as the baby not getting enough oxygen. During this test, a belt is placed around your belly. The baby is made to move, and its heart rate is monitored during movement. What is false labor? False labor is a condition in which you feel small, irregular tightenings of the muscles in the womb (contractions) that usually go away with rest, changing position, or drinking water. These are called Braxton Hicks contractions. Contractions may last for hours, days, or even weeks before true labor sets in. If contractions come at regular intervals, become more frequent, increase in intensity, or become painful, you should see your health care provider. What are the signs of labor?  Abdominal cramps.  Regular contractions that start at 10 minutes apart and become stronger and more frequent with time.  Contractions that start on the top of the uterus and spread down to the lower abdomen and back.  Increased pelvic pressure and dull back pain.  A watery or bloody mucus discharge that comes from the vagina.  Leaking of amniotic fluid. This is also known as your "water breaking." It could be a slow trickle or a gush. Let your health care provider know if it has a color or strange odor. If you have any of these signs, call your health care provider right away, even if it is before your due date. Follow these instructions at home: Medicines  Follow your health care provider's instructions regarding medicine use. Specific medicines may be either safe or unsafe to take during pregnancy.  Take a prenatal vitamin that contains at least 600 micrograms (mcg) of folic acid.  If you develop constipation, try taking a stool softener if your health care provider approves. Eating and drinking   Eat a balanced diet that includes fresh fruits and vegetables, whole grains, good sources of protein such as meat, eggs, or tofu,  and low-fat dairy. Your health care provider will help you determine the amount of weight gain that is right for you.  Avoid raw meat and uncooked cheese. These carry germs that can cause birth defects in the baby.  If you have low calcium intake from food, talk to your health care provider about whether you should take a daily calcium supplement.  Eat four or five small meals rather than three large meals a day.  Limit foods that are high in fat and processed sugars, such as fried and sweet foods.  To prevent constipation: ? Drink enough fluid to keep your urine clear or pale yellow. ? Eat foods that are high in fiber, such as fresh fruits and vegetables, whole grains, and beans. Activity  Exercise only as directed by your health care provider. Most women can continue their usual exercise routine during pregnancy. Try to exercise for 30 minutes at least 5 days a week. Stop exercising if you experience uterine contractions.  Avoid heavy lifting.  Do   not exercise in extreme heat or humidity, or at high altitudes.  Wear low-heel, comfortable shoes.  Practice good posture.  You may continue to have sex unless your health care provider tells you otherwise. Relieving pain and discomfort  Take frequent breaks and rest with your legs elevated if you have leg cramps or low back pain.  Take warm sitz baths to soothe any pain or discomfort caused by hemorrhoids. Use hemorrhoid cream if your health care provider approves.  Wear a good support bra to prevent discomfort from breast tenderness.  If you develop varicose veins: ? Wear support pantyhose or compression stockings as told by your healthcare provider. ? Elevate your feet for 15 minutes, 3-4 times a day. Prenatal care  Write down your questions. Take them to your prenatal visits.  Keep all your prenatal visits as told by your health care provider. This is important. Safety  Wear your seat belt at all times when driving.  Make  a list of emergency phone numbers, including numbers for family, friends, the hospital, and police and fire departments. General instructions  Avoid cat litter boxes and soil used by cats. These carry germs that can cause birth defects in the baby. If you have a cat, ask someone to clean the litter box for you.  Do not travel far distances unless it is absolutely necessary and only with the approval of your health care provider.  Do not use hot tubs, steam rooms, or saunas.  Do not drink alcohol.  Do not use any products that contain nicotine or tobacco, such as cigarettes and e-cigarettes. If you need help quitting, ask your health care provider.  Do not use any medicinal herbs or unprescribed drugs. These chemicals affect the formation and growth of the baby.  Do not douche or use tampons or scented sanitary pads.  Do not cross your legs for long periods of time.  To prepare for the arrival of your baby: ? Take prenatal classes to understand, practice, and ask questions about labor and delivery. ? Make a trial run to the hospital. ? Visit the hospital and tour the maternity area. ? Arrange for maternity or paternity leave through employers. ? Arrange for family and friends to take care of pets while you are in the hospital. ? Purchase a rear-facing car seat and make sure you know how to install it in your car. ? Pack your hospital bag. ? Prepare the baby's nursery. Make sure to remove all pillows and stuffed animals from the baby's crib to prevent suffocation.  Visit your dentist if you have not gone during your pregnancy. Use a soft toothbrush to brush your teeth and be gentle when you floss. Contact a health care provider if:  You are unsure if you are in labor or if your water has broken.  You become dizzy.  You have mild pelvic cramps, pelvic pressure, or nagging pain in your abdominal area.  You have lower back pain.  You have persistent nausea, vomiting, or diarrhea.   You have an unusual or bad smelling vaginal discharge.  You have pain when you urinate. Get help right away if:  Your water breaks before 37 weeks.  You have regular contractions less than 5 minutes apart before 37 weeks.  You have a fever.  You are leaking fluid from your vagina.  You have spotting or bleeding from your vagina.  You have severe abdominal pain or cramping.  You have rapid weight loss or weight gain.  You have   shortness of breath with chest pain.  You notice sudden or extreme swelling of your face, hands, ankles, feet, or legs.  Your baby makes fewer than 10 movements in 2 hours.  You have severe headaches that do not go away when you take medicine.  You have vision changes. Summary  The third trimester is from week 28 through week 40, months 7 through 9. The third trimester is a time when the unborn baby (fetus) is growing rapidly.  During the third trimester, your discomfort may increase as you and your baby continue to gain weight. You may have abdominal, leg, and back pain, sleeping problems, and an increased need to urinate.  During the third trimester your breasts will keep growing and they will continue to become tender. A yellow fluid (colostrum) may leak from your breasts. This is the first milk you are producing for your baby.  False labor is a condition in which you feel small, irregular tightenings of the muscles in the womb (contractions) that eventually go away. These are called Braxton Hicks contractions. Contractions may last for hours, days, or even weeks before true labor sets in.  Signs of labor can include: abdominal cramps; regular contractions that start at 10 minutes apart and become stronger and more frequent with time; watery or bloody mucus discharge that comes from the vagina; increased pelvic pressure and dull back pain; and leaking of amniotic fluid. This information is not intended to replace advice given to you by your health  care provider. Make sure you discuss any questions you have with your health care provider. Document Released: 12/23/2000 Document Revised: 04/21/2018 Document Reviewed: 02/04/2016 Elsevier Patient Education  2020 [employer]  

## 2018-10-06 ENCOUNTER — Other Ambulatory Visit: Payer: Self-pay

## 2018-10-06 DIAGNOSIS — O30039 Twin pregnancy, monochorionic/diamniotic, unspecified trimester: Secondary | ICD-10-CM

## 2018-10-10 ENCOUNTER — Ambulatory Visit
Admission: RE | Admit: 2018-10-10 | Discharge: 2018-10-10 | Disposition: A | Discharge status: Home / Self Care | Payer: Medicaid Other | Source: Ambulatory Visit | Attending: Maternal & Fetal Medicine | Admitting: Maternal & Fetal Medicine

## 2018-10-10 ENCOUNTER — Other Ambulatory Visit: Payer: Self-pay

## 2018-10-10 DIAGNOSIS — O30033 Twin pregnancy, monochorionic/diamniotic, third trimester: Secondary | ICD-10-CM | POA: Insufficient documentation

## 2018-10-10 DIAGNOSIS — Z3A28 28 weeks gestation of pregnancy: Secondary | ICD-10-CM | POA: Insufficient documentation

## 2018-10-10 DIAGNOSIS — O30039 Twin pregnancy, monochorionic/diamniotic, unspecified trimester: Secondary | ICD-10-CM

## 2018-10-11 ENCOUNTER — Telehealth: Payer: Self-pay | Admitting: Family Medicine

## 2018-10-11 ENCOUNTER — Telehealth: Payer: Self-pay

## 2018-10-11 NOTE — Telephone Encounter (Signed)
Pt calling c/o still experiencing a jelly like d/c, yellow, mucusy.  [phone]

## 2018-10-11 NOTE — Telephone Encounter (Signed)
TC with patient.  Verified ID via password. Explained that GC/Chl results are not back yet. Patient verbalized understanding Aileen Fass, RN

## 2018-10-11 NOTE — Telephone Encounter (Signed)
Pt reports yellow jelly mucus. Pt reports No odor, No itching, no pains or contractions, is this just normal discharge, anything she should be worried about? Please advise

## 2018-10-11 NOTE — Telephone Encounter (Signed)
Patient called and wanted her tests results

## 2018-10-11 NOTE — Telephone Encounter (Signed)
Keep appt and we can assess then, not likely related to preterm labor or infection based on her experience and description

## 2018-10-12 NOTE — Telephone Encounter (Signed)
Pt aware.

## 2018-10-17 ENCOUNTER — Encounter: Payer: Self-pay | Admitting: Obstetrics & Gynecology

## 2018-10-17 ENCOUNTER — Ambulatory Visit (INDEPENDENT_AMBULATORY_CARE_PROVIDER_SITE_OTHER): Payer: Medicaid Other | Admitting: Obstetrics & Gynecology

## 2018-10-17 ENCOUNTER — Other Ambulatory Visit (HOSPITAL_COMMUNITY)
Admission: RE | Admit: 2018-10-17 | Discharge: 2018-10-17 | Disposition: A | Discharge status: Home / Self Care | Payer: Medicaid Other | Source: Ambulatory Visit | Attending: Obstetrics & Gynecology | Admitting: Obstetrics & Gynecology

## 2018-10-17 ENCOUNTER — Other Ambulatory Visit: Payer: Self-pay

## 2018-10-17 VITALS — BP 120/80 | Wt 186.0 lb

## 2018-10-17 DIAGNOSIS — O24419 Gestational diabetes mellitus in pregnancy, unspecified control: Secondary | ICD-10-CM

## 2018-10-17 DIAGNOSIS — N898 Other specified noninflammatory disorders of vagina: Secondary | ICD-10-CM | POA: Diagnosis present

## 2018-10-17 DIAGNOSIS — O0990 Supervision of high risk pregnancy, unspecified, unspecified trimester: Secondary | ICD-10-CM

## 2018-10-17 DIAGNOSIS — O24410 Gestational diabetes mellitus in pregnancy, diet controlled: Secondary | ICD-10-CM

## 2018-10-17 DIAGNOSIS — Z3A29 29 weeks gestation of pregnancy: Secondary | ICD-10-CM

## 2018-10-17 DIAGNOSIS — D573 Sickle-cell trait: Secondary | ICD-10-CM

## 2018-10-17 DIAGNOSIS — O30033 Twin pregnancy, monochorionic/diamniotic, third trimester: Secondary | ICD-10-CM

## 2018-10-17 DIAGNOSIS — O30039 Twin pregnancy, monochorionic/diamniotic, unspecified trimester: Secondary | ICD-10-CM

## 2018-10-17 DIAGNOSIS — O0993 Supervision of high risk pregnancy, unspecified, third trimester: Secondary | ICD-10-CM

## 2018-10-17 MED ORDER — ACCU-CHEK FASTCLIX LANCETS MISC: 1.0000 [IU] | Freq: Four times a day (QID) | 12 refills | Status: DC

## 2018-10-17 MED ORDER — ACYCLOVIR 200 MG PO CAPS: 200.0000 mg | ORAL_CAPSULE | Freq: Every day | ORAL | 5 refills | Status: DC

## 2018-10-17 NOTE — Progress Notes (Signed)
Subjective  Fetal Movement? yes Contractions? no Leaking Fluid? No, but has had 2 week h/o mucus like d/c Vaginal Bleeding? no Has not been checkong BS.  Needs lancets (has Rx and refills, will reorder and encourage to do so) Objective  BP 120/80   Wt 186 lb (84.4 kg)   LMP 03/27/2018   BMI 35.14 kg/m  General: NAD Pumonary: no increased work of breathing Abdomen: gravid, non-tender Extremities: no edema Psychiatric: mood appropriate, affect full SVE: 1/60/-3, Vtx Assessment  28 y.o. G3P1011 at [redacted]w[redacted]d by  01/01/2019, by Last Menstrual Period presenting for routine prenatal visit  Plan   Problem List Items Addressed This Visit      Other   Supervision of high-risk pregnancy   Twin pregnancy, monochorionic/diamniotic, unspecified trimester   Pregnancy - Primary    Other Visit Diagnoses    Gestational diabetes mellitus (GDM) affecting pregnancy, antepartum       Relevant Medications   Accu-Chek FastClix Lancets MISC   Sickle cell trait ([place])       Relevant Orders   Urine Culture   Vaginal discharge       Relevant Orders   Cervicovaginal ancillary only      pregnancy3 Problems (from 03/27/18 to present)    Problem Noted Resolved   BMI 30.0-30.9,adult 07/08/2018 by Will Bonnet, MD No   Twin pregnancy, monochorionic/diamniotic, unspecified trimester 05/27/2018 by Rod Can, CNM No   Overview Signed 06/15/2018  9:15 PM by Homero Fellers, MD    Monochorionic Diamniotic Twin Pregnancy Plan  Mom should take 60-120mg  of elemental iron a day as well as 1mg  of folic acid  64-40 wks: Evaluate for early growth discordance between  16 wks: Begin Ultrasounds for TTTS checks every 2 weeks (monitor MVP* for each twin and visualize the bladder) 18-20 wks:  Anatomy Ultrasound 22 wks: Fetal Echo 20-Delivery: Growth Ultrasounds every 2-4 weeks**    [facility] 32 wks: Initiate NSTs  Delivery No complications: 34 0/7 - 37 6/7 Isolated IUGR: 32 0/7- 34  6/7  Risks: TTTS complicates up to 34-74% of monochorionic pregnancies TAPS complicates 2-5% of monochorionic pregnancies TRAP complicates 1% of monochorionic pregnancies A fetal demise is one twin results in a 40-50% risk of death of neurologic handicap in the surviving twin There is a ninefold increased risk of congenital heart defects, 4-5%. Normal risk in 0.5%. Perinatal loss and handicap rate is 3-5 times higher than a dichorionic pregnancy  *MVP for either twin > 8cm or < 2cm should result in MFM referral Weekly [place] if issues with MVP arise  **IUGR or Growth discordance > 20% should prompt MFM consultation CALCULATOR: [email or web address]://[email or web address]       Obesity affecting pregnancy 05/27/2018 by Rod Can, CNM No   Supervision of high-risk pregnancy 05/10/2018 by Gae Dry, MD No   Overview Addendum 10/17/2018  4:37 PM by Gae Dry, MD    Clinic [facility] Prenatal Labs  Dating  LMP = 11 wk [place] Blood type: A/Positive/-- (06/26 1530)   Genetic Screen Declines  Antibody:Negative (06/26 1530)  Anatomic US done Rubella: 3.27 (06/26 1530) Varicella: Immune  GTT Early: 64 [x ] 3hr/monitoring     RPR: Non Reactive (06/26 1530)   Rhogam A+ HBsAg: Negative (06/26 1530)   TDaP vaccine Next visitFlu Shot:next visit HIV: Non Reactive (06/26 1530)   Baby Food          Bottle  GBS: p  Contraception      Unsure Pap: no record of recent pap  Need Valtrex suppression   CS/VBAC N/a Desires vaginal delivery of twins  Support Person             [place] reviewed from 9/28 at MFM.  Repeat next week for growth and monitoring of AFI. Twin A a little elevated last week.  May be attributal to GDM. APT to begin at 32 weeks GDM- encouraged to check BS daily so we can assess need for diet or medicine usage; risks to fetuses discussed Unsure about birth control Desires to bottle feed Acyclovir Rx renewed, she takes periodically when she  feels she may be having sx's develop NuSwab to test vaginal discharge; likely cervical mucus Risks of PTL discussed due to twins. Cervix 1 cm today on exam. Urine culture today due to SS Trait  Annamarie Major, MD, [facility], [facility] 10/17/2018  4:41 PM

## 2018-10-17 NOTE — Patient Instructions (Signed)
Contraception Choices Contraception, also called birth control, refers to methods or devices that prevent pregnancy. Hormonal methods Contraceptive implant  A contraceptive implant is a thin, plastic tube that contains a hormone. It is inserted into the upper part of the arm. It can remain in place for up to 3 years. Progestin-only injections Progestin-only injections are injections of progestin, a synthetic form of the hormone progesterone. They are given every 3 months by a health care provider. Birth control pills  Birth control pills are pills that contain hormones that prevent pregnancy. They must be taken once a day, preferably at the same time each day. Birth control patch  The birth control patch contains hormones that prevent pregnancy. It is placed on the skin and must be changed once a week for three weeks and removed on the fourth week. A prescription is needed to use this method of contraception. Vaginal ring  A vaginal ring contains hormones that prevent pregnancy. It is placed in the vagina for three weeks and removed on the fourth week. After that, the process is repeated with a new ring. A prescription is needed to use this method of contraception. Emergency contraceptive Emergency contraceptives prevent pregnancy after unprotected sex. They come in pill form and can be taken up to 5 days after sex. They work best the sooner they are taken after having sex. Most emergency contraceptives are available without a prescription. This method should not be used as your only form of birth control. Barrier methods Female condom  A female condom is a thin sheath that is worn over the penis during sex. Condoms keep sperm from going inside a woman's body. They can be used with a spermicide to increase their effectiveness. They should be disposed after a single use. Female condom  A female condom is a soft, loose-fitting sheath that is put into the vagina before sex. The condom keeps sperm  from going inside a woman's body. They should be disposed after a single use. Diaphragm  A diaphragm is a soft, dome-shaped barrier. It is inserted into the vagina before sex, along with a spermicide. The diaphragm blocks sperm from entering the uterus, and the spermicide kills sperm. A diaphragm should be left in the vagina for 6-8 hours after sex and removed within 24 hours. A diaphragm is prescribed and fitted by a health care provider. A diaphragm should be replaced every 1-2 years, after giving birth, after gaining more than 15 lb (6.8 kg), and after pelvic surgery. Cervical cap  A cervical cap is a round, soft latex or plastic cup that fits over the cervix. It is inserted into the vagina before sex, along with spermicide. It blocks sperm from entering the uterus. The cap should be left in place for 6-8 hours after sex and removed within 48 hours. A cervical cap must be prescribed and fitted by a health care provider. It should be replaced every 2 years. Sponge  A sponge is a soft, circular piece of polyurethane foam with spermicide on it. The sponge helps block sperm from entering the uterus, and the spermicide kills sperm. To use it, you make it wet and then insert it into the vagina. It should be inserted before sex, left in for at least 6 hours after sex, and removed and thrown away within 30 hours. Spermicides Spermicides are chemicals that kill or block sperm from entering the cervix and uterus. They can come as a cream, jelly, suppository, foam, or tablet. A spermicide should be inserted into the   vagina with an applicator at least 10-15 minutes before sex to allow time for it to work. The process must be repeated every time you have sex. Spermicides do not require a prescription. Intrauterine contraception Intrauterine device (IUD) An IUD is a T-shaped device that is put in a woman's uterus. There are two types:  Hormone IUD.This type contains progestin, a synthetic form of the hormone  progesterone. This type can stay in place for 3-5 years.  Copper IUD.This type is wrapped in copper wire. It can stay in place for 10 years.  Permanent methods of contraception Female tubal ligation In this method, a woman's fallopian tubes are sealed, tied, or blocked during surgery to prevent eggs from traveling to the uterus. Hysteroscopic sterilization In this method, a small, flexible insert is placed into each fallopian tube. The inserts cause scar tissue to form in the fallopian tubes and block them, so sperm cannot reach an egg. The procedure takes about 3 months to be effective. Another form of birth control must be used during those 3 months. Female sterilization This is a procedure to tie off the tubes that carry sperm (vasectomy). After the procedure, the man can still ejaculate fluid (semen). Natural planning methods Natural family planning In this method, a couple does not have sex on days when the woman could become pregnant. Calendar method This means keeping track of the length of each menstrual cycle, identifying the days when pregnancy can happen, and not having sex on those days. Ovulation method In this method, a couple avoids sex during ovulation. Symptothermal method This method involves not having sex during ovulation. The woman typically checks for ovulation by watching changes in her temperature and in the consistency of cervical mucus. Post-ovulation method In this method, a couple waits to have sex until after ovulation. Summary  Contraception, also called birth control, means methods or devices that prevent pregnancy.  Hormonal methods of contraception include implants, injections, pills, patches, vaginal rings, and emergency contraceptives.  Barrier methods of contraception can include female condoms, female condoms, diaphragms, cervical caps, sponges, and spermicides.  There are two types of IUDs (intrauterine devices). An IUD can be put in a woman's uterus to  prevent pregnancy for 3-5 years.  Permanent sterilization can be done through a procedure for males, females, or both.  Natural family planning methods involve not having sex on days when the woman could become pregnant. This information is not intended to replace advice given to you by your health care provider. Make sure you discuss any questions you have with your health care provider. Document Released: 12/29/2004 Document Revised: 12/31/2016 Document Reviewed: 02/01/2016 Elsevier Patient Education  2020 Elsevier Inc.  

## 2018-10-18 ENCOUNTER — Telehealth: Payer: Self-pay

## 2018-10-18 NOTE — Telephone Encounter (Signed)
Pt calling; needs okay from [place] to have her wisdom tooth removed - [employer].  [phone]  Called pt; left detailed msg that I needed the fax number and date of appt.  Pt called; fax # is [phone]; appt is Fri 10/9th.  Adv pt our dental protocol was faxed and confirmation received.

## 2018-10-20 ENCOUNTER — Other Ambulatory Visit: Payer: Self-pay

## 2018-10-20 DIAGNOSIS — O30039 Twin pregnancy, monochorionic/diamniotic, unspecified trimester: Secondary | ICD-10-CM

## 2018-10-20 LAB — URINE CULTURE: Organism ID, Bacteria: NO GROWTH

## 2018-10-24 ENCOUNTER — Ambulatory Visit
Admission: RE | Admit: 2018-10-24 | Discharge: 2018-10-24 | Disposition: A | Discharge status: Home / Self Care | Payer: Medicaid Other | Source: Ambulatory Visit | Attending: Obstetrics and Gynecology | Admitting: Obstetrics and Gynecology

## 2018-10-24 ENCOUNTER — Other Ambulatory Visit: Payer: Self-pay

## 2018-10-24 DIAGNOSIS — O30039 Twin pregnancy, monochorionic/diamniotic, unspecified trimester: Secondary | ICD-10-CM | POA: Diagnosis not present

## 2018-10-24 DIAGNOSIS — Z3A30 30 weeks gestation of pregnancy: Secondary | ICD-10-CM | POA: Insufficient documentation

## 2018-10-25 LAB — CERVICOVAGINAL ANCILLARY ONLY
Bacterial Vaginitis (gardnerella): NEGATIVE
Candida Glabrata: NEGATIVE
Candida Vaginitis: NEGATIVE
Chlamydia: NEGATIVE
Comment: NEGATIVE
Comment: NEGATIVE
Comment: NEGATIVE
Comment: NEGATIVE
Neisseria Gonorrhea: NEGATIVE
Trichomonas: NEGATIVE

## 2018-10-25 NOTE — Progress Notes (Signed)
Let  her know no infection found on recent culture

## 2018-10-25 NOTE — Progress Notes (Signed)
Pt aware.

## 2018-10-31 ENCOUNTER — Ambulatory Visit (INDEPENDENT_AMBULATORY_CARE_PROVIDER_SITE_OTHER): Payer: Medicaid Other | Admitting: Obstetrics & Gynecology

## 2018-10-31 ENCOUNTER — Other Ambulatory Visit: Payer: Self-pay

## 2018-10-31 ENCOUNTER — Encounter: Payer: Self-pay | Admitting: Obstetrics & Gynecology

## 2018-10-31 VITALS — BP 100/70 | Wt 186.0 lb

## 2018-10-31 DIAGNOSIS — Z23 Encounter for immunization: Secondary | ICD-10-CM | POA: Diagnosis not present

## 2018-10-31 DIAGNOSIS — O24410 Gestational diabetes mellitus in pregnancy, diet controlled: Secondary | ICD-10-CM

## 2018-10-31 DIAGNOSIS — O30033 Twin pregnancy, monochorionic/diamniotic, third trimester: Secondary | ICD-10-CM

## 2018-10-31 DIAGNOSIS — O24419 Gestational diabetes mellitus in pregnancy, unspecified control: Secondary | ICD-10-CM

## 2018-10-31 DIAGNOSIS — Z3A31 31 weeks gestation of pregnancy: Secondary | ICD-10-CM

## 2018-10-31 DIAGNOSIS — O0993 Supervision of high risk pregnancy, unspecified, third trimester: Secondary | ICD-10-CM

## 2018-10-31 DIAGNOSIS — O30039 Twin pregnancy, monochorionic/diamniotic, unspecified trimester: Secondary | ICD-10-CM

## 2018-10-31 DIAGNOSIS — O0990 Supervision of high risk pregnancy, unspecified, unspecified trimester: Secondary | ICD-10-CM

## 2018-10-31 LAB — POCT URINALYSIS DIPSTICK OB
Glucose, UA: NEGATIVE
POC,PROTEIN,UA: NEGATIVE

## 2018-10-31 NOTE — Progress Notes (Signed)
  Subjective  Fetal Movement? yes Contractions? no Leaking Fluid? no Vaginal Bleeding? no  Objective  BP 100/70   Wt 186 lb (84.4 kg)   LMP 03/27/2018   BMI 35.14 kg/m  General: NAD Pumonary: no increased work of breathing Abdomen: gravid, non-tender Extremities: no edema Psychiatric: mood appropriate, affect full  Assessment  28 y.o. G3P1011 at [redacted]w[redacted]d by  01/01/2019, by Last Menstrual Period presenting for routine prenatal visit  Plan   Problem List Items Addressed This Visit      Other   Supervision of high-risk pregnancy    PNV, [place]   Twin pregnancy, monochorionic/diamniotic, unspecified trimester    [place] weekly now at [facility] on Mondays    APT every Thurs here for NST    PTL precautions discussed   Pregnancy    Relevant Orders   POC Urinalysis Dipstick OB (Completed)   Need for Tdap vaccination        Done today   Gestational diabetes mellitus (GDM) affecting pregnancy, antepartum       BS all WNL although she checks sparingly    Cont to encourage daily checks and healthy diet        Overview     Monochorionic Diamniotic Twin Pregnancy Plan  Mom should take 60-120mg  of elemental iron a day as well as 1mg  of folic acid  85-46 wks: Evaluate for early growth discordance between  16 wks: Begin Ultrasounds for TTTS checks every 2 weeks (monitor MVP* for each twin and visualize the bladder) 18-20 wks:  Anatomy Ultrasound 22 wks: Fetal Echo 20-Delivery: Growth Ultrasounds every 2-4 weeks 32 wks: Initiate NSTs  Delivery No complications: 34 0/7 - 37 6/7 Isolated IUGR: 32 0/7- 34 6/7  Genetic screening offered but declined  Risks: TTTS complicates up to 27-03% of monochorionic pregnancies TAPS complicates 5-0% of monochorionic pregnancies TRAP complicates 1% of monochorionic pregnancies A fetal demise is one twin results in a 40-50% risk of death of neurologic handicap in the surviving twin There is a ninefold increased risk of congenital heart defects, 4-5%.  Normal risk in 0.5%. Perinatal loss and handicap rate is 3-5 times higher than a dichorionic pregnancy  *MVP for either twin > 8cm or < 2cm should result in MFM referral Weekly [place] if issues with MVP arise  **IUGR or Growth discordance > 20% should prompt MFM consultation CALCULATOR: http://[email or web address]       Overview     Clinic [facility] Prenatal Labs  Dating  LMP = 11 wk [place] Blood type: A/Positive/-- (06/26 1530)   Genetic Screen Declines  Antibody:Negative (06/26 1530)  Anatomic US done Rubella: 3.27 (06/26 1530) Varicella: Immune  GTT Early: 187 [x ] 3hr/monitoring     RPR: Non Reactive (06/26 1530)   Rhogam A+ HBsAg: Negative (06/26 1530)   TDaP vaccine        10/19     Flu Shot: HIV: Non Reactive (06/26 1530)   Baby Food          Bottle                      GBS: p  Contraception      Unsure Pap: no record of recent pap  Need Valtrex suppression   CS/VBAC N/a   Support Person                Barnett Applebaum, MD, [place] 10/31/2018  4:41 PM

## 2018-10-31 NOTE — Patient Instructions (Signed)
Nonstress Test °A nonstress test is a procedure that is done during pregnancy in order to check the baby's heartbeat. The procedure can help show if the baby (fetus) is healthy. It is commonly done when: °· The baby is past his or her due date. °· The pregnancy is high risk. °· The baby is moving less than normal. °· The mother has lost a pregnancy in the past. °· The health care provider suspects a problem with the baby's growth. °· There is too much or too little amniotic fluid. °The procedure is often done in the third trimester of pregnancy to find out if an early delivery is needed and whether such a delivery is safe. °During a nonstress test, the baby's heartbeat is monitored when the baby is resting and when the baby is moving. If the baby is healthy, the heart rate will increase when he or she moves or kicks and will return to normal when he or she rests. °Tell a health care provider about: °· Any allergies you have. °· Any medical conditions you have. °· All medicines you are taking, including vitamins, herbs, eye drops, creams, and over-the-counter medicines. °What are the risks? °There are no risks to you or your baby from a nonstress test. This procedure should not be painful or uncomfortable. °What happens before the procedure? °· Eat a meal right before the test or as directed by your health care provider. Food may help encourage the baby to move. °· Use the restroom right before the test. °What happens during the procedure? °· Two monitors will be placed on your abdomen. One will record the baby's heart rate and the other will record the contractions of your uterus. °· You may be asked to lie down on your side or to sit upright. °· You may be given a button to press when you feel your baby move. °· Your health care provider will listen to your baby's heartbeat and recorded it. He or she may also watch your baby's heartbeat on a screen. °· If the baby seems to be sleeping, you may be asked to drink  some juice or soda, eat a snack, or change positions. °The procedure may vary among health care providers and hospitals. °What happens after the procedure? °· Your health care provider will discuss the test results with you and make recommendations for the future. Depending on the results, your health care provider may order additional tests or another course of action. °· If your health care provider gave you any diet or activity instructions, make sure to follow them. °· Keep all follow-up visits as told by your health care provider. This is important. °Summary °· A nonstress test is a procedure that is done during pregnancy in order to check the baby's heartbeat. The procedure can help show if the baby is healthy. °· The procedure is often done in the third trimester of pregnancy to find out if an early delivery is needed and whether such a delivery is safe. °· During a nonstress test, the baby's heartbeat is monitored when the baby is resting and when the baby is moving. If the baby is healthy, the heart rate will increase when he or she moves or kicks and will return to normal when he or she rests. °· Your health care provider will discuss the test results with you and make recommendations for the future. °This information is not intended to replace advice given to you by your health care provider. Make sure you discuss any   questions you have with your health care provider. °Document Released: 12/19/2001 Document Revised: 04/09/2016 Document Reviewed: 04/09/2016 °Elsevier Patient Education © 2020 Elsevier Inc. ° °

## 2018-11-02 ENCOUNTER — Inpatient Hospital Stay
Admission: EM | Admit: 2018-11-02 | Discharge: 2018-11-07 | DRG: 832 | Disposition: A | Discharge status: Home / Self Care | Payer: Medicaid Other | Attending: Certified Nurse Midwife | Admitting: Certified Nurse Midwife

## 2018-11-02 ENCOUNTER — Encounter: Payer: Self-pay | Admitting: *Deleted

## 2018-11-02 ENCOUNTER — Telehealth: Payer: Self-pay

## 2018-11-02 DIAGNOSIS — O99820 Streptococcus B carrier state complicating pregnancy: Secondary | ICD-10-CM | POA: Diagnosis present

## 2018-11-02 DIAGNOSIS — O24419 Gestational diabetes mellitus in pregnancy, unspecified control: Secondary | ICD-10-CM | POA: Diagnosis present

## 2018-11-02 DIAGNOSIS — R21 Rash and other nonspecific skin eruption: Secondary | ICD-10-CM | POA: Diagnosis present

## 2018-11-02 DIAGNOSIS — Z6830 Body mass index (BMI) 30.0-30.9, adult: Secondary | ICD-10-CM

## 2018-11-02 DIAGNOSIS — O98313 Other infections with a predominantly sexual mode of transmission complicating pregnancy, third trimester: Secondary | ICD-10-CM | POA: Diagnosis present

## 2018-11-02 DIAGNOSIS — O4700 False labor before 37 completed weeks of gestation, unspecified trimester: Secondary | ICD-10-CM | POA: Diagnosis present

## 2018-11-02 DIAGNOSIS — O0993 Supervision of high risk pregnancy, unspecified, third trimester: Secondary | ICD-10-CM

## 2018-11-02 DIAGNOSIS — D573 Sickle-cell trait: Secondary | ICD-10-CM | POA: Diagnosis present

## 2018-11-02 DIAGNOSIS — Z20828 Contact with and (suspected) exposure to other viral communicable diseases: Secondary | ICD-10-CM | POA: Diagnosis present

## 2018-11-02 DIAGNOSIS — O99892 Other specified diseases and conditions complicating childbirth: Secondary | ICD-10-CM | POA: Diagnosis present

## 2018-11-02 DIAGNOSIS — Z3A31 31 weeks gestation of pregnancy: Secondary | ICD-10-CM | POA: Diagnosis not present

## 2018-11-02 DIAGNOSIS — Z7982 Long term (current) use of aspirin: Secondary | ICD-10-CM | POA: Diagnosis not present

## 2018-11-02 DIAGNOSIS — A6000 Herpesviral infection of urogenital system, unspecified: Secondary | ICD-10-CM | POA: Diagnosis present

## 2018-11-02 DIAGNOSIS — O99013 Anemia complicating pregnancy, third trimester: Secondary | ICD-10-CM | POA: Diagnosis present

## 2018-11-02 DIAGNOSIS — O30033 Twin pregnancy, monochorionic/diamniotic, third trimester: Secondary | ICD-10-CM | POA: Diagnosis present

## 2018-11-02 DIAGNOSIS — O26893 Other specified pregnancy related conditions, third trimester: Secondary | ICD-10-CM | POA: Diagnosis not present

## 2018-11-02 DIAGNOSIS — O99213 Obesity complicating pregnancy, third trimester: Secondary | ICD-10-CM

## 2018-11-02 DIAGNOSIS — R208 Other disturbances of skin sensation: Secondary | ICD-10-CM | POA: Diagnosis not present

## 2018-11-02 DIAGNOSIS — O30039 Twin pregnancy, monochorionic/diamniotic, unspecified trimester: Secondary | ICD-10-CM

## 2018-11-02 LAB — CBC
HCT: 34.3 % — ABNORMAL LOW (ref 36.0–46.0)
Hemoglobin: 11.2 g/dL — ABNORMAL LOW (ref 12.0–15.0)
MCH: 24.4 pg — ABNORMAL LOW (ref 26.0–34.0)
MCHC: 32.7 g/dL (ref 30.0–36.0)
MCV: 74.7 fL — ABNORMAL LOW (ref 80.0–100.0)
Platelets: 265 10*3/uL (ref 150–400)
RBC: 4.59 MIL/uL (ref 3.87–5.11)
RDW: 17.1 % — ABNORMAL HIGH (ref 11.5–15.5)
WBC: 7.7 10*3/uL (ref 4.0–10.5)
nRBC: 0 % (ref 0.0–0.2)

## 2018-11-02 LAB — CHLAMYDIA/NGC RT PCR (ARMC ONLY)
Chlamydia Tr: NOT DETECTED
N gonorrhoeae: NOT DETECTED

## 2018-11-02 LAB — URINALYSIS, COMPLETE (UACMP) WITH MICROSCOPIC
Bilirubin Urine: NEGATIVE
Glucose, UA: NEGATIVE mg/dL
Hgb urine dipstick: NEGATIVE
Ketones, ur: 5 mg/dL — AB
Nitrite: NEGATIVE
Protein, ur: NEGATIVE mg/dL
Specific Gravity, Urine: 1.015 (ref 1.005–1.030)
pH: 6 (ref 5.0–8.0)

## 2018-11-02 LAB — GLUCOSE, CAPILLARY: Glucose-Capillary: 59 mg/dL — ABNORMAL LOW (ref 70–99)

## 2018-11-02 LAB — TYPE AND SCREEN
ABO/RH(D): A POS
Antibody Screen: NEGATIVE

## 2018-11-02 LAB — GROUP B STREP BY PCR: Group B strep by PCR: POSITIVE — AB

## 2018-11-02 LAB — SARS CORONAVIRUS 2 BY RT PCR (HOSPITAL ORDER, PERFORMED IN ~~LOC~~ HOSPITAL LAB): SARS Coronavirus 2: NEGATIVE

## 2018-11-02 MED ORDER — BETAMETHASONE SOD PHOS & ACET 6 (3-3) MG/ML IJ SUSP
12.0000 mg | INTRAMUSCULAR | Status: AC
  Administered 2018-11-02 – 2018-11-03 (×2): 12 mg via INTRAMUSCULAR

## 2018-11-02 MED ORDER — OXYTOCIN 40 UNITS IN NORMAL SALINE INFUSION - SIMPLE MED: 2.5000 [IU]/h | INTRAVENOUS | Status: DC

## 2018-11-02 MED ORDER — DEXTROSE IN LACTATED RINGERS 5 % IV SOLN
INTRAVENOUS | Status: DC
  Administered 2018-11-03: 03:00:00 via INTRAVENOUS

## 2018-11-02 MED ORDER — MAGNESIUM SULFATE 40 GM/1000ML IV SOLN
INTRAVENOUS | Status: AC
  Administered 2018-11-02: 4 g via INTRAVENOUS
  Filled 2018-11-02: qty 1000

## 2018-11-02 MED ORDER — CLOTRIMAZOLE 1 % EX CREA
TOPICAL_CREAM | Freq: Two times a day (BID) | CUTANEOUS | Status: DC
  Administered 2018-11-02 – 2018-11-07 (×10): via TOPICAL
  Filled 2018-11-02 (×2): qty 15

## 2018-11-02 MED ORDER — OXYTOCIN BOLUS FROM INFUSION: 500.0000 mL | Freq: Once | INTRAVENOUS | Status: DC

## 2018-11-02 MED ORDER — LACTATED RINGERS IV SOLN
500.0000 mL | INTRAVENOUS | Status: DC | PRN
  Administered 2018-11-02: 500 mL via INTRAVENOUS

## 2018-11-02 MED ORDER — MAGNESIUM SULFATE 40 GM/1000ML IV SOLN
2.0000 g/h | INTRAVENOUS | Status: DC
  Administered 2018-11-03: 2 g/h via INTRAVENOUS
  Filled 2018-11-02: qty 1000

## 2018-11-02 MED ORDER — FAMOTIDINE 20 MG PO TABS
20.0000 mg | ORAL_TABLET | Freq: Two times a day (BID) | ORAL | Status: DC | PRN
  Administered 2018-11-03 – 2018-11-06 (×4): 20 mg via ORAL
  Filled 2018-11-02 (×5): qty 1

## 2018-11-02 MED ORDER — PENICILLIN G 3 MILLION UNITS IVPB - SIMPLE MED
3.0000 10*6.[IU] | INTRAVENOUS | Status: DC
  Administered 2018-11-03 (×5): 3 10*6.[IU] via INTRAVENOUS
  Filled 2018-11-02 (×5): qty 100

## 2018-11-02 MED ORDER — VALACYCLOVIR HCL 500 MG PO TABS
500.0000 mg | ORAL_TABLET | Freq: Two times a day (BID) | ORAL | Status: DC
  Administered 2018-11-02 – 2018-11-07 (×10): 500 mg via ORAL
  Filled 2018-11-02 (×11): qty 1

## 2018-11-02 MED ORDER — LACTATED RINGERS IV SOLN
INTRAVENOUS | Status: DC
  Administered 2018-11-02: 21:00:00 via INTRAVENOUS

## 2018-11-02 MED ORDER — BETAMETHASONE SOD PHOS & ACET 6 (3-3) MG/ML IJ SUSP
INTRAMUSCULAR | Status: AC
  Administered 2018-11-02: 12 mg via INTRAMUSCULAR
  Filled 2018-11-02: qty 5

## 2018-11-02 MED ORDER — SODIUM CHLORIDE 0.9 % IV SOLN
5.0000 10*6.[IU] | Freq: Once | INTRAVENOUS | Status: AC
  Administered 2018-11-02: 5 10*6.[IU] via INTRAVENOUS
  Filled 2018-11-02: qty 5

## 2018-11-02 MED ORDER — MAGNESIUM SULFATE BOLUS VIA INFUSION
4.0000 g | Freq: Once | INTRAVENOUS | Status: AC
  Administered 2018-11-02: 21:00:00 4 g via INTRAVENOUS
  Filled 2018-11-02: qty 1000

## 2018-11-02 NOTE — OB Triage Note (Signed)
Presents with complaint of ongoing pain since yesterday in her thighs. States that it is sharp pain and is constant. Denies leaking of fluid or bleeding, but does states that she has some white discharge.  States has had multiple yeast infections during pregnancy.

## 2018-11-02 NOTE — Telephone Encounter (Signed)
Spoke w/patient. Denies vaginal bleeding/ctx, LOF. Advised to remain well hydrated. Rest as much as possible (off feet). Return call if s&s worsen/do not improve. Discuss at 11/07/2018 visit w/DP.

## 2018-11-02 NOTE — Progress Notes (Signed)
L&D progress Note.   S: Hot, frustrated. Feeling some contractions. Hungry.   O: General: quiet, in NAD  FHR: Fetus A 140 baseline with accelerations to 160s, moderate variability Fetus B: 135-140 baseline with accelerations to 150s, moderate variability  Toco: After 4 GM bolus of magnesium sulfate, contractions seemed to space out to every 1-13 min apart.   Cervix: 3/80%/-1   Results for orders placed or performed during the hospital encounter of 11/02/18 (from the past 24 hour(s))  Group B strep by PCR [facility])     Status: Abnormal   Collection Time: 11/02/18  8:32 PM   Specimen: Vaginal/Rectal; Genital  Result Value Ref Range   Group B strep by PCR POSITIVE (A) NEGATIVE  Urinalysis, Complete w Microscopic     Status: Abnormal   Collection Time: 11/02/18  8:32 PM  Result Value Ref Range   Color, Urine YELLOW (A) YELLOW   APPearance CLOUDY (A) CLEAR   Specific Gravity, Urine 1.015 1.005 - 1.030   pH 6.0 5.0 - 8.0   Glucose, UA NEGATIVE NEGATIVE mg/dL   Hgb urine dipstick NEGATIVE NEGATIVE   Bilirubin Urine NEGATIVE NEGATIVE   Ketones, ur 5 (A) NEGATIVE mg/dL   Protein, ur NEGATIVE NEGATIVE mg/dL   Nitrite NEGATIVE NEGATIVE   Leukocytes,Ua TRACE (A) NEGATIVE   RBC / HPF 0-5 0 - 5 RBC/hpf   WBC, UA 6-10 0 - 5 WBC/hpf   Bacteria, UA MANY (A) NONE SEEN   Squamous Epithelial / LPF 11-20 0 - 5   Mucus PRESENT   Type and screen [place]     Status: None   Collection Time: 11/02/18  8:38 PM  Result Value Ref Range   ABO/RH(D) A POS    Antibody Screen NEG    Sample Expiration      11/05/2018,2359 Performed at [facility], [address] [place], [place] [address]   SARS Coronavirus 2 by RT PCR (hospital order, performed in [place] [facility] lab) Nasopharyngeal Vaginal/Rectal     Status: None   Collection Time: 11/02/18  8:38 PM   Specimen: Vaginal/Rectal; Nasopharyngeal  Result Value Ref Range   SARS Coronavirus 2 NEGATIVE NEGATIVE   CBC     Status: Abnormal   Collection Time: 11/02/18  8:40 PM  Result Value Ref Range   WBC 7.7 4.0 - 10.5 K/uL   RBC 4.59 3.87 - 5.11 MIL/uL   Hemoglobin 11.2 (L) 12.0 - 15.0 g/dL   HCT 34.3 (L) 36.0 - 46.0 %   MCV 74.7 (L) 80.0 - 100.0 fL   MCH 24.4 (L) 26.0 - 34.0 pg   MCHC 32.7 30.0 - 36.0 g/dL   RDW 17.1 (H) 11.5 - 15.5 %   Platelets 265 150 - 400 K/uL   nRBC 0.0 0.0 - 0.2 %  Glucose, capillary     Status: Abnormal   Collection Time: 11/02/18  9:40 PM  Result Value Ref Range   Glucose-Capillary 59 (L) 70 - 99 mg/dL   A:  No cervical change over the last 2-2.5 hours. Low blood sugar  P: Continue magnesium sulfate Valtrex 500 mgm BID Lotrimin for rash on panty lines Apple juice to help increase blood sugar Dalia Heading, CNM

## 2018-11-02 NOTE — Telephone Encounter (Signed)
Patient having inner thigh pain. Inquiring if related to pregnancy. Cb#[id number]

## 2018-11-02 NOTE — H&P (Addendum)
OB History & Physical   History of Present Illness:  Chief Complaint:  Complains of sharp pains in her thighs and burning of her skin in her inner thighs since yesterday.  HPI:  Katelyn Mendoza is a 28 y.o. G59P1011 female with EDC=01/01/2019 at 51w3dwith mono-di twins dated by her LMP and confirmed with an 11wk6day ultrasound.  Her pregnancy has been significantly complicated by mono di twins, a history of HSV (takes Acyclovir 200 mgm 2-3x/day on most days), sickle cell trait, a positive Chlamydia test 8/7 (TOC was negative 09/06/18) and presumed gestational diabetes.( early 1 hr GTT was 187).  [facility]   She presents to L&D for evaluation of above complaint.   She denied regular contractions or painful contractions or abdominal pain. Has had a mucoid discharge, but no bleeding. Babies have been active.  Reports that she does not check her CBGs as frequently as she should, but most fasting blood sugars are in the 80s, but some as high as 103. Two hr pp blood sugars are all <120. She has gained 26# with the pregnancy. [facility] Both babies were estimated to weigh 1100 gms or 2#7oz (25th%) Prenatal care site: Prenatal care has been also remarkable for  [facility] Prenatal Labs  Dating  LMP = 11 wk [place] type: A/Positive/-- (06/26 1530)   Genetic Screen Declines  Antibody:Negative (06/26 1530)  Anatomic [place] by DP-anatomy appears normal, posterior placenta, female fetuses Rubella: 3.27 (06/26 1530) Varicella: Immune  GTT Early: 187 [x ] 3hr/monitoring     RPR: Non Reactive (06/26 1530)   Rhogam A+ HBsAg: Negative (06/26 1530)   TDaP vaccine       10/31/18           Flu Shot: HIV: Non Reactive (06/26 1530)   Baby Food          Bottle                      GBS:   Contraception      Unsure Pap: NIL  Need Valtrex suppression   CS/VBAC N/a   Support Person     OB History  Gravida Para Term Preterm AB  Living  '3 1 1 ' 0 1 1  SAB TAB Ectopic Multiple Live Births  0 1 0 0 1    # Outcome Date GA Lbr Len/2nd Weight Sex Delivery Anes PTL Lv  3 Current           2 Term 02/18/15 [id number]:00 / 02:13 3030 g M Vag-Vacuum EPI  LIV     Birth Comments: Terminal MSAF  1 TAB 2008                Maternal Medical History:   Past Medical History:  Diagnosis Date  . Gallstones   . HSV (herpes simplex virus) infection   . Mental disorder    bipolar disorder dx'd at age 28. Sickle cell trait ([place]  . Spinal headache   . Urinary tract infection affecting care of mother, antepartum     Past Surgical History:  Procedure Laterality Date  . NO PAST SURGERIES      No Known Allergies  Prior to Admission medications   Medication Sig Start Date End Date Taking? Authorizing Provider  Accu-Chek FastClix Lancets MISC 1 Units by Percutaneous route 4 (four) times daily. 10/17/18  Yes  Gae Dry, MD  acyclovir (ZOVIRAX) 200 MG capsule Take 1 capsule (200 mg total) by mouth 5 (five) times daily. 10/17/18  Yes Gae Dry, MD  aspirin 81 MG chewable tablet Chew 1 tablet (81 mg total) by mouth daily. 07/04/18  Yes Gatha Mayer, MD  Blood Glucose Monitoring Suppl (ACCU-CHEK NANO SMARTVIEW) w/Device KIT 1 kit by Subdermal route as directed. Check blood sugars for fasting, and two hours after breakfast, lunch and dinner (4 checks daily) 07/22/18  Yes Schuman, Christanna R, MD  glucose blood (ACCU-CHEK SMARTVIEW) test strip Use as instructed to check blood sugars 07/22/18  Yes Schuman, Christanna R, MD  Prenatal Vit-Fe Fumarate-FA (PRENATAL MULTIVITAMIN) TABS tablet Take 1 tablet by mouth daily at 12 noon.   Yes [provider]       Jerene Dilling, PA  ferrous sulfate (FERROUSUL) 325 (65 FE) MG tablet Take 1 tablet (325 mg total) by mouth 2 (two) times daily with a meal. Patient not taking: Reported on 11/02/2018 07/22/18   Homero Fellers, MD      Social History: She   reports that she has never smoked. She has never used smokeless tobacco. She reports that she does not drink alcohol or use drugs.  Family History: family history includes Asthma in her brother and mother; Colon cancer in her maternal grandfather; Diabetes in her mother; Hypertension in her mother.   Review of Systems: Negative x 10 systems reviewed except as noted in the HPI.      Physical Exam:  Vital Signs: BP (!) 94/54   Pulse 85   Temp 98.6 F (37 C) (Oral)   Resp 16   LMP 03/27/2018  General: gravid BF in no acute distress.  HEENT: normocephalic, atraumatic Heart: regular rate & rhythm.  No murmurs/rubs/gallops Lungs: clear to auscultation bilaterally, normal respiratory effort Abdomen: soft, gravid, non-tender; Pelvic:   External: Normal external female genitalia, scaly rash along panty lines bilaterally. No herpetic lesions seen  Cervix: Dilation: 3 / Effacement (%): 80 / Station: -1, 0 / posterior (was 1cm/50% 2 weeks ago)  [employer]: - clue cells; - trichomonas;  - yeast   Extremities: non-tender, symmetric, +1 ankle edema bilaterally.  DTRs: +1  Neurologic: Alert & oriented x 3.    Bedside Ultrasound:  Cephalic/ cephalic presentation  Fetus A on maternal right: baseline 140-145 with accelerations to 160, moderate variability Fetus B on maternal left: 140 with accelerations to 150s to 160, moderate variability Toco: Initially mostly uterine irritability, then began having runs of contractions with spaces in between, then after vaginal exam, contractions every 1-5 minutes with coupling and triplets, palpating mild Assessment:  Katelyn Mendoza is a 28 y.o. G29P1011 female at 4w3dwith threatened preterm labor. GDM Plan:  1. Admit to Labor & Delivery -notified Dr JGlennon Mac He recommended the  following:    1. Magnesium sulfate for tocolysis and neuroprotection 2. GBS PPX (GBS PCR sent) 3. BMZ 4. GC/CHlamydia, urinalysis/urine culture 5. Notify Dr JGlennon Macif  progresses 6. Consult neonatal: Neonatalogy consulted. They are OK with babies delivering here at 314 weeksas long as nurse staffing permits  2. CBC, T&S, IVF, RPR, COvid 19 testing 3. CBGs every 4hours if normal..   4. Consents obtained. 5. Discussed with patient risks of prematurity and  plan to tocolyze patient to try to stop labor/dilation. Discussed method of delivery if she does progress and possibility that she may deliver the presenting fetus vaginally and need a Cesarean section to  deliver Twin B if he is breech. Another option  Is to deliver both twins by Cesarean section.   Dalia Heading  11/02/2018 9:43 PM

## 2018-11-03 ENCOUNTER — Other Ambulatory Visit: Payer: Self-pay

## 2018-11-03 DIAGNOSIS — O30039 Twin pregnancy, monochorionic/diamniotic, unspecified trimester: Secondary | ICD-10-CM

## 2018-11-03 LAB — URINE CULTURE
Culture: <10000 — AB
Special Requests: NORMAL

## 2018-11-03 LAB — GLUCOSE, CAPILLARY
Glucose-Capillary: 90 mg/dL (ref 70–99)
Glucose-Capillary: 111 mg/dL — ABNORMAL HIGH (ref 70–99)
Glucose-Capillary: 149 mg/dL — ABNORMAL HIGH (ref 70–99)
Glucose-Capillary: 106 mg/dL — ABNORMAL HIGH (ref 70–99)
Glucose-Capillary: 136 mg/dL — ABNORMAL HIGH (ref 70–99)
Glucose-Capillary: 105 mg/dL — ABNORMAL HIGH (ref 70–99)

## 2018-11-03 LAB — RPR: RPR Ser Ql: NONREACTIVE

## 2018-11-03 LAB — CBC
HCT: 30.5 % — ABNORMAL LOW (ref 36.0–46.0)
Hemoglobin: 9.9 g/dL — ABNORMAL LOW (ref 12.0–15.0)
MCH: 23.9 pg — ABNORMAL LOW (ref 26.0–34.0)
MCHC: 32.5 g/dL (ref 30.0–36.0)
MCV: 73.5 fL — ABNORMAL LOW (ref 80.0–100.0)
Platelets: 255 10*3/uL (ref 150–400)
RBC: 4.15 MIL/uL (ref 3.87–5.11)
RDW: 17 % — ABNORMAL HIGH (ref 11.5–15.5)
WBC: 10.5 10*3/uL (ref 4.0–10.5)
nRBC: 0 % (ref 0.0–0.2)

## 2018-11-03 LAB — CREATININE, SERUM
Creatinine, Ser: 0.63 mg/dL (ref 0.44–1.00)
GFR calc Af Amer: >60 mL/min (ref >60)
GFR calc non Af Amer: >60 mL/min (ref >60)

## 2018-11-03 MED ORDER — HEPARIN SODIUM (PORCINE) 5000 UNIT/ML IJ SOLN
5000.0000 [IU] | Freq: Three times a day (TID) | INTRAMUSCULAR | Status: DC
  Administered 2018-11-03 – 2018-11-07 (×11): 5000 [IU] via SUBCUTANEOUS
  Filled 2018-11-03 (×13): qty 1

## 2018-11-03 MED ORDER — DOCUSATE SODIUM 100 MG PO CAPS: 100.0000 mg | ORAL_CAPSULE | Freq: Two times a day (BID) | ORAL | Status: DC | PRN

## 2018-11-03 MED ORDER — ALUM & MAG HYDROXIDE-SIMETH 200-200-20 MG/5ML PO SUSP
30.0000 mL | Freq: Once | ORAL | Status: AC | PRN
  Administered 2018-11-03: 01:00:00 30 mL via ORAL

## 2018-11-03 MED ORDER — LACTATED RINGERS IV SOLN
INTRAVENOUS | Status: DC
  Administered 2018-11-03 – 2018-11-06 (×2): via INTRAVENOUS

## 2018-11-03 MED ORDER — TRIAMCINOLONE ACETONIDE 0.1 % EX OINT
TOPICAL_OINTMENT | Freq: Two times a day (BID) | CUTANEOUS | Status: DC
  Administered 2018-11-03 – 2018-11-07 (×9): via TOPICAL
  Filled 2018-11-03 (×2): qty 15

## 2018-11-03 MED ORDER — ALUM & MAG HYDROXIDE-SIMETH 200-200-20 MG/5ML PO SUSP
ORAL | Status: AC
  Administered 2018-11-03: 30 mL via ORAL
  Filled 2018-11-03: qty 30

## 2018-11-03 NOTE — Progress Notes (Signed)
Discussed plan of care with patient and significant other in room. She is feeling well. She denies contractions. She is comfortable. She is tolerating a normal diet. Postprandial blood glucose has been normal today. Patient has a strong preference to remain at [facility] and not be transferred at this time. We discussed mode of delivery. Her preference is for a vaginal delivery. She understands that if baby B was breech there is a risk of a head entrapment especially because of her early gestation. She understands that if Baby b were to turn Breech a cesarean delivery may be necessary.   After her second dose of betamethasone will discontinue magnesium.  Patient can be on q-shift monitoring.  Asked Detta to notify us if she has contractions, pelvic pressure, leakage of fluid or vaginal bleeding.  Will start heparin since patient is sedentary currently.  Plan from MFM thus far is for her to be monitored until at least 32 weeks in the hospital.  Dr. Diamantina Monks planning on seeing the patient today.  Discussed with patient this means that she would likely be inpatient until at least Monday.   Adrian Prows MD [facility] 11/03/2018 3:29 PM

## 2018-11-03 NOTE — Progress Notes (Signed)
MFM 28 yo G3P1011 at 31/3 weeks with mo/di twin gestation, SCT, HSV.  She is doing blood glucose surveillance due to 1hGCT of 187.  [facility] well [facility] [place] [facility], seen briefly on labor and delivery unit today due to admission for preterm labor (3cm dilation)and currently finishing second dose of BMZ and magnesium. She is without c/o ctx but feeling groggy due to mag.  --I discussed with patient plans for her to remain in hospital for observation through weekend and follow up with [place] on Monday for her scheduled ultrasound appointment --it would be ok to discontinue magnesium after her second dose of steroids --if issues arise over the weekend, please do not hesitate to contact [facility] (I will be the MFM attending on call and am familiar with her care).

## 2018-11-03 NOTE — Progress Notes (Signed)
L&D Progress Note  S: Not feeling contractions.Hungry. Slept a little  O: BP 110/64   Pulse 88   Temp 98.2 F (36.8 C) (Oral)   Resp 16   LMP 03/27/2018   SpO2 99%   CBGs<120  FHR: Fetus A: Baseline 120 with accelerations to 140s, moderate variability Fetus B: Baseline 120 with accelerations to 140s, moderate variability  Toco: occasional, prolonged contractions  Cervix: 3/75%/-1  Results for orders placed or performed during the hospital encounter of 11/02/18 (from the past 24 hour(s))  Group B strep by PCR [facility])     Status: Abnormal   Collection Time: 11/02/18  8:32 PM   Specimen: Vaginal/Rectal; Genital  Result Value Ref Range   Group B strep by PCR POSITIVE (A) NEGATIVE  Chlamydia/NGC rt PCR ([facility] only)     Status: None   Collection Time: 11/02/18  8:32 PM   Specimen: Cervical/Vaginal swab; Genital  Result Value Ref Range   Specimen source GC/Chlam URINE, RANDOM    Chlamydia Tr NOT DETECTED NOT DETECTED   N gonorrhoeae NOT DETECTED NOT DETECTED  Urinalysis, Complete w Microscopic     Status: Abnormal   Collection Time: 11/02/18  8:32 PM  Result Value Ref Range   Color, Urine YELLOW (A) YELLOW   APPearance CLOUDY (A) CLEAR   Specific Gravity, Urine 1.015 1.005 - 1.030   pH 6.0 5.0 - 8.0   Glucose, UA NEGATIVE NEGATIVE mg/dL   Hgb urine dipstick NEGATIVE NEGATIVE   Bilirubin Urine NEGATIVE NEGATIVE   Ketones, ur 5 (A) NEGATIVE mg/dL   Protein, ur NEGATIVE NEGATIVE mg/dL   Nitrite NEGATIVE NEGATIVE   Leukocytes,Ua TRACE (A) NEGATIVE   RBC / HPF 0-5 0 - 5 RBC/hpf   WBC, UA 6-10 0 - 5 WBC/hpf   Bacteria, UA MANY (A) NONE SEEN   Squamous Epithelial / LPF 11-20 0 - 5   Mucus PRESENT   Type and screen [facility]     Status: None   Collection Time: 11/02/18  8:38 PM  Result Value Ref Range   ABO/RH(D) A POS    Antibody Screen NEG    Sample Expiration      11/05/2018,2359 Performed at [facility], [address] [place],  [place] [address]   SARS Coronavirus 2 by RT PCR (hospital order, performed in [facility] lab) Nasopharyngeal Vaginal/Rectal     Status: None   Collection Time: 11/02/18  8:38 PM   Specimen: Vaginal/Rectal; Nasopharyngeal  Result Value Ref Range   SARS Coronavirus 2 NEGATIVE NEGATIVE  CBC     Status: Abnormal   Collection Time: 11/02/18  8:40 PM  Result Value Ref Range   WBC 7.7 4.0 - 10.5 K/uL   RBC 4.59 3.87 - 5.11 MIL/uL   Hemoglobin 11.2 (L) 12.0 - 15.0 g/dL   HCT 34.3 (L) 36.0 - 46.0 %   MCV 74.7 (L) 80.0 - 100.0 fL   MCH 24.4 (L) 26.0 - 34.0 pg   MCHC 32.7 30.0 - 36.0 g/dL   RDW 17.1 (H) 11.5 - 15.5 %   Platelets 265 150 - 400 K/uL   nRBC 0.0 0.0 - 0.2 %  Glucose, capillary     Status: Abnormal   Collection Time: 11/02/18  9:40 PM  Result Value Ref Range   Glucose-Capillary 59 (L) 70 - 99 mg/dL  Glucose, capillary     Status: None   Collection Time: 11/03/18  1:29 AM  Result Value Ref Range   Glucose-Capillary  90 70 - 99 mg/dL  Glucose, capillary     Status: Abnormal   Collection Time: 11/03/18  6:11 AM  Result Value Ref Range   Glucose-Capillary 111 (H) 70 - 99 mg/dL   A: No cervical change +GBS P: Continue magnesium sulfate x 24 hours, until receives second BMZ Continue PCN Regular diet Add Kenalog to rash on panty line Decrease IV to 50 cc/hr  Farrel Conners, CNM  Farrel Conners, CNM

## 2018-11-04 LAB — GLUCOSE, CAPILLARY
Glucose-Capillary: 103 mg/dL — ABNORMAL HIGH (ref 70–99)
Glucose-Capillary: 136 mg/dL — ABNORMAL HIGH (ref 70–99)
Glucose-Capillary: 124 mg/dL — ABNORMAL HIGH (ref 70–99)

## 2018-11-04 MED ORDER — CALCIUM CARBONATE ANTACID 500 MG PO CHEW
2.0000 | CHEWABLE_TABLET | Freq: Two times a day (BID) | ORAL | Status: DC
  Administered 2018-11-04 – 2018-11-07 (×6): 400 mg via ORAL
  Filled 2018-11-04 (×6): qty 2

## 2018-11-04 MED ORDER — PANTOPRAZOLE SODIUM 40 MG IV SOLR
40.0000 mg | Freq: Once | INTRAVENOUS | Status: DC
  Filled 2018-11-04: qty 40

## 2018-11-04 NOTE — Progress Notes (Signed)
S: Patient is resting comfortably in bed. She does not feel contractions. She admits positive fetal movement x2. She denies any leakage of fluid or vaginal bleeding. Plan of care has been reviewed with patient.   O: Vital Signs: BP (!) 111/54 (BP Location: Left Arm)   Pulse 88   Temp 98.6 F (37 C) (Oral)   Resp 16   Ht 5\' 1"  (1.549 m)   Wt 85 kg   LMP 03/27/2018   SpO2 96%   BMI 35.41 kg/m  Constitutional: Well nourished, well developed female in no acute distress.  HEENT: normal Skin: Warm and dry.  Cardiovascular: Regular rate and rhythm.   Extremity: no edema  Respiratory: Clear to auscultation bilateral. Normal respiratory effort Abdomen: FHT present Neuro: DTRs 2+, Cranial nerves grossly intact Psych: Alert and Oriented x3. No memory deficits. Normal mood and affect.   Pelvic exam: deferred  Toco: mild to palpation, q 1.5-7.5 minutes, runs of 3 or 4 contractions every 7-8 minutes Fetal well Being:  Fetus A: 130 bpm, moderate variability, +accelerations, -decelerations Fetus B: 125 bpm, moderate variability, +accelerations, -decelerations  Results for MEZTLI, LLANAS (MRN [id number]) as of 11/04/2018 10:15  Ref. Range 11/03/2018 12:37 11/03/2018 16:07 11/03/2018 17:03 11/03/2018 21:51 11/04/2018 07:37  Glucose-Capillary Latest Ref Range: 70 - 99 mg/dL 106 (H) 136 (H)  105 (H) 103 (H)    A: 28 yo G3 P35 female with Mono/di twin IUP at 31 weeks 5 days with threatened pre-term labor, GDM (blood sugar values wnl, infrequent checking)  P: Transfer to Antepartum unit NST Q shift Regular diet- healthy diabetic diet encouraged Continue current orders  Rod Can, CNM

## 2018-11-05 LAB — GLUCOSE, CAPILLARY
Glucose-Capillary: 90 mg/dL (ref 70–99)
Glucose-Capillary: 96 mg/dL (ref 70–99)
Glucose-Capillary: 98 mg/dL (ref 70–99)
Glucose-Capillary: 100 mg/dL — ABNORMAL HIGH (ref 70–99)

## 2018-11-05 MED ORDER — ACETAMINOPHEN 500 MG PO TABS
ORAL_TABLET | ORAL | Status: AC
  Administered 2018-11-05: 1000 mg via ORAL
  Filled 2018-11-05: qty 2

## 2018-11-05 MED ORDER — DIPHENHYDRAMINE HCL 50 MG/ML IJ SOLN
25.0000 mg | Freq: Once | INTRAMUSCULAR | Status: AC
  Administered 2018-11-05: 02:00:00 25 mg via INTRAVENOUS

## 2018-11-05 MED ORDER — ACETAMINOPHEN 500 MG PO TABS
1000.0000 mg | ORAL_TABLET | Freq: Once | ORAL | Status: AC
  Administered 2018-11-05: 02:00:00 1000 mg via ORAL

## 2018-11-05 MED ORDER — DIPHENHYDRAMINE HCL 50 MG/ML IJ SOLN
INTRAMUSCULAR | Status: AC
  Administered 2018-11-05: 25 mg via INTRAVENOUS
  Filled 2018-11-05: qty 1

## 2018-11-05 NOTE — Progress Notes (Signed)
Daily Antepartum Note  Admission Date: 11/02/2018 Current Date: 11/05/2018  Katelyn Mendoza is a 28 y.o. G3P1011 @ [redacted]w[redacted]d by LMP, HD# 3, admitted for threatened preterm labor with monochorionic diamniotic twins.  Pregnancy complicated by: presumed gestational diabetes (1 hour 18, refused 3 hour test) HSV, sickle cell trait.  Patient Active Problem List   Diagnosis Date Noted  . Threatened preterm labor 11/02/2018  . Indication for care in labor and delivery, antepartum 09/20/2018  . Pregnancy 09/20/2018  . Chlamydia trachomatis infection in mother during second trimester of pregnancy 09/06/2018  . Family history of Down syndrome   . Sickle cell trait in mother affecting pregnancy ([place])   . BMI 30.0-30.9,adult 07/08/2018  . Bipolar I disorder ([place]) 07/04/2018  . Gallstones 07/04/2018  . Vulvovaginitis due to yeast 07/04/2018  . Twin pregnancy, monochorionic/diamniotic, unspecified trimester 05/27/2018  . Obesity affecting pregnancy 05/27/2018  . Supervision of high-risk pregnancy 05/10/2018  . 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 Nausea and vomiting during pregnancy 05/10/2018    Subjective:  Feels well this morning. Not having any abdominal pains currently. Was able to get some rest. Tolerating a regular diet. Voiding and ambulating with no issues. Feeling positive fetal movement from both twins.  Objective:   Vitals:   11/04/18 2310 11/05/18 0811  BP: 113/62 110/72  Pulse: 75 80  Resp: 18 18  Temp:  98.8 F (37.1 C)  SpO2: 99% 98%   Temp:  [97.5 F (36.4 C)-99.1 F (37.3 C)] 98.8 F (37.1 C) (10/24 0811) Pulse Rate:  [75-84] 80 (10/24 0811) Resp:  [16-20] 18 (10/24 0811) BP: (106-113)/(55-72) 110/72 (10/24 0811) SpO2:  [98 %-100 %] 98 % (10/24 0811) Temp (24hrs), Avg:98.5 F (36.9 C), Min:97.5 F (36.4 C), Max:99.1 F (37.3 C)   Intake/Output Summary (Last 24 hours) at 11/05/2018 1057 Last data filed at 11/04/2018 2300 Gross per 24 hour  Intake 4798 ml  Output 950 ml   Net 3848 ml     Current Vital Signs 24h Vital Sign Ranges  T 98.8 F (37.1 C) Temp  Avg: 98.5 F (36.9 C)  Min: 97.5 F (36.4 C)  Max: 99.1 F (37.3 C)  BP 110/72 BP  Min: 106/60  Max: 113/62  HR 80 Pulse  Avg: 78.8  Min: 75  Max: 84  RR 18 Resp  Avg: 18  Min: 16  Max: 20  SaO2 98 % Room Air SpO2  Avg: 98.8 %  Min: 98 %  Max: 100 %       24 Hour I/O Current Shift I/O  Time Ins Outs 10/23 0701 - 10/24 0700 In: 9371 [P.O.:1200; I.V.:3598] Out: 950 [Urine:950] No intake/output data recorded.   Patient Vitals for the past 24 hrs:  BP Temp Temp src Pulse Resp SpO2  11/05/18 0811 110/72 98.8 F (37.1 C) Oral 80 18 98 %  11/04/18 2310 113/62 98.6 F (37 C) Oral 75 18 99 %  11/04/18 1950 110/63 99.1 F (37.3 C) Oral 76 20 100 %  11/04/18 1648 106/60 (!) 97.5 F (36.4 C) Oral 79 - 99 %  11/04/18 1101 (!) 110/55 98.4 F (36.9 C) Oral 84 16 98 %    Physical exam: General: Well nourished, well developed female in no acute distress. Abdomen: gravid, non-tender Cardiovascular: Regular rate Respiratory: Normal respiratory effort Extremities: no clubbing, cyanosis or edema Skin: Warm and dry.   Pelvic exam deferred.  Medications: Current Facility-Administered Medications  Medication Dose Route Frequency Provider Last Rate Last Dose  . calcium  carbonate (TUMS - dosed in mg elemental calcium) chewable tablet 400 mg of elemental calcium  2 tablet Oral BID [name], CNM   400 mg of elemental calcium at 11/05/18 1049  . clotrimazole (LOTRIMIN) 1 % cream   Topical BID Farrel Conners, CNM      . docusate sodium (COLACE) capsule 100 mg  100 mg Oral BID PRN Oswaldo Conroy, CNM      . famotidine (PEPCID) tablet 20 mg  20 mg Oral BID PRN Farrel Conners, CNM   20 mg at 11/04/18 1015  . heparin injection 5,000 Units  5,000 Units Subcutaneous Q8H Schuman, Christanna R, MD   5,000 Units at 11/05/18 [phone]  . lactated ringers infusion 500-1,000 mL  500-1,000 mL Intravenous PRN  Farrel Conners, CNM 999 mL/hr at 11/02/18 2327 500 mL at 11/02/18 2327  . lactated ringers infusion   Intravenous Continuous Oswaldo Conroy, CNM 50 mL/hr at 11/03/18 1630    . oxytocin (PITOCIN) IV BOLUS FROM BAG  500 mL Intravenous Once Farrel Conners, CNM      . oxytocin (PITOCIN) IV infusion 40 units in NS 1000 mL - Premix  2.5 Units/hr Intravenous Continuous Farrel Conners, CNM      . pantoprazole (PROTONIX) injection 40 mg  40 mg Intravenous Once [name], CNM      . triamcinolone ointment (KENALOG) 0.1 %   Topical BID Farrel Conners, CNM      . valACYclovir (VALTREX) tablet 500 mg  500 mg Oral BID Farrel Conners, CNM   500 mg at 11/05/18 1051    Labs:  Recent Labs  Lab 11/02/18 2040 11/03/18 1703  WBC 7.7 10.5  HGB 11.2* 9.9*  HCT 34.3* 30.5*  PLT 265 255    Recent Labs  Lab 11/03/18 1703  CREATININE 0.63    Assessment & Plan:  28 year old G3P1011 at 31w 6d with monochorionic diamniotic twins and threatened preterm labor; gestational diabetes  Blood glucose checks fasting and post-prandial. This morning fasting BG was 90. NST every shift, last was reactive for both twins Diabetic diet  Disposition: continue inpatient antepartum care. Plan to follow up Monday with [facility] unless situation changes.  Marcelyn Bruins, CNM 11/05/2018  11:07 AM

## 2018-11-05 NOTE — Progress Notes (Signed)
Patient to [facility] via w/c accompanied by Nicholaus Corolla RN; patient moaning and states her pain is a "10" in lower back "stabbing" and right side of abdomen "cramping" "every 2 minutes"; Rod Can CNM paged and called back and notified of the same; Nicholaus Corolla RN took patient back to [facility]place via w/c accompanied by patient's boyfriend.

## 2018-11-05 NOTE — Progress Notes (Addendum)
Patient to room [facility] from [facility] via w/c accompanied by Cristela Blue RN and boyfriend. Patient denies any pain or cramping at present.

## 2018-11-05 NOTE — Progress Notes (Signed)
To L&D for NST 

## 2018-11-05 NOTE — Progress Notes (Signed)
Brought pt to L&D for NST. Pt states she has recently started feeling some abdominal cramping. No other complaints. EFM applied.

## 2018-11-06 LAB — GLUCOSE, CAPILLARY
Glucose-Capillary: 68 mg/dL — ABNORMAL LOW (ref 70–99)
Glucose-Capillary: 83 mg/dL (ref 70–99)
Glucose-Capillary: 66 mg/dL — ABNORMAL LOW (ref 70–99)
Glucose-Capillary: 74 mg/dL (ref 70–99)
Glucose-Capillary: 92 mg/dL (ref 70–99)
Glucose-Capillary: 104 mg/dL — ABNORMAL HIGH (ref 70–99)

## 2018-11-06 NOTE — Progress Notes (Signed)
Back to room 344 Post NST

## 2018-11-06 NOTE — Progress Notes (Signed)
Katelyn Mendoza notified that Pt. Stated she had," white strands" in urine earlier today. I had Pt void in urine "Hat" and I did see several strands of white mucous. I notified Katelyn Mendoza of this as well; no new orders received. Will cont. To follow closely. FHT were WNL and Pt. States positive Fetal movement and denies contractions; abdomen is soft to palpation.

## 2018-11-06 NOTE — Progress Notes (Addendum)
Pt brought to L&D for NST. Pt denies ctx, LOF, or vaginal bleeding. External monitors applied and assessing. VS WNL. J.Schmid, CNM at bedside and reviewed strip. Pt returned to MB Rm 344. RN made aware.

## 2018-11-06 NOTE — Progress Notes (Signed)
Brought to L&D for NST. Pt states having mild back pain but no other complaints. EFM applied

## 2018-11-06 NOTE — Progress Notes (Signed)
Pt. Has appeared to rest well and has denied c/o. States good Fetal movement and denies vaginal bleeding and/or leakage of Fluid. Fetal Heart Tones were WNL and Pt. Passed NST as Per L&D Rn. Collene Leyden RN. Assessments have been WNL. VSS.

## 2018-11-06 NOTE — Progress Notes (Signed)
Daily Antepartum Note  Admission Date: 11/02/2018 Current Date: 11/06/2018  Katelyn Mendoza is a 28 y.o. G3P1011 @ [redacted]w[redacted]d by LMP, HD# 4, admitted for threatened preterm labor with monochorionic diamniotic twins.  Pregnancy complicated by: presumed gestational diabetes (1 hour 187, refused 3 hour test) HSV, sickle cell trait.  Patient Active Problem List   Diagnosis Date Noted  . Threatened preterm labor 11/02/2018  . Indication for care in labor and delivery, antepartum 09/20/2018  . Pregnancy 09/20/2018  . Chlamydia trachomatis infection in mother during second trimester of pregnancy 09/06/2018  . Family history of Down syndrome   . Sickle cell trait in mother affecting pregnancy (HCC)   . BMI 30.0-30.9,adult 07/08/2018  . Bipolar I disorder (HCC) 07/04/2018  . Gallstones 07/04/2018  . Vulvovaginitis due to yeast 07/04/2018  . Twin pregnancy, monochorionic/diamniotic, unspecified trimester 05/27/2018  . Obesity affecting pregnancy 05/27/2018  . Supervision of high-risk pregnancy 05/10/2018  . 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 Nausea and vomiting during pregnancy 05/10/2018    Subjective:  Does not have any complaints except that her blood sugars have been running low despite eating regular meals. Encouraged snacks between meals. No contractions, loss of fluid, or vaginal bleeding. Both babies are moving well.  Objective:   Vitals:   11/06/18 0749 11/06/18 1204  BP: 107/65 108/62  Pulse: 90 70  Resp: 18 18  Temp: 98.6 F (37 C) 97.9 F (36.6 C)  SpO2: 100% 99%   Temp:  [97.9 F (36.6 C)-98.6 F (37 C)] 97.9 F (36.6 C) (10/25 1204) Pulse Rate:  [70-91] 70 (10/25 1204) Resp:  [18-20] 18 (10/25 1204) BP: (107-126)/(62-77) 108/62 (10/25 1204) SpO2:  [99 %-100 %] 99 % (10/25 1204) Temp (24hrs), Avg:98.3 F (36.8 C), Min:97.9 F (36.6 C), Max:98.6 F (37 C)   Intake/Output Summary (Last 24 hours) at 11/06/2018 1257 Last data filed at 11/06/2018 0548 Gross per 24 hour   Intake 1753.87 ml  Output -  Net 1753.87 ml     Current Vital Signs 24h Vital Sign Ranges  T 97.9 F (36.6 C) Temp  Avg: 98.3 F (36.8 C)  Min: 97.9 F (36.6 C)  Max: 98.6 F (37 C)  BP 108/62 BP  Min: 107/65  Max: 126/71  HR 70 Pulse  Avg: 79.4  Min: 70  Max: 91  RR 18 Resp  Avg: 19.2  Min: 18  Max: 20  SaO2 99 % Room Air SpO2  Avg: 99.5 %  Min: 99 %  Max: 100 %       24 Hour I/O Current Shift I/O  Time Ins Outs 10/24 0701 - 10/25 0700 In: 1753.9 [I.V.:1753.9] Out: -  No intake/output data recorded.   Patient Vitals for the past 24 hrs:  BP Temp Temp src Pulse Resp SpO2  11/06/18 1204 108/62 97.9 F (36.6 C) Oral 70 18 99 %  11/06/18 0749 107/65 98.6 F (37 C) Oral 90 18 100 %  11/06/18 0458 119/74 98.4 F (36.9 C) Oral 91 20 99 %  11/05/18 1945 122/77 98 F (36.7 C) Oral 71 20 100 %  11/05/18 1521 126/71 98.6 F (37 C) Oral 75 20 -    Physical exam: General: Well nourished, well developed female in no acute distress. Abdomen: gravid, non-tender Cardiovascular: Regular rate Respiratory: Normal respiratory effort Extremities: no clubbing, cyanosis or edema Skin: Warm and dry.   Pelvic exam deferred.  Medications: Current Facility-Administered Medications  Medication Dose Route Frequency Provider Last Rate Last Dose  . calcium  carbonate (TUMS - dosed in mg elemental calcium) chewable tablet 400 mg of elemental calcium  2 tablet Oral BID Rod Can, CNM   400 mg of elemental calcium at 11/06/18 1105  . clotrimazole (LOTRIMIN) 1 % cream   Topical BID Dalia Heading, CNM      . docusate sodium (COLACE) capsule 100 mg  100 mg Oral BID PRN Rexene Agent, CNM      . famotidine (PEPCID) tablet 20 mg  20 mg Oral BID PRN Dalia Heading, CNM   20 mg at 11/06/18 0806  . heparin injection 5,000 Units  5,000 Units Subcutaneous Q8H Schuman, Christanna R, MD   5,000 Units at 11/06/18 [phone]  . lactated ringers infusion 500-1,000 mL  500-1,000 mL Intravenous PRN  Dalia Heading, CNM 999 mL/hr at 11/02/18 2327 500 mL at 11/02/18 2327  . lactated ringers infusion   Intravenous Continuous Rexene Agent, CNM   Stopped at 11/06/18 1131  . oxytocin (PITOCIN) IV BOLUS FROM BAG  500 mL Intravenous Once Dalia Heading, CNM      . oxytocin (PITOCIN) IV infusion 40 units in NS 1000 mL - Premix  2.5 Units/hr Intravenous Continuous Dalia Heading, CNM      . pantoprazole (PROTONIX) injection 40 mg  40 mg Intravenous Once Rod Can, CNM      . triamcinolone ointment (KENALOG) 0.1 %   Topical BID Dalia Heading, CNM      . valACYclovir (VALTREX) tablet 500 mg  500 mg Oral BID Dalia Heading, CNM   500 mg at 11/06/18 1105    Labs:  Recent Labs  Lab 11/02/18 2040 11/03/18 1703  WBC 7.7 10.5  HGB 11.2* 9.9*  HCT 34.3* 30.5*  PLT 265 255    Recent Labs  Lab 11/03/18 1703  CREATININE 0.63    Assessment & Plan:  28 year old G3P1011 at [place] 6d with monochorionic diamniotic twins and threatened preterm labor; gestational diabetes  Blood glucose checks fasting and post-prandial.  NST every shift, last was reactive for both twins Regular diet  Disposition: Continue inpatient antepartum care. Plan is to follow up Monday for an appointment with [facility] unless her situation changes.  Avel Sensor, CNM 11/06/2018  12:57 PM

## 2018-11-07 ENCOUNTER — Inpatient Hospital Stay
Admission: RE | Admit: 2018-11-07 | Discharge: 2018-11-07 | Disposition: A | Discharge status: Home / Self Care | Payer: Medicaid Other | Source: Ambulatory Visit | Attending: Obstetrics and Gynecology | Admitting: Obstetrics and Gynecology

## 2018-11-07 VITALS — BP 101/77 | HR 86 | Temp 97.8°F | Resp 20 | Ht 61.0 in | Wt 187.5 lb

## 2018-11-07 DIAGNOSIS — O365990 Maternal care for other known or suspected poor fetal growth, unspecified trimester, not applicable or unspecified: Secondary | ICD-10-CM | POA: Insufficient documentation

## 2018-11-07 DIAGNOSIS — O4703 False labor before 37 completed weeks of gestation, third trimester: Secondary | ICD-10-CM

## 2018-11-07 DIAGNOSIS — O30039 Twin pregnancy, monochorionic/diamniotic, unspecified trimester: Secondary | ICD-10-CM

## 2018-11-07 LAB — GLUCOSE, CAPILLARY: Glucose-Capillary: 71 mg/dL (ref 70–99)

## 2018-11-07 MED ORDER — TERCONAZOLE 0.8 % VA CREA: 1.0000 | TOPICAL_CREAM | Freq: Every day | VAGINAL | 0 refills | Status: DC

## 2018-11-07 NOTE — Progress Notes (Signed)
Pt discharged with infant. Discharge instructions, prescriptions, and follow up appointments given to and reviewed with patient. Pt verbalized understanding. Escorted out by staff. 

## 2018-11-07 NOTE — Progress Notes (Signed)
Provider reviewed Reactive NST results and rechecked patient's cervix. Cervix was unchanged from previous exams. Patient transported back to [facility] and RN informed that provider will put in D/C orders.

## 2018-11-07 NOTE — Discharge Summary (Signed)
Physician Final Progress Note  Patient ID: Katelyn Mendoza MRN: [id number] DOB/AGE: Jan 20, 1990 28 y.o.  Admit date: 11/02/2018 Admitting provider: Will Bonnet, MD Discharge date: 11/07/2018   Admission Diagnoses: threatened preterm labor  Discharge Diagnoses:  Active Problems:   Threatened preterm labor Mono/Di IUP at 32 weeks 1 day Reactive NST x2 No further cervical change  History of Present Illness: The patient is a 28 y.o. female G3P1011 at 45w1dwho presents for sharp pain in her thighs and feeling of skin burning in inner thighs. She denied contractions on admission. Irregular contractions were seen on NSTs while inpatient.Cervical change was noted on admission from 2 weeks ago. Cervical dilation stable while inpatient with no further change on discharge. Patient reports good fetal movement x2. [facility] and was found to have growth discrepancy. Fetus A now at 40% and Fetus B at 7%. Please see DP ultrasound report.  NSTs have been reactive throughout the 5 day stay. Patient is having vaginal itching. Rx Terconazole sent for patient. [facility] Patient is discharged to home and to be out of work for 2 weeks per Dr LLehman Prom   Past Medical History:  Diagnosis Date  . Gallstones   . HSV (herpes simplex virus) infection   . Mental disorder    bipolar disorder dx'd at age 28 . [place] ([place]   . Spinal headache   . Urinary tract infection affecting care of mother, antepartum     Past Surgical History:  Procedure Laterality Date  . NO PAST SURGERIES      No current facility-administered medications on file prior to encounter.    Current Outpatient Medications on File Prior to Encounter  Medication Sig Dispense Refill  . Accu-Chek FastClix Lancets MISC 1 Units by Percutaneous route 4 (four) times daily. 100 each 12  . acyclovir (ZOVIRAX) 200 MG capsule Take 1 capsule (200 mg  total) by mouth 5 (five) times daily. 25 capsule 5  . aspirin 81 MG chewable tablet Chew 1 tablet (81 mg total) by mouth daily. 90 tablet 3  . Blood Glucose Monitoring Suppl (ACCU-CHEK NANO SMARTVIEW) w/Device KIT 1 kit by Subdermal route as directed. Check blood sugars for fasting, and two hours after breakfast, lunch and dinner (4 checks daily) 1 kit 0  . glucose blood (ACCU-CHEK SMARTVIEW) test strip Use as instructed to check blood sugars 100 each 12  . Prenatal Vit-Fe Fumarate-FA (PRENATAL MULTIVITAMIN) TABS tablet Take 1 tablet by mouth daily at 12 noon.    . ferrous sulfate (FERROUSUL) 325 (65 FE) MG tablet Take 1 tablet (325 mg total) by mouth 2 (two) times daily with a meal. 60 tablet 11    No Known Allergies  Social History   Socioeconomic History  . Marital status: Single    Spouse name: Not on file  . Number of children: Not on file  . Years of education: Not on file  . Highest education level: Not on file  Occupational History  . Not on file  Social Needs  . Financial resource strain: Not on file  . Food insecurity    Worry: Patient refused    Inability: Patient refused  . Transportation needs    Medical: Not on file    Non-medical: Not on file  Tobacco Use  . Smoking status: Never Smoker  . Smokeless tobacco: Never Used  Substance and Sexual Activity  . Alcohol use:  No  . Drug use: No  . Sexual activity: Yes  Lifestyle  . Physical activity    Days per week: Patient refused    Minutes per session: Patient refused  . Stress: Not on file  Relationships  . Social Herbalist on phone: Patient refused    Gets together: Patient refused    Attends religious service: Patient refused    Active member of club or organization: Patient refused    Attends meetings of clubs or organizations: Patient refused    Relationship status: Patient refused  . Intimate partner violence    Fear of current or ex partner: Not on file    Emotionally abused: Not on file     Physically abused: Not on file    Forced sexual activity: Not on file  Other Topics Concern  . Not on file  Social History Narrative  . Not on file    Family History  Problem Relation Age of Onset  . Diabetes Mother   . Hypertension Mother   . Asthma Mother   . Asthma Brother   . Colon cancer Maternal Grandfather      Review of Systems  Constitutional: Negative.   HENT: Negative.   Eyes: Negative.   Respiratory: Negative.   Cardiovascular: Negative.   Gastrointestinal: Negative.   Genitourinary:       Vaginal itching  Musculoskeletal: Negative.   Skin: Negative.   Neurological: Negative.   Endo/Heme/Allergies: Negative.   Psychiatric/Behavioral: Negative.      Physical Exam: BP 104/82 (BP Location: Left Arm)   Pulse 90   Temp 97.8 F (36.6 C) (Oral)   Resp 18   Ht _0  (1.549 m)   Wt 85 kg   LMP 03/27/2018   SpO2 99%   BMI 35.41 kg/m   Constitutional: Well nourished, well developed female in no acute distress.  HEENT: normal Skin: Warm and dry.  Cardiovascular: Regular rate and rhythm.   Extremity: no edema  Respiratory: Clear to auscultation bilateral. Normal respiratory effort Abdomen: FHT present Back: no CVAT Neuro: DTRs 2+, Cranial nerves grossly intact Psych: Alert and Oriented x3. No memory deficits. Normal mood and affect.  MS: normal gait, normal bilateral lower extremity ROM/strength/stability.  Pelvic exam: (female chaperone present) is not limited by body habitus EGBUS: within normal limits Vagina: within normal limits and with normal mucosa  Cervix: 3/70/-1  Toco: occasional mild contraction Fetal well being:  Fetus A: 140 bpm baseline, moderate variability, + 15x15 accelerations, -decelerations Fetus B: 135 bpm baseline, moderate variability, + 15x15 accelerations, -decelerations    Consults: [facility] REPORT                       (Signed Final 11/07/2018 10:44  am) ---------------------------------------------------------------------- PATIENT INFO:  ID #:       [id number]                          D.O.B.:  Mar 31, 1990 (27 yrs)  Name:       Katelyn Mendoza                  Visit Date: 11/07/2018 10:13 am ---------------------------------------------------------------------- PERFORMED BY:  Performed By:     Christena Deem RDMS        Referred By:      Stefanie Libel  SCHUMAN ---------------------------------------------------------------------- SERVICE(S) PROVIDED:   [place] MFM OB FOLLOW UP ADDL GEST                        [id number]  ---------------------------------------------------------------------- INDICATIONS:   [redacted] weeks gestation of pregnancy                Z3A.32  ---------------------------------------------------------------------- FETAL EVALUATION (FETUS A):  Num Of Fetuses:         2  Fetal Heart Rate(bpm):  141  Cardiac Activity:       Present  Fetal Lie:              Maternal Right  Presentation:           Vertex  Placenta:               Posterior  AFI Sum(cm)     %Tile       Largest Pocket(cm)  3.47            < 3         3.47  RUQ(cm)  3.47  Comment:    Mvp=3.47 ---------------------------------------------------------------------- BIOMETRY (FETUS A):  BPD:      80.9  mm     G. Age:  32w 3d         52  %    CI:        76.84   %    70 - 86                                                          FL/HC:      21.3   %    19.1 - 21.3  HC:      292.3  mm     G. Age:  32w 2d         18  %    HC/AC:      1.05        0.96 - 1.17  AC:      278.5  mm     G. Age:  32w 0d         42  %    FL/BPD:     77.0   %    71 - 87  FL:       62.3  mm     G. Age:  32w 2d         41  %    FL/AC:      22.4   %    20 - 24  HUM:        55  mm     G. Age:  32w 0d         48  %  Est. FW:    1902  gm      4 lb 3 oz     40  %     FW Discordancy     0 \ 18  % ---------------------------------------------------------------------- OB HISTORY:  Gravidity:    3  Living:       1 ---------------------------------------------------------------------- GESTATIONAL AGE (FETUS A):  LMP:           32w 1d        Date:  03/27/18  EDD:   01/01/19  U/S Today:     32w 2d                                        EDD:   12/31/18  Best:          32w 1d     Det. By:  LMP  (03/27/18)          EDD:   01/01/19 ---------------------------------------------------------------------- ANATOMY (FETUS A):  Heart:                 4-Chamber view         Stomach:                Seen                         appears normal  RVOT:                  Normal appearance      Kidneys:                Normal appearance  LVOT:                  Normal appearance      Bladder:                Seen ---------------------------------------------------------------------- DOPPLER - FETAL VESSELS (FETUS A):  Umbilical Artery   S/D     %tile   2.4       34 ---------------------------------------------------------------------- FETAL EVALUATION (FETUS B):  Num Of Fetuses:         2  Fetal Heart Rate(bpm):  149  Cardiac Activity:       Present  Fetal Lie:              Maternal Left  Presentation:           Vertex  Placenta:               Posterior  AFI Sum(cm)     %Tile       Largest Pocket(cm)  4.44            < 3         4.44  RUQ(cm)  4.44  Comment:    MVP = 4.4 ---------------------------------------------------------------------- BIOMETRY (FETUS B):  BPD:      72.6  mm     G. Age:  29w 1d        < 1  %    CI:        70.61   %    70 - 86                                                          FL/HC:      21.2   %    19.1 - 21.3  HC:      275.4  mm     G. Age:  30w 1d        < 3  %    HC/AC:      1.04        0.96 - 1.17  AC:  265.2  mm     G. Age:  30w 4d         12  %    FL/BPD:     80.3   %    71 - 87  FL:       58.3  mm     G. Age:  30w 3d          7  %    FL/AC:       22.0   %    20 - 24  HUM:      55.2  mm     G. Age:  32w 1d         51  %  Est. FW:    1568  gm      3 lb 7 oz      7  %     FW Discordancy        18  % ---------------------------------------------------------------------- GESTATIONAL AGE (FETUS B):  LMP:           32w 1d        Date:  03/27/18                 EDD:   01/01/19  U/S Today:     30w 1d                                        EDD:   01/15/19  Best:          32w 1d     Det. By:  LMP  (03/27/18)          EDD:   01/01/19 ---------------------------------------------------------------------- ANATOMY (FETUS B):  Cavum:                 Within Normal Limits   Stomach:                Seen  Ventricles:            Normal appearance      Kidneys:                Normal appearance  Heart:                 4-Chamber view         Bladder:                Seen                         appears normal  LVOT:                  Normal appearance ---------------------------------------------------------------------- DOPPLER - FETAL VESSELS (FETUS B):  Umbilical Artery   S/D     %tile   2.6       46 ---------------------------------------------------------------------- IMPRESSION:  Dear Dr Gilman Schmidt  ,  Thank you for referring your patient for ultrasound for twin  growth evaluation due to [place] twinning recently admitted for  preterm labor .  Patient  is curretnly at 59 w 1 d , Dating is by LMP consistent  with earliest u/s on 06/14/18 done at [facility] on 11w 6d  A Mono Di twin pregnancy is again identified.  A thin dividing membrane is once again identified.  Twin A is located on the maternal   right   side (single  posterior placenta).  Twin B is located on the maternal     left  side (single posterior  placenta).  Twin A is cephalic ( note head is deep in pelvis) .  Twin B is cephalic. Marland Kitchen  Twin A is 1902 g ( 40th percentile )  Twin B is 1568 g (7th percentile- note AC is 11th , HC is  pulling the measurements down- head anatomy is  normal)  Intertwin discrepancy is 18%  The amniotic fluid is within normal limits in each sac.  The MVP for Twin A is   3.5 cm.  Doppler s/d ratio 2.4  The MVP for Twin B is  4.4  cm. doppler s/d ratio 2.6  Weekly doppler BPP ordered  Follow-up ultrasound is suggested approximately every 3  weeks to evaluate the fetal growth.  Thank you for allowing [place] to participate in your patient's care.  Please do not hesitate to contact us with any questions. ----------------------------------------------------------------------              Gatha Mayer, MD Electronically Signed Final Report   11/07/2018 10:44 am   Significant Findings/ Diagnostic Studies: labs:  Fasting and post prandial blood sugar levels within normal  Procedures: NST BID x 4 days  Hospital Course: The patient was admitted to Labor and Delivery Triage for observation.   Discharge Condition: good  Disposition: Discharge disposition: 01-Home or Self Care       Diet: Healthy diabetic diet  Discharge Activity: Activity as tolerated and Ambulate in house  Discharge Instructions    Discharge activity:  No Restrictions   Complete by: As directed    Discharge diet:  No restrictions   Complete by: As directed    Fetal Kick Count:  Lie on our left side for one hour after a meal, and count the number of times your baby kicks.  If it is less than 5 times, get up, move around and drink some juice.  Repeat the test 30 minutes later.  If it is still less than 5 kicks in an hour, notify your doctor.   Complete by: As directed    No sexual activity restrictions   Complete by: As directed    Notify physician for a general feeling that "something is not right"   Complete by: As directed    Notify physician for increase or change in vaginal discharge   Complete by: As directed    Notify physician for intestinal cramps, with or without diarrhea, sometimes described as "gas pain"   Complete by: As directed    Notify  physician for leaking of fluid   Complete by: As directed    Notify physician for low, dull backache, unrelieved by heat or Tylenol   Complete by: As directed    Notify physician for menstrual like cramps   Complete by: As directed    Notify physician for pelvic pressure   Complete by: As directed    Notify physician for uterine contractions.  These may be painless and feel like the uterus is tightening or the baby is  "balling up"   Complete by: As directed    Notify physician for vaginal bleeding   Complete by: As directed    PRETERM LABOR:  Includes any of the follwing symptoms that occur between 20 - [redacted] weeks gestation.  If these symptoms are not stopped, preterm labor can result in preterm delivery, placing your baby at risk   Complete by: As directed  Allergies as of 11/07/2018   No Known Allergies     Medication List    TAKE these medications   Accu-Chek FastClix Lancets Misc 1 Units by Percutaneous route 4 (four) times daily.   Accu-Chek Nano SmartView w/Device Kit 1 kit by Subdermal route as directed. Check blood sugars for fasting, and two hours after breakfast, lunch and dinner (4 checks daily)   Accu-Chek SmartView test strip Generic drug: glucose blood Use as instructed to check blood sugars   acyclovir 200 MG capsule Commonly known as: Zovirax Take 1 capsule (200 mg total) by mouth 5 (five) times daily.   aspirin 81 MG chewable tablet Chew 1 tablet (81 mg total) by mouth daily.   ferrous sulfate 325 (65 FE) MG tablet Commonly known as: FerrouSul Take 1 tablet (325 mg total) by mouth 2 (two) times daily with a meal.   prenatal multivitamin Tabs tablet Take 1 tablet by mouth daily at 12 noon.   terconazole 0.8 % vaginal cream Commonly known as: TERAZOL 3 Place 1 applicator vaginally at bedtime.      Randall. Schedule an appointment as soon as possible for a visit.   Specialty: Obstetrics and  Gynecology Why: for routine prenatal appointment Contact information: [address] [place] [address] [phone]          Total time spent taking care of this patient: 25 minutes  Signed: Rod Can, CNM  11/07/2018, 12:27 PM

## 2018-11-07 NOTE — Progress Notes (Signed)
Pt brought to OBS 3 for BID NST.

## 2018-11-07 NOTE — ED Notes (Signed)
Pt to [facility] for scheduled appt this AM via wheelchair from inpatient Mother/Baby.

## 2018-11-09 ENCOUNTER — Telehealth: Payer: Self-pay

## 2018-11-09 ENCOUNTER — Other Ambulatory Visit: Payer: Self-pay | Admitting: Maternal Newborn

## 2018-11-09 DIAGNOSIS — O26893 Other specified pregnancy related conditions, third trimester: Secondary | ICD-10-CM

## 2018-11-09 MED ORDER — OMEPRAZOLE 20 MG PO CPDR: 20.0000 mg | DELAYED_RELEASE_CAPSULE | Freq: Every day | ORAL | 3 refills | Status: DC

## 2018-11-09 NOTE — Telephone Encounter (Signed)
Patient aware.

## 2018-11-09 NOTE — Telephone Encounter (Signed)
Patient reports having really bad heartburn that tums isn't touching. States her chest is inflammed. Requesting rx. Cb#[phone]

## 2018-11-09 NOTE — Telephone Encounter (Signed)
Rx for omeprazole sent to [facility], thanks.

## 2018-11-10 ENCOUNTER — Other Ambulatory Visit: Payer: Self-pay

## 2018-11-10 DIAGNOSIS — O30039 Twin pregnancy, monochorionic/diamniotic, unspecified trimester: Secondary | ICD-10-CM

## 2018-11-14 ENCOUNTER — Other Ambulatory Visit: Payer: Medicaid Other

## 2018-11-14 ENCOUNTER — Telehealth: Payer: Self-pay

## 2018-11-14 ENCOUNTER — Ambulatory Visit
Admission: RE | Admit: 2018-11-14 | Discharge: 2018-11-14 | Disposition: A | Discharge status: Home / Self Care | Payer: Medicaid Other | Source: Ambulatory Visit | Attending: Obstetrics and Gynecology | Admitting: Obstetrics and Gynecology

## 2018-11-14 ENCOUNTER — Other Ambulatory Visit: Payer: Self-pay

## 2018-11-14 DIAGNOSIS — O4703 False labor before 37 completed weeks of gestation, third trimester: Secondary | ICD-10-CM

## 2018-11-14 DIAGNOSIS — O365930 Maternal care for other known or suspected poor fetal growth, third trimester, not applicable or unspecified: Secondary | ICD-10-CM | POA: Diagnosis not present

## 2018-11-14 DIAGNOSIS — O30039 Twin pregnancy, monochorionic/diamniotic, unspecified trimester: Secondary | ICD-10-CM

## 2018-11-14 DIAGNOSIS — O30033 Twin pregnancy, monochorionic/diamniotic, third trimester: Secondary | ICD-10-CM | POA: Insufficient documentation

## 2018-11-14 DIAGNOSIS — Z3A33 33 weeks gestation of pregnancy: Secondary | ICD-10-CM | POA: Insufficient documentation

## 2018-11-14 DIAGNOSIS — O365990 Maternal care for other known or suspected poor fetal growth, unspecified trimester, not applicable or unspecified: Secondary | ICD-10-CM

## 2018-11-14 LAB — GLUCOSE, CAPILLARY: Glucose-Capillary: 120 mg/dL — ABNORMAL HIGH (ref 70–99)

## 2018-11-14 NOTE — ED Notes (Signed)
Pt states that she experienced some bleeding on 11/12/18 and did not call her OBGYN or go to the ED.  Pt scheduled for MFM [place] today at [facility].  Encouraged pt to call MD or go to the ER if that ever occurs again.  Dr. Zack Seal notified of pt report today.

## 2018-11-14 NOTE — Telephone Encounter (Signed)
Pt calling; has question about a little bit of vaginal bleeding she had but has now gone away.  [facility] is concerned.  [phone]  Pt states bleeding started late Sat pm and into Sun am.  It was brown Sun am.  She did have IC within 24hrs before bleeding started.  Adv probably from [facility].  Pt has appt Wed.

## 2018-11-16 ENCOUNTER — Ambulatory Visit (INDEPENDENT_AMBULATORY_CARE_PROVIDER_SITE_OTHER): Payer: Medicaid Other | Admitting: Certified Nurse Midwife

## 2018-11-16 ENCOUNTER — Observation Stay
Admission: EM | Admit: 2018-11-16 | Discharge: 2018-11-16 | Disposition: A | Discharge status: Home / Self Care | Payer: Medicaid Other | Attending: Maternal Newborn | Admitting: Maternal Newborn

## 2018-11-16 ENCOUNTER — Other Ambulatory Visit: Payer: Self-pay

## 2018-11-16 VITALS — BP 110/60 | Wt 178.0 lb

## 2018-11-16 DIAGNOSIS — O0993 Supervision of high risk pregnancy, unspecified, third trimester: Secondary | ICD-10-CM

## 2018-11-16 DIAGNOSIS — O26853 Spotting complicating pregnancy, third trimester: Secondary | ICD-10-CM

## 2018-11-16 DIAGNOSIS — O30003 Twin pregnancy, unspecified number of placenta and unspecified number of amniotic sacs, third trimester: Secondary | ICD-10-CM | POA: Insufficient documentation

## 2018-11-16 DIAGNOSIS — Z3A33 33 weeks gestation of pregnancy: Secondary | ICD-10-CM

## 2018-11-16 DIAGNOSIS — O288 Other abnormal findings on antenatal screening of mother: Secondary | ICD-10-CM

## 2018-11-16 DIAGNOSIS — O30033 Twin pregnancy, monochorionic/diamniotic, third trimester: Secondary | ICD-10-CM

## 2018-11-16 DIAGNOSIS — R7309 Other abnormal glucose: Secondary | ICD-10-CM | POA: Diagnosis not present

## 2018-11-16 DIAGNOSIS — O30039 Twin pregnancy, monochorionic/diamniotic, unspecified trimester: Secondary | ICD-10-CM

## 2018-11-16 DIAGNOSIS — O368330 Maternal care for abnormalities of the fetal heart rate or rhythm, third trimester, not applicable or unspecified: Secondary | ICD-10-CM

## 2018-11-16 DIAGNOSIS — O368390 Maternal care for abnormalities of the fetal heart rate or rhythm, unspecified trimester, not applicable or unspecified: Secondary | ICD-10-CM

## 2018-11-16 DIAGNOSIS — Z7982 Long term (current) use of aspirin: Secondary | ICD-10-CM | POA: Insufficient documentation

## 2018-11-16 DIAGNOSIS — O26859 Spotting complicating pregnancy, unspecified trimester: Secondary | ICD-10-CM

## 2018-11-16 DIAGNOSIS — O99213 Obesity complicating pregnancy, third trimester: Secondary | ICD-10-CM

## 2018-11-16 DIAGNOSIS — Z6830 Body mass index (BMI) 30.0-30.9, adult: Secondary | ICD-10-CM

## 2018-11-16 LAB — POCT URINALYSIS DIPSTICK OB
Glucose, UA: NEGATIVE
POC,PROTEIN,UA: NEGATIVE

## 2018-11-16 LAB — GLUCOSE, POCT (MANUAL RESULT ENTRY): POC Glucose: 78 mg/dl (ref 70–99)

## 2018-11-16 MED ORDER — VALACYCLOVIR HCL 500 MG PO TABS: 500.0000 mg | ORAL_TABLET | Freq: Two times a day (BID) | ORAL | 1 refills | Status: DC

## 2018-11-16 NOTE — Progress Notes (Signed)
D/C instructions reviewed with pt. Instructed pt to stay out of work per the note from the CNM given to her today and to notify her provider if she notices any vaginal bleeding, LOF, ctx, and decreased fetal movement. Pt verbalized understanding and was d/c via wheelchair to ER.

## 2018-11-16 NOTE — Progress Notes (Signed)
C/o DP took her out of work for two weeks - not sure if will continue to be out; had a little vag bleeding Sat night. rj

## 2018-11-16 NOTE — OB Triage Note (Signed)
Pt G3P1 [redacted]w[redacted]d presents to L&D for monitoring from the office. Pt denies ctx, LOF, and reports + FM on both babies. Monitors applied and assessing. CNM notified of arrival.

## 2018-11-16 NOTE — Progress Notes (Signed)
HROB/ 33wk3day mono di twins: Reports having some bleeding after IC on Sat 31 Oct. No bleeding today. Denies contractions. Has been eating OK. Denies nausea and vomiting. Has lost 8# in the last 2 weeks. Has 2 loose stools daily. Babies are active. Taking Acyclovir twice a day but only taking 200 mgm BID. No symptoms of an outbreak. Seen by DP on 11/14/2018: Twin A was on maternal right/ cephalic. BPP was 8/8 and Dopplers were normal. EFW 40% on 10/26 4#3oz Twin B was on maternal left/ cephalic and BPP was 8/8 with normal Dopplers. Twin B was growth restricted. EFW 7% on  10/26 3#7oz  Exam today: FH 45cm. Contraction palpated initially when she lie down. FSBS=78 after eating apples this AM FHT on Twin A 133 FHT twin B 137 then in the 90s, then back to 130s. Baby moving when listening to  FHR  Cervix: 3.5/75%/-1 (was 3cm/70% last week)  Will send to L&D for NST due to possible FHR deceleration of Twin B Change Acyclovir to Valtrex 500 mgm BID Follow up at [facility] on 9 November as scheduled if reactive NST Recommend taking out of work until 6 weeks postpartum  Dalia Heading, CNM  Dalia Heading, [place]

## 2018-11-16 NOTE — Discharge Summary (Signed)
Physician Final Progress Note  Patient ID: JAKI STEPTOE MRN: [id number] DOB/AGE: January 18, 1990 28 y.o.  Admit date: 11/16/2018 Admitting provider: Adrian Prows, MD Discharge date: 11/16/2018   Admission Diagnoses: Fetal non-stress test  Discharge Diagnoses: Reactive fetal non-stress test   History of Present Illness: The patient is a 28 y.o. female G73P1011 at 69w3dwho presents from clinic today for a fetal non-stress test and observation due to a questionable fetal heart rate deceleration to the 90s in twin B during Doppler monitoring.  She has felt good fetal movement from both twins. She has not had any loss of fluid. She had spotting on 10/31 following intercourse, and some brown discharge the next day. No further spotting or vaginal bleeding since then. She is not feeling contractions.  Review of Systems: Review of systems negative unless otherwise noted in HPI.   Past Medical History:  Diagnosis Date  . Gallstones   . HSV (herpes simplex virus) infection   . Mental disorder    bipolar disorder dx'd at age 28 . Sickle cell trait ([place]   . Spinal headache   . Urinary tract infection affecting care of mother, antepartum     Past Surgical History:  Procedure Laterality Date  . NO PAST SURGERIES      No current facility-administered medications on file prior to encounter.    Current Outpatient Medications on File Prior to Encounter  Medication Sig Dispense Refill  . acyclovir (ZOVIRAX) 200 MG capsule Take 1 capsule (200 mg total) by mouth 5 (five) times daily. 25 capsule 5  . aspirin 81 MG chewable tablet Chew 1 tablet (81 mg total) by mouth daily. 90 tablet 3  . Prenatal Vit-Fe Fumarate-FA (PRENATAL MULTIVITAMIN) TABS tablet Take 1 tablet by mouth daily at 12 noon.    . valACYclovir (VALTREX) 500 MG tablet Take 1 tablet (500 mg total) by mouth 2 (two) times daily. 60 tablet 1  . Accu-Chek FastClix Lancets MISC 1 Units by Percutaneous route 4 (four) times daily.  (Patient not taking: Reported on 11/16/2018) 100 each 12  . Blood Glucose Monitoring Suppl (ACCU-CHEK NANO SMARTVIEW) w/Device KIT 1 kit by Subdermal route as directed. Check blood sugars for fasting, and two hours after breakfast, lunch and dinner (4 checks daily) (Patient not taking: Reported on 11/16/2018) 1 kit 0  . ferrous sulfate (FERROUSUL) 325 (65 FE) MG tablet Take 1 tablet (325 mg total) by mouth 2 (two) times daily with a meal. (Patient not taking: Reported on 11/16/2018) 60 tablet 11  . glucose blood (ACCU-CHEK SMARTVIEW) test strip Use as instructed to check blood sugars (Patient not taking: Reported on 11/16/2018) 100 each 12  . omeprazole (PRILOSEC) 20 MG capsule Take 1 capsule (20 mg total) by mouth daily. (Patient not taking: Reported on 11/16/2018) 30 capsule 3  . terconazole (TERAZOL 3) 0.8 % vaginal cream Place 1 applicator vaginally at bedtime. (Patient not taking: Reported on 11/16/2018) 20 g 0    No Known Allergies  Social History   Socioeconomic History  . Marital status: Single    Spouse name: Not on file  . Number of children: Not on file  . Years of education: Not on file  . Highest education level: Not on file  Occupational History  . Not on file  Social Needs  . Financial resource strain: Not on file  . Food insecurity    Worry: Patient refused    Inability: Patient refused  . Transportation needs    Medical: No  Non-medical: No  Tobacco Use  . Smoking status: Never Smoker  . Smokeless tobacco: Never Used  Substance and Sexual Activity  . Alcohol use: No  . Drug use: No  . Sexual activity: Yes  Lifestyle  . Physical activity    Days per week: Patient refused    Minutes per session: Patient refused  . Stress: Not at all  Relationships  . Social Herbalist on phone: Patient refused    Gets together: Patient refused    Attends religious service: Patient refused    Active member of club or organization: Patient refused    Attends meetings  of clubs or organizations: Patient refused    Relationship status: Patient refused  . Intimate partner violence    Fear of current or ex partner: No    Emotionally abused: No    Physically abused: No    Forced sexual activity: No  Other Topics Concern  . Not on file  Social History Narrative  . Not on file    Family history:  Family History  Problem Relation Age of Onset  . Diabetes Mother   . Hypertension Mother   . Asthma Mother   . Asthma Brother   . Colon cancer Maternal Grandfather      Physical Exam: BP 104/65 (BP Location: Right Arm)   Pulse 97   Temp 98.4 F (36.9 C) (Oral)   Resp 16   Ht _0  (1.549 m)   Wt 80.7 kg   LMP 03/27/2018   SpO2 99%   BMI 33.63 kg/m   Gen: NAD Pulm: No increased work of breathing Abdomen: gravid, non-tender Pelvic: deferred Ext: No signs of DVT  NST Twin A: Baseline: 145 bpm Variability: moderate Accelerations: present Decelerations: absent Tocometry: occasional contractions and uterine irritability The patient was monitored for 30+ minutes, fetal heart rate tracing was deemed reactive.  Twin B: Baseline: 135 bpm Variability: moderate Accelerations: present Decelerations: absent Tocometry: occasional contractions and uterine irritability The patient was monitored for 30+ minutes, fetal heart rate tracing was deemed reactive.  Hospital Course: It was initially difficult to pick up both twin A and twin B on the fetal tracing. After I located both twins via bedside ultrasound and confirmed normal heart rates, the monitors were placed so that each twin's tracing was present on EFM. A reactive NST was completed for both twins. There were occasional contractions and some uterine irritability of the tocometer. The patient had not been able to feel any of this activity. However, at the conclusion of the NST, she noted that she had felt one contraction in her lower abdomen similar to a menstrual cramp. Therefore, she was monitored  for an additional hour. She did not have regular contractions and she did not feel any additional uterine activity.  Therefore, she was discharged with precautions.  Procedures: NST  Discharge Condition: good  Disposition: Discharge disposition: 01-Home or Self Care       Diet: Regular diet  Discharge Activity: Activity as tolerated  Discharge Instructions    Discharge activity:  No Restrictions   Complete by: As directed    Discharge diet:  No restrictions   Complete by: As directed    No sexual activity restrictions   Complete by: As directed    Notify physician for a general feeling that "something is not right"   Complete by: As directed    Notify physician for increase or change in vaginal discharge   Complete by: As directed  Notify physician for intestinal cramps, with or without diarrhea, sometimes described as "gas pain"   Complete by: As directed    Notify physician for leaking of fluid   Complete by: As directed    Notify physician for low, dull backache, unrelieved by heat or Tylenol   Complete by: As directed    Notify physician for menstrual like cramps   Complete by: As directed    Notify physician for pelvic pressure   Complete by: As directed    Notify physician for uterine contractions.  These may be painless and feel like the uterus is tightening or the baby is  "balling up"   Complete by: As directed    Notify physician for vaginal bleeding   Complete by: As directed    PRETERM LABOR:  Includes any of the follwing symptoms that occur between 20 - [redacted] weeks gestation.  If these symptoms are not stopped, preterm labor can result in preterm delivery, placing your baby at risk   Complete by: As directed      Allergies as of 11/16/2018   No Known Allergies     Medication List    STOP taking these medications   acyclovir 200 MG capsule Commonly known as: Zovirax     TAKE these medications   Accu-Chek FastClix Lancets Misc 1 Units by  Percutaneous route 4 (four) times daily.   Accu-Chek Nano SmartView w/Device Kit 1 kit by Subdermal route as directed. Check blood sugars for fasting, and two hours after breakfast, lunch and dinner (4 checks daily)   Accu-Chek SmartView test strip Generic drug: glucose blood Use as instructed to check blood sugars   aspirin 81 MG chewable tablet Chew 1 tablet (81 mg total) by mouth daily.   ferrous sulfate 325 (65 FE) MG tablet Commonly known as: FerrouSul Take 1 tablet (325 mg total) by mouth 2 (two) times daily with a meal.   omeprazole 20 MG capsule Commonly known as: PRILOSEC Take 1 capsule (20 mg total) by mouth daily.   prenatal multivitamin Tabs tablet Take 1 tablet by mouth daily at 12 noon.   terconazole 0.8 % vaginal cream Commonly known as: TERAZOL 3 Place 1 applicator vaginally at bedtime.   valACYclovir 500 MG tablet Commonly known as: Valtrex Take 1 tablet (500 mg total) by mouth 2 (two) times daily.       Follow up appointments as previously scheduled at [facility]. Note given to remain out of work for the remainder of pregnancy.  Signed: Rexene Agent, CNM  11/16/2018, 1:05 PM

## 2018-11-17 ENCOUNTER — Other Ambulatory Visit: Payer: Self-pay

## 2018-11-17 DIAGNOSIS — O30039 Twin pregnancy, monochorionic/diamniotic, unspecified trimester: Secondary | ICD-10-CM

## 2018-11-21 ENCOUNTER — Other Ambulatory Visit: Payer: Self-pay

## 2018-11-21 ENCOUNTER — Ambulatory Visit
Admission: RE | Admit: 2018-11-21 | Discharge: 2018-11-21 | Disposition: A | Discharge status: Home / Self Care | Payer: Medicaid Other | Source: Ambulatory Visit | Attending: Maternal & Fetal Medicine | Admitting: Maternal & Fetal Medicine

## 2018-11-21 ENCOUNTER — Other Ambulatory Visit: Payer: Medicaid Other

## 2018-11-21 DIAGNOSIS — Z3A34 34 weeks gestation of pregnancy: Secondary | ICD-10-CM | POA: Diagnosis not present

## 2018-11-21 DIAGNOSIS — O30033 Twin pregnancy, monochorionic/diamniotic, third trimester: Secondary | ICD-10-CM | POA: Insufficient documentation

## 2018-11-21 DIAGNOSIS — O30039 Twin pregnancy, monochorionic/diamniotic, unspecified trimester: Secondary | ICD-10-CM

## 2018-11-24 ENCOUNTER — Other Ambulatory Visit: Payer: Self-pay

## 2018-11-24 ENCOUNTER — Ambulatory Visit (INDEPENDENT_AMBULATORY_CARE_PROVIDER_SITE_OTHER): Payer: Medicaid Other | Admitting: Advanced Practice Midwife

## 2018-11-24 ENCOUNTER — Other Ambulatory Visit: Payer: Self-pay | Admitting: Obstetrics and Gynecology

## 2018-11-24 ENCOUNTER — Encounter: Payer: Self-pay | Admitting: Advanced Practice Midwife

## 2018-11-24 VITALS — BP 122/74 | Wt 190.0 lb

## 2018-11-24 DIAGNOSIS — O30039 Twin pregnancy, monochorionic/diamniotic, unspecified trimester: Secondary | ICD-10-CM

## 2018-11-24 DIAGNOSIS — Z3A34 34 weeks gestation of pregnancy: Secondary | ICD-10-CM

## 2018-11-24 DIAGNOSIS — O30033 Twin pregnancy, monochorionic/diamniotic, third trimester: Secondary | ICD-10-CM

## 2018-11-24 DIAGNOSIS — O0993 Supervision of high risk pregnancy, unspecified, third trimester: Secondary | ICD-10-CM

## 2018-11-24 NOTE — Progress Notes (Signed)
Routine Prenatal Care Visit  Subjective  Katelyn Mendoza is a 28 y.o. G3P1011 at [redacted]w[redacted]d being seen today for ongoing prenatal care.  She is currently monitored for the following issues for this high-risk pregnancy and has Supervision of high-risk pregnancy; HSV infection; Nausea and vomiting during pregnancy; Twin pregnancy, monochorionic/diamniotic, unspecified trimester; Obesity affecting pregnancy; Bipolar I disorder (HCC); Gallstones; Vulvovaginitis due to yeast; BMI 30.0-30.9,adult; Family history of Down syndrome; Sickle cell trait in mother affecting pregnancy (HCC); Chlamydia trachomatis infection in mother during second trimester of pregnancy; Indication for care in labor and delivery, antepartum; Pregnancy; Threatened preterm labor; and Pregnancy affected by fetal growth restriction on their problem list.  ----------------------------------------------------------------------------------- Patient reports back pain and general discomfort.  She reports some mucous discharge.  Contractions: Not present. Vag. Bleeding: None.  Movement: Present. Leaking Fluid denies.  ----------------------------------------------------------------------------------- The following portions of the patient's history were reviewed and updated as appropriate: allergies, current medications, past family history, past medical history, past social history, past surgical history and problem list. Problem list updated.  Objective  Blood pressure 122/74, weight 190 lb (86.2 kg), last menstrual period 03/27/2018 Pregravid weight 160 lb (72.6 kg) Total Weight Gain 30 lb (13.6 kg) Urinalysis: Urine Protein    Urine Glucose    Fetal Status: Fetal Heart Rate (bpm): 135/145 Fundal Height: 51 cm Movement: Present     General:  Alert, oriented and cooperative. Patient is in no acute distress.   Skin: Skin is warm and dry. No rash noted.   Cardiovascular: Normal heart rate noted  Respiratory: Normal respiratory effort, no  problems with respiration noted  Abdomen: Soft, gravid, appropriate for gestational age. Pain/Pressure: Present     Pelvic:  Cervical exam deferred        Extremities: Normal range of motion.  Edema: None  Mental Status: Appears either very tired or under the influence. Normal judgment and thought content.   Assessment   28 y.o. G3P1011 at [redacted]w[redacted]d by  01/01/2019, by Last Menstrual Period presenting for routine prenatal visit  Plan   pregnancy3 Problems (from 03/27/18 to present)    Problem Noted Resolved   BMI 30.0-30.9,adult 07/08/2018 by Conard Novak, MD No   Twin pregnancy, monochorionic/diamniotic, unspecified trimester 05/27/2018 by [name], CNM No   Overview Signed 06/15/2018  9:15 PM by Natale Milch, MD    Monochorionic Diamniotic Twin Pregnancy Plan  Mom should take 60-120mg  of elemental iron a day as well as 1mg  of folic acid  11-14 wks: Evaluate for early growth discordance between  16 wks: Begin Ultrasounds for TTTS checks every 2 weeks (monitor MVP* for each twin and visualize the bladder) 18-20 wks:  Anatomy Ultrasound 22 wks: Fetal Echo 20-Delivery: Growth Ultrasounds every 2-4 weeks** 32 wks: Initiate NSTs  Delivery No complications: 34 0/7 - 37 6/7 Isolated IUGR: 32 0/7- 34 6/7  Genetic screening offered with first trimester screen (11-14 wks) or CVS sampling (10-12 wks)  Risks: TTTS complicates up to 10-15% of monochorionic pregnancies TAPS complicates 3-5% of monochorionic pregnancies TRAP complicates 1% of monochorionic pregnancies A fetal demise is one twin results in a 40-50% risk of death of neurologic handicap in the surviving twin There is a ninefold increased risk of congenital heart defects, 4-5%. Normal risk in 0.5%. Perinatal loss and handicap rate is 3-5 times higher than a dichorionic pregnancy  *MVP for either twin > 8cm or < 2cm should result in MFM referral Weekly 8/7 if issues with MVP arise  **IUGR or Growth  discordance >  20% should prompt MFM consultation CALCULATOR: http://[email or web address].[email or web address]/[email or web address]       Obesity affecting pregnancy 05/27/2018 by Rod Can, CNM No   Supervision of high-risk pregnancy 05/10/2018 by Gae Dry, MD No   Overview Addendum 10/17/2018  4:37 PM by Gae Dry, MD    Clinic [facility] Prenatal Labs  Dating  LMP = 11 wk [place] Blood type: A/Positive/-- (06/26 1530)   Genetic Screen Declines  Antibody:Negative (06/26 1530)  Anatomic US done Rubella: 3.27 (06/26 1530) Varicella: Immune  GTT Early: 76 [x ] 3hr/monitoring     RPR: Non Reactive (06/26 1530)   Rhogam A+ HBsAg: Negative (06/26 1530)   TDaP vaccine                  Flu Shot: HIV: Non Reactive (06/26 1530)   Baby Food          Bottle                      GBS:   Contraception      Unsure Pap: no record of recent pap  Need Valtrex suppression   CS/VBAC N/a   Support Person                Preterm labor symptoms and general obstetric precautions including but not limited to vaginal bleeding, contractions, leaking of fluid and fetal movement were reviewed in detail with the patient. Has f/u with DP scheduled.  Return in about 1 week (around 12/01/2018) for rob.  Rod Can, CNM 11/24/2018 1:52 PM

## 2018-11-24 NOTE — Progress Notes (Signed)
Left leg pain, and back pain.

## 2018-11-28 ENCOUNTER — Other Ambulatory Visit: Payer: Medicaid Other

## 2018-11-28 ENCOUNTER — Ambulatory Visit
Admission: RE | Admit: 2018-11-28 | Discharge: 2018-11-28 | Disposition: A | Discharge status: Home / Self Care | Payer: Medicaid Other | Source: Ambulatory Visit | Attending: Obstetrics and Gynecology | Admitting: Obstetrics and Gynecology

## 2018-11-28 ENCOUNTER — Other Ambulatory Visit: Payer: Self-pay

## 2018-11-28 DIAGNOSIS — O30039 Twin pregnancy, monochorionic/diamniotic, unspecified trimester: Secondary | ICD-10-CM | POA: Diagnosis not present

## 2018-11-28 DIAGNOSIS — Z3A35 35 weeks gestation of pregnancy: Secondary | ICD-10-CM | POA: Insufficient documentation

## 2018-11-28 LAB — GLUCOSE, CAPILLARY: Glucose-Capillary: 63 mg/dL — ABNORMAL LOW (ref 70–99)

## 2018-11-29 LAB — URINE DRUG PANEL 7
Amphetamines, Urine: NEGATIVE ng/mL
Barbiturate Quant, Ur: NEGATIVE ng/mL
Benzodiazepine Quant, Ur: NEGATIVE ng/mL
Cannabinoid Quant, Ur: NEGATIVE
Cocaine (Metab.): NEGATIVE ng/mL
Opiate Quant, Ur: NEGATIVE ng/mL
PCP Quant, Ur: NEGATIVE ng/mL

## 2018-12-01 ENCOUNTER — Other Ambulatory Visit (HOSPITAL_COMMUNITY)
Admission: RE | Admit: 2018-12-01 | Discharge: 2018-12-01 | Disposition: A | Discharge status: Home / Self Care | Payer: Medicaid Other | Source: Ambulatory Visit | Attending: Advanced Practice Midwife | Admitting: Advanced Practice Midwife

## 2018-12-01 ENCOUNTER — Encounter: Payer: Self-pay | Admitting: Maternal Newborn

## 2018-12-01 ENCOUNTER — Other Ambulatory Visit: Payer: Self-pay

## 2018-12-01 ENCOUNTER — Ambulatory Visit (INDEPENDENT_AMBULATORY_CARE_PROVIDER_SITE_OTHER): Payer: Medicaid Other | Admitting: Maternal Newborn

## 2018-12-01 VITALS — BP 108/70 | Wt 195.0 lb

## 2018-12-01 DIAGNOSIS — O30039 Twin pregnancy, monochorionic/diamniotic, unspecified trimester: Secondary | ICD-10-CM

## 2018-12-01 DIAGNOSIS — Z3A35 35 weeks gestation of pregnancy: Secondary | ICD-10-CM

## 2018-12-01 DIAGNOSIS — O0993 Supervision of high risk pregnancy, unspecified, third trimester: Secondary | ICD-10-CM | POA: Insufficient documentation

## 2018-12-01 DIAGNOSIS — O30033 Twin pregnancy, monochorionic/diamniotic, third trimester: Secondary | ICD-10-CM

## 2018-12-01 NOTE — Patient Instructions (Signed)
Third Trimester of Pregnancy The third trimester is from week 28 through week 40 (months 7 through 9). The third trimester is a time when the unborn baby (fetus) is growing rapidly. At the end of the ninth month, the fetus is about 20 inches in length and weighs 6-10 pounds. Body changes during your third trimester Your body will continue to go through many changes during pregnancy. The changes vary from woman to woman. During the third trimester:  Your weight will continue to increase. You can expect to gain 25-35 pounds (11-16 kg) by the end of the pregnancy.  You may begin to get stretch marks on your hips, abdomen, and breasts.  You may urinate more often because the fetus is moving lower into your pelvis and pressing on your bladder.  You may develop or continue to have heartburn. This is caused by increased hormones that slow down muscles in the digestive tract.  You may develop or continue to have constipation because increased hormones slow digestion and cause the muscles that push waste through your intestines to relax.  You may develop hemorrhoids. These are swollen veins (varicose veins) in the rectum that can itch or be painful.  You may develop swollen, bulging veins (varicose veins) in your legs.  You may have increased body aches in the pelvis, back, or thighs. This is due to weight gain and increased hormones that are relaxing your joints.  You may have changes in your hair. These can include thickening of your hair, rapid growth, and changes in texture. Some women also have hair loss during or after pregnancy, or hair that feels dry or thin. Your hair will most likely return to normal after your baby is born.  Your breasts will continue to grow and they will continue to become tender. A yellow fluid (colostrum) may leak from your breasts. This is the first milk you are producing for your baby.  Your belly button may stick out.  You may notice more swelling in your hands,  face, or ankles.  You may have increased tingling or numbness in your hands, arms, and legs. The skin on your belly may also feel numb.  You may feel short of breath because of your expanding uterus.  You may have more problems sleeping. This can be caused by the size of your belly, increased need to urinate, and an increase in your body's metabolism.  You may notice the fetus "dropping," or moving lower in your abdomen (lightening).  You may have increased vaginal discharge.  You may notice your joints feel loose and you may have pain around your pelvic bone. What to expect at prenatal visits You will have prenatal exams every 2 weeks until week 36. Then you will have weekly prenatal exams. During a routine prenatal visit:  You will be weighed to make sure you and the baby are growing normally.  Your blood pressure will be taken.  Your abdomen will be measured to track your baby's growth.  The fetal heartbeat will be listened to.  Any test results from the previous visit will be discussed.  You may have a cervical check near your due date to see if your cervix has softened or thinned (effaced).  You will be tested for Group B streptococcus. This happens between 35 and 37 weeks. Your health care provider may ask you:  What your birth plan is.  How you are feeling.  If you are feeling the baby move.  If you have had any abnormal   symptoms, such as leaking fluid, bleeding, severe headaches, or abdominal cramping.  If you are using any tobacco products, including cigarettes, chewing tobacco, and electronic cigarettes.  If you have any questions. Other tests or screenings that may be performed during your third trimester include:  Blood tests that check for low iron levels (anemia).  Fetal testing to check the health, activity level, and growth of the fetus. Testing is done if you have certain medical conditions or if there are problems during the pregnancy.  Nonstress test  (NST). This test checks the health of your baby to make sure there are no signs of problems, such as the baby not getting enough oxygen. During this test, a belt is placed around your belly. The baby is made to move, and its heart rate is monitored during movement. What is false labor? False labor is a condition in which you feel small, irregular tightenings of the muscles in the womb (contractions) that usually go away with rest, changing position, or drinking water. These are called Braxton Hicks contractions. Contractions may last for hours, days, or even weeks before true labor sets in. If contractions come at regular intervals, become more frequent, increase in intensity, or become painful, you should see your health care provider. What are the signs of labor?  Abdominal cramps.  Regular contractions that start at 10 minutes apart and become stronger and more frequent with time.  Contractions that start on the top of the uterus and spread down to the lower abdomen and back.  Increased pelvic pressure and dull back pain.  A watery or bloody mucus discharge that comes from the vagina.  Leaking of amniotic fluid. This is also known as your "water breaking." It could be a slow trickle or a gush. Let your health care provider know if it has a color or strange odor. If you have any of these signs, call your health care provider right away, even if it is before your due date. Follow these instructions at home: Medicines  Follow your health care provider's instructions regarding medicine use. Specific medicines may be either safe or unsafe to take during pregnancy.  Take a prenatal vitamin that contains at least 600 micrograms (mcg) of folic acid.  If you develop constipation, try taking a stool softener if your health care provider approves. Eating and drinking   Eat a balanced diet that includes fresh fruits and vegetables, whole grains, good sources of protein such as meat, eggs, or tofu,  and low-fat dairy. Your health care provider will help you determine the amount of weight gain that is right for you.  Avoid raw meat and uncooked cheese. These carry germs that can cause birth defects in the baby.  If you have low calcium intake from food, talk to your health care provider about whether you should take a daily calcium supplement.  Eat four or five small meals rather than three large meals a day.  Limit foods that are high in fat and processed sugars, such as fried and sweet foods.  To prevent constipation: ? Drink enough fluid to keep your urine clear or pale yellow. ? Eat foods that are high in fiber, such as fresh fruits and vegetables, whole grains, and beans. Activity  Exercise only as directed by your health care provider. Most women can continue their usual exercise routine during pregnancy. Try to exercise for 30 minutes at least 5 days a week. Stop exercising if you experience uterine contractions.  Avoid heavy lifting.  Do   not exercise in extreme heat or humidity, or at high altitudes.  Wear low-heel, comfortable shoes.  Practice good posture.  You may continue to have sex unless your health care provider tells you otherwise. Relieving pain and discomfort  Take frequent breaks and rest with your legs elevated if you have leg cramps or low back pain.  Take warm sitz baths to soothe any pain or discomfort caused by hemorrhoids. Use hemorrhoid cream if your health care provider approves.  Wear a good support bra to prevent discomfort from breast tenderness.  If you develop varicose veins: ? Wear support pantyhose or compression stockings as told by your healthcare provider. ? Elevate your feet for 15 minutes, 3-4 times a day. Prenatal care  Write down your questions. Take them to your prenatal visits.  Keep all your prenatal visits as told by your health care provider. This is important. Safety  Wear your seat belt at all times when driving.  Make  a list of emergency phone numbers, including numbers for family, friends, the hospital, and police and fire departments. General instructions  Avoid cat litter boxes and soil used by cats. These carry germs that can cause birth defects in the baby. If you have a cat, ask someone to clean the litter box for you.  Do not travel far distances unless it is absolutely necessary and only with the approval of your health care provider.  Do not use hot tubs, steam rooms, or saunas.  Do not drink alcohol.  Do not use any products that contain nicotine or tobacco, such as cigarettes and e-cigarettes. If you need help quitting, ask your health care provider.  Do not use any medicinal herbs or unprescribed drugs. These chemicals affect the formation and growth of the baby.  Do not douche or use tampons or scented sanitary pads.  Do not cross your legs for long periods of time.  To prepare for the arrival of your baby: ? Take prenatal classes to understand, practice, and ask questions about labor and delivery. ? Make a trial run to the hospital. ? Visit the hospital and tour the maternity area. ? Arrange for maternity or paternity leave through employers. ? Arrange for family and friends to take care of pets while you are in the hospital. ? Purchase a rear-facing car seat and make sure you know how to install it in your car. ? Pack your hospital bag. ? Prepare the baby's nursery. Make sure to remove all pillows and stuffed animals from the baby's crib to prevent suffocation.  Visit your dentist if you have not gone during your pregnancy. Use a soft toothbrush to brush your teeth and be gentle when you floss. Contact a health care provider if:  You are unsure if you are in labor or if your water has broken.  You become dizzy.  You have mild pelvic cramps, pelvic pressure, or nagging pain in your abdominal area.  You have lower back pain.  You have persistent nausea, vomiting, or diarrhea.   You have an unusual or bad smelling vaginal discharge.  You have pain when you urinate. Get help right away if:  Your water breaks before 37 weeks.  You have regular contractions less than 5 minutes apart before 37 weeks.  You have a fever.  You are leaking fluid from your vagina.  You have spotting or bleeding from your vagina.  You have severe abdominal pain or cramping.  You have rapid weight loss or weight gain.  You have   shortness of breath with chest pain.  You notice sudden or extreme swelling of your face, hands, ankles, feet, or legs.  Your baby makes fewer than 10 movements in 2 hours.  You have severe headaches that do not go away when you take medicine.  You have vision changes. Summary  The third trimester is from week 28 through week 40, months 7 through 9. The third trimester is a time when the unborn baby (fetus) is growing rapidly.  During the third trimester, your discomfort may increase as you and your baby continue to gain weight. You may have abdominal, leg, and back pain, sleeping problems, and an increased need to urinate.  During the third trimester your breasts will keep growing and they will continue to become tender. A yellow fluid (colostrum) may leak from your breasts. This is the first milk you are producing for your baby.  False labor is a condition in which you feel small, irregular tightenings of the muscles in the womb (contractions) that eventually go away. These are called Braxton Hicks contractions. Contractions may last for hours, days, or even weeks before true labor sets in.  Signs of labor can include: abdominal cramps; regular contractions that start at 10 minutes apart and become stronger and more frequent with time; watery or bloody mucus discharge that comes from the vagina; increased pelvic pressure and dull back pain; and leaking of amniotic fluid. This information is not intended to replace advice given to you by your health  care provider. Make sure you discuss any questions you have with your health care provider. Document Released: 12/23/2000 Document Revised: 04/21/2018 Document Reviewed: 02/04/2016 Elsevier Patient Education  2020 [employer]  

## 2018-12-01 NOTE — Progress Notes (Signed)
ROB C/o leg pain Denies lof, no vb, Good FM in both

## 2018-12-01 NOTE — Progress Notes (Signed)
Routine Prenatal Care Visit  Subjective  Katelyn Mendoza is a 28 y.o. G3P1011 at [redacted]w[redacted]d being seen today for ongoing prenatal care.  She is currently monitored for the following issues for this high-risk pregnancy and has Supervision of high-risk pregnancy; HSV infection; Nausea and vomiting during pregnancy; Twin pregnancy, monochorionic/diamniotic, unspecified trimester; Obesity affecting pregnancy; Bipolar I disorder ([place]); Gallstones; Vulvovaginitis due to yeast; BMI 30.0-30.9,adult; Family history of Down syndrome; Sickle cell trait in mother affecting pregnancy ([place]); Chlamydia trachomatis infection in mother during second trimester of pregnancy; Indication for care in labor and delivery, antepartum; Pregnancy; Threatened preterm labor; and Pregnancy affected by fetal growth restriction on their problem list.  ----------------------------------------------------------------------------------- Patient reports sciatica, discomforts of late pregnancy.  Contractions: Not present. Vag. Bleeding: None.  Movement: Present. No leaking of fluid.  ----------------------------------------------------------------------------------- The following portions of the patient's history were reviewed and updated as appropriate: allergies, current medications, past family history, past medical history, past social history, past surgical history and problem list. Problem list updated.   Objective  Blood pressure 108/70, weight 195 lb (88.5 kg), last menstrual period 03/27/2018, unknown if currently breastfeeding. Pregravid weight 160 lb (72.6 kg) Total Weight Gain 35 lb (15.9 kg)  Fetal Status: Fetal Heart Rate (bpm): 142/151   Movement: Present     General:  Alert, oriented and cooperative. Patient is in no acute distress.  Skin: Skin is warm and dry. No rash noted.   Cardiovascular: Normal heart rate noted  Respiratory: Normal respiratory effort, no problems with respiration noted  Abdomen: Soft, gravid,  appropriate for gestational age. Pain/Pressure: Present     Pelvic:  Cervical exam deferred        Extremities: Normal range of motion.  Edema: Trace  Mental Status: Normal mood and affect. Normal behavior. Normal judgment and thought content.     Assessment   28 y.o. G3P1011 at [redacted]w[redacted]d EDD 01/01/2019, by Last Menstrual Period presenting for a routine prenatal visit.  Plan   pregnancy3 Problems (from 03/27/18 to present)    Problem Noted Resolved   BMI 30.0-30.9,adult 07/08/2018 by Will Bonnet, MD No   Twin pregnancy, monochorionic/diamniotic, unspecified trimester 05/27/2018 by Rod Can, CNM No   Overview Signed 06/15/2018  9:15 PM by Homero Fellers, MD    Monochorionic Diamniotic Twin Pregnancy Plan  Mom should take 60-120mg  of elemental iron a day as well as 1mg  of folic acid  89-21 wks: Evaluate for early growth discordance between  16 wks: Begin Ultrasounds for TTTS checks every 2 weeks (monitor MVP* for each twin and visualize the bladder) 18-20 wks:  Anatomy Ultrasound 22 wks: Fetal Echo 20-Delivery: Growth Ultrasounds every 2-4 weeks** 32 wks: Initiate NSTs  Delivery No complications: 34 0/7 - 37 6/7 Isolated IUGR: 32 0/7- 34 6/7  Genetic screening offered with first trimester screen (11-14 wks) or CVS sampling (10-12 wks)  Risks: TTTS complicates up to 19-41% of monochorionic pregnancies TAPS complicates 7-4% of monochorionic pregnancies TRAP complicates 1% of monochorionic pregnancies A fetal demise is one twin results in a 40-50% risk of death of neurologic handicap in the surviving twin There is a ninefold increased risk of congenital heart defects, 4-5%. Normal risk in 0.5%. Perinatal loss and handicap rate is 3-5 times higher than a dichorionic pregnancy  *MVP for either twin > 8cm or < 2cm should result in MFM referral Weekly [place] if issues with MVP arise  **IUGR or Growth discordance > 20% should prompt MFM consultation CALCULATOR:  http://perinatology.com/calculators/Twin%20Discordance.htm  Obesity affecting pregnancy 05/27/2018 by [name], CNM No   Supervision of high-risk pregnancy 05/10/2018 by Nadara Mustard, MD No   Overview Addendum 10/17/2018  4:37 PM by Nadara Mustard, MD    Clinic [facility] Prenatal Labs  Dating  LMP = 11 wk [place] Blood type: A/Positive/-- (06/26 1530)   Genetic Screen Declines  Antibody:Negative (06/26 1530)  Anatomic US done Rubella: 3.27 (06/26 1530) Varicella: Immune  GTT Early: 187 [x ] 3hr/monitoring     RPR: Non Reactive (06/26 1530)   Rhogam A+ HBsAg: Negative (06/26 1530)   TDaP vaccine                  Flu Shot: HIV: Non Reactive (06/26 1530)   Baby Food          Bottle                      GBS:   Contraception      Unsure Pap: no record of recent pap  Need Valtrex suppression   CS/VBAC N/a   Support Person             Schedule IOL next visit.   GBS and Aptima today. Weekly APT appointments with [facility]. Does not have glucose log, reports that she has had low fasting numbers (60s-70s).    Both twins are moving well.  Preterm labor symptoms and general obstetric precautions including but not limited to vaginal bleeding, contractions, leaking of fluid and fetal movement were reviewed.  Please refer to After Visit Summary for other counseling recommendations.   Return in about 1 week (around 12/08/2018) for HROB/MD visit.  Marcelyn Bruins, CNM 12/01/2018  3:05 PM

## 2018-12-03 LAB — STREP GP B NAA: Strep Gp B NAA: POSITIVE — AB

## 2018-12-05 ENCOUNTER — Ambulatory Visit
Admission: RE | Admit: 2018-12-05 | Discharge: 2018-12-05 | Disposition: A | Discharge status: Home / Self Care | Payer: Medicaid Other | Source: Ambulatory Visit | Attending: Obstetrics and Gynecology | Admitting: Obstetrics and Gynecology

## 2018-12-05 ENCOUNTER — Other Ambulatory Visit: Payer: Self-pay

## 2018-12-05 ENCOUNTER — Ambulatory Visit (HOSPITAL_BASED_OUTPATIENT_CLINIC_OR_DEPARTMENT_OTHER)
Admission: RE | Admit: 2018-12-05 | Discharge: 2018-12-05 | Disposition: A | Discharge status: Home / Self Care | Payer: Medicaid Other | Source: Ambulatory Visit | Attending: Obstetrics and Gynecology | Admitting: Obstetrics and Gynecology

## 2018-12-05 DIAGNOSIS — O30039 Twin pregnancy, monochorionic/diamniotic, unspecified trimester: Secondary | ICD-10-CM

## 2018-12-05 DIAGNOSIS — O365990 Maternal care for other known or suspected poor fetal growth, unspecified trimester, not applicable or unspecified: Secondary | ICD-10-CM

## 2018-12-05 DIAGNOSIS — Z3A36 36 weeks gestation of pregnancy: Secondary | ICD-10-CM | POA: Insufficient documentation

## 2018-12-05 DIAGNOSIS — O99213 Obesity complicating pregnancy, third trimester: Secondary | ICD-10-CM | POA: Diagnosis not present

## 2018-12-05 DIAGNOSIS — Z6830 Body mass index (BMI) 30.0-30.9, adult: Secondary | ICD-10-CM

## 2018-12-05 DIAGNOSIS — O0993 Supervision of high risk pregnancy, unspecified, third trimester: Secondary | ICD-10-CM

## 2018-12-05 DIAGNOSIS — O30033 Twin pregnancy, monochorionic/diamniotic, third trimester: Secondary | ICD-10-CM | POA: Diagnosis not present

## 2018-12-05 LAB — GLUCOSE, CAPILLARY: Glucose-Capillary: 71 mg/dL (ref 70–99)

## 2018-12-05 NOTE — Progress Notes (Signed)
NST interpretation Indication MO Di twins   Start 2:41 pm  End 3:07 pm   A Baseline - 140 moderate variability, reactive  B baseline 140 moderate variaibility reactiv e  NO UCs  See u/s ceph ceph pos fluid pockets in both sacs   Recommend 37th week delivery  Gatha Mayer

## 2018-12-06 LAB — CERVICOVAGINAL ANCILLARY ONLY
Chlamydia: NEGATIVE
Comment: NEGATIVE
Comment: NORMAL
Neisseria Gonorrhea: NEGATIVE

## 2018-12-07 ENCOUNTER — Ambulatory Visit (INDEPENDENT_AMBULATORY_CARE_PROVIDER_SITE_OTHER): Payer: Medicaid Other | Admitting: Obstetrics and Gynecology

## 2018-12-07 ENCOUNTER — Encounter: Payer: Self-pay | Admitting: Obstetrics and Gynecology

## 2018-12-07 ENCOUNTER — Other Ambulatory Visit: Payer: Self-pay

## 2018-12-07 VITALS — BP 122/80 | Wt 197.0 lb

## 2018-12-07 DIAGNOSIS — O0993 Supervision of high risk pregnancy, unspecified, third trimester: Secondary | ICD-10-CM

## 2018-12-07 DIAGNOSIS — Z3A26 26 weeks gestation of pregnancy: Secondary | ICD-10-CM

## 2018-12-07 DIAGNOSIS — O30039 Twin pregnancy, monochorionic/diamniotic, unspecified trimester: Secondary | ICD-10-CM

## 2018-12-07 DIAGNOSIS — Z3A36 36 weeks gestation of pregnancy: Secondary | ICD-10-CM

## 2018-12-07 DIAGNOSIS — B951 Streptococcus, group B, as the cause of diseases classified elsewhere: Secondary | ICD-10-CM | POA: Insufficient documentation

## 2018-12-07 DIAGNOSIS — O30033 Twin pregnancy, monochorionic/diamniotic, third trimester: Secondary | ICD-10-CM

## 2018-12-07 NOTE — Patient Instructions (Addendum)
Induction 12/14/2018- 8am Covid testing 9-10 am 12/12/2018  Labor Induction  Labor induction is when steps are taken to cause a pregnant woman to begin the labor process. Most women go into labor on their own between 37 weeks and 42 weeks of pregnancy. When this does not happen or when there is a medical need for labor to begin, steps may be taken to induce labor. Labor induction causes a pregnant woman's uterus to contract. It also causes the cervix to soften (ripen), open (dilate), and thin out (efface). Usually, labor is not induced before 39 weeks of pregnancy unless there is a medical reason to do so. Your health care provider will determine if labor induction is needed. Before inducing labor, your health care provider will consider a number of factors, including:  Your medical condition and your baby's.  How many weeks along you are in your pregnancy.  How mature your baby's lungs are.  The condition of your cervix.  The position of your baby.  The size of your birth canal. What are some reasons for labor induction? Labor may be induced if:  Your health or your baby's health is at risk.  Your pregnancy is overdue by 1 week or more.  Your water breaks but labor does not start on its own.  There is a low amount of amniotic fluid around your baby. You may also choose (elect) to have labor induced at a certain time. Generally, elective labor induction is done no earlier than 39 weeks of pregnancy. What methods are used for labor induction? Methods used for labor induction include:  Prostaglandin medicine. This medicine starts contractions and causes the cervix to dilate and ripen. It can be taken by mouth (orally) or by being inserted into the vagina (suppository).  Inserting a small, thin tube (catheter) with a balloon into the vagina and then expanding the balloon with water to dilate the cervix.  Stripping the membranes. In this method, your health care provider gently  separates amniotic sac tissue from the cervix. This causes the cervix to stretch, which in turn causes the release of a hormone called progesterone. The hormone causes the uterus to contract. This procedure is often done during an office visit, after which you will be sent home to wait for contractions to begin.  Breaking the water. In this method, your health care provider uses a small instrument to make a small hole in the amniotic sac. This eventually causes the amniotic sac to break. Contractions should begin after a few hours.  Medicine to trigger or strengthen contractions. This medicine is given through an IV that is inserted into a vein in your arm. Except for membrane stripping, which can be done in a clinic, labor induction is done in the hospital so that you and your baby can be carefully monitored. How long does it take for labor to be induced? The length of time it takes to induce labor depends on how ready your body is for labor. Some inductions can take up to 2-3 days, while others may take less than a day. Induction may take longer if:  You are induced early in your pregnancy.  It is your first pregnancy.  Your cervix is not ready. What are some risks associated with labor induction? Some risks associated with labor induction include:  Changes in fetal heart rate, such as being too high, too low, or irregular (erratic).  Failed induction.  Infection in the mother or the baby.  Increased risk of having a  cesarean delivery.  Fetal death.  Breaking off (abruption) of the placenta from the uterus (rare).  Rupture of the uterus (very rare). When induction is needed for medical reasons, the benefits of induction generally outweigh the risks. What are some reasons for not inducing labor? Labor induction should not be done if:  Your baby does not tolerate contractions.  You have had previous surgeries on your uterus, such as a myomectomy, removal of fibroids, or a vertical  scar from a previous cesarean delivery.  Your placenta lies very low in your uterus and blocks the opening of the cervix (placenta previa).  Your baby is not in a head-down position.  The umbilical cord drops down into the birth canal in front of the baby.  There are unusual circumstances, such as the baby being very early (premature).  You have had more than 2 previous cesarean deliveries. Summary  Labor induction is when steps are taken to cause a pregnant woman to begin the labor process.  Labor induction causes a pregnant woman's uterus to contract. It also causes the cervix to ripen, dilate, and efface.  Labor is not induced before 39 weeks of pregnancy unless there is a medical reason to do so.  When induction is needed for medical reasons, the benefits of induction generally outweigh the risks. This information is not intended to replace advice given to you by your health care provider. Make sure you discuss any questions you have with your health care provider. Document Released: 05/20/2006 Document Revised: 01/01/2017 Document Reviewed: 02/12/2016 Elsevier Patient Education  2020 [employer].

## 2018-12-07 NOTE — Progress Notes (Signed)
ROB C/o swelling in lower extremities  Denies lof, no vb Good FM

## 2018-12-07 NOTE — Progress Notes (Signed)
Routine Prenatal Care Visit  Subjective  Katelyn Mendoza is a 28 y.o. G3P1011 at [redacted]w[redacted]d being seen today for ongoing prenatal care.  She is currently monitored for the following issues for this high-risk pregnancy and has Supervision of high-risk pregnancy; HSV infection; Nausea and vomiting during pregnancy; Twin pregnancy, monochorionic/diamniotic, unspecified trimester; Obesity affecting pregnancy; Bipolar I disorder ([place]); Gallstones; Vulvovaginitis due to yeast; BMI 30.0-30.9,adult; Family history of Down syndrome; Sickle cell trait in mother affecting pregnancy ([place]); Chlamydia trachomatis infection in mother during second trimester of pregnancy; Indication for care in labor and delivery, antepartum; Pregnancy; Threatened preterm labor; Pregnancy affected by fetal growth restriction; and Positive GBS test on their problem list.  ----------------------------------------------------------------------------------- Patient reports no complaints.   Contractions: Not present. Vag. Bleeding: None.  Movement: Present. Denies leaking of fluid.  ----------------------------------------------------------------------------------- The following portions of the patient's history were reviewed and updated as appropriate: allergies, current medications, past family history, past medical history, past social history, past surgical history and problem list. Problem list updated.   Objective  Blood pressure 122/80, weight 197 lb (89.4 kg), last menstrual period 03/27/2018, unknown if currently breastfeeding. Pregravid weight 160 lb (72.6 kg) Total Weight Gain 37 lb (16.8 kg) Urinalysis:      Fetal Status: Fetal Heart Rate (bpm): 128/140   Movement: Present  Presentation: Vertex  General:  Alert, oriented and cooperative. Patient is in no acute distress.  Skin: Skin is warm and dry. No rash noted.   Cardiovascular: Normal heart rate noted  Respiratory: Normal respiratory effort, no problems with  respiration noted  Abdomen: Soft, gravid, appropriate for gestational age. Pain/Pressure: Present     Pelvic:  Cervical exam performed Dilation: 3 Effacement (%): 90 Station: -1  Extremities: Normal range of motion.  Edema: Moderate pitting, indentation subsides rapidly  Mental Status: Normal mood and affect. Normal behavior. Normal judgment and thought content.     Assessment   28 y.o. G3P1011 at [redacted]w[redacted]d by  01/01/2019, by Last Menstrual Period presenting for routine prenatal visit  Plan   pregnancy3 Problems (from 03/27/18 to present)    Problem Noted Resolved   BMI 30.0-30.9,adult 07/08/2018 by Will Bonnet, MD No   Twin pregnancy, monochorionic/diamniotic, unspecified trimester 05/27/2018 by Rod Can, CNM No   Overview Signed 06/15/2018  9:15 PM by Homero Fellers, MD    Monochorionic Diamniotic Twin Pregnancy Plan  Mom should take 60-120mg  of elemental iron a day as well as 1mg  of folic acid  24-58 wks: Evaluate for early growth discordance between  16 wks: Begin Ultrasounds for TTTS checks every 2 weeks (monitor MVP* for each twin and visualize the bladder) 18-20 wks:  Anatomy Ultrasound 22 wks: Fetal Echo 20-Delivery: Growth Ultrasounds every 2-4 weeks** 32 wks: Initiate NSTs  Delivery No complications: 34 0/7 - 37 6/7 Isolated IUGR: 32 0/7- 34 6/7  Genetic screening offered with first trimester screen (11-14 wks) or CVS sampling (10-12 wks)  Risks: TTTS complicates up to 09-98% of monochorionic pregnancies TAPS complicates 3-3% of monochorionic pregnancies TRAP complicates 1% of monochorionic pregnancies A fetal demise is one twin results in a 40-50% risk of death of neurologic handicap in the surviving twin There is a ninefold increased risk of congenital heart defects, 4-5%. Normal risk in 0.5%. Perinatal loss and handicap rate is 3-5 times higher than a dichorionic pregnancy  *MVP for either twin > 8cm or < 2cm should result in MFM referral Weekly  [place] if issues with MVP arise  **IUGR or  Growth discordance > 20% should prompt MFM consultation CALCULATOR: http://perinatology.com/calculators/Twin%20Discordance.htm       Obesity affecting pregnancy 05/27/2018 by [name], CNM No   Supervision of high-risk pregnancy 05/10/2018 by Nadara Mustard, MD No   Overview Addendum 12/07/2018  8:44 AM by Vena Austria, MD    Clinic [facility] Prenatal Labs  Dating  LMP = 11 wk [place] Blood type: A/Positive/-- (06/26 1530)   Genetic Screen Declines  Antibody:Negative (06/26 1530)  Anatomic US done Rubella: 3.27 (06/26 1530) Varicella: Immune  GTT Early: 187 [x ] 3hr/monitoring     RPR: Non Reactive (06/26 1530)   Rhogam A+ HBsAg: Negative (06/26 1530)   TDaP vaccine                  Flu Shot: HIV: Non Reactive (06/26 1530)   Baby Food          Bottle                      [id number]  Contraception      Unsure Pap: no record of recent pap  Need Valtrex suppression   CS/VBAC N/a   Support Person                Gestational age appropriate obstetric precautions including but not limited to vaginal bleeding, contractions, leaking of fluid and fetal movement were reviewed in detail with the patient.    IOL scheduled for 37 weeks. Patient given instructions regarding covid testing.  Reviewed warning signs of preeclampsia with patient Continue valtrex She has not regularly been checking her blood sugars. She reports checking a fasting in the am which is usually 70-80.   Return if symptoms worsen or fail to improve.  Natale Milch MD [facility], [facility] 12/07/2018, 4:31 PM

## 2018-12-12 ENCOUNTER — Ambulatory Visit (INDEPENDENT_AMBULATORY_CARE_PROVIDER_SITE_OTHER): Payer: Medicaid Other | Admitting: Obstetrics and Gynecology

## 2018-12-12 ENCOUNTER — Other Ambulatory Visit: Payer: Medicaid Other

## 2018-12-12 ENCOUNTER — Other Ambulatory Visit: Payer: Self-pay

## 2018-12-12 ENCOUNTER — Other Ambulatory Visit
Admission: RE | Admit: 2018-12-12 | Discharge: 2018-12-12 | Disposition: A | Discharge status: Home / Self Care | Payer: Medicaid Other | Source: Ambulatory Visit | Attending: Certified Nurse Midwife | Admitting: Certified Nurse Midwife

## 2018-12-12 ENCOUNTER — Emergency Department
Admission: EM | Admit: 2018-12-12 | Discharge: 2018-12-12 | Discharge status: Against Medical Advice | Payer: Medicaid Other | Source: Home / Self Care

## 2018-12-12 ENCOUNTER — Telehealth: Payer: Self-pay

## 2018-12-12 ENCOUNTER — Encounter: Payer: Self-pay | Admitting: Obstetrics and Gynecology

## 2018-12-12 VITALS — BP 132/84 | Wt 198.0 lb

## 2018-12-12 DIAGNOSIS — O0993 Supervision of high risk pregnancy, unspecified, third trimester: Secondary | ICD-10-CM

## 2018-12-12 DIAGNOSIS — R21 Rash and other nonspecific skin eruption: Secondary | ICD-10-CM

## 2018-12-12 DIAGNOSIS — Z01812 Encounter for preprocedural laboratory examination: Secondary | ICD-10-CM | POA: Insufficient documentation

## 2018-12-12 DIAGNOSIS — O163 Unspecified maternal hypertension, third trimester: Secondary | ICD-10-CM

## 2018-12-12 DIAGNOSIS — Z3A37 37 weeks gestation of pregnancy: Secondary | ICD-10-CM

## 2018-12-12 DIAGNOSIS — O30033 Twin pregnancy, monochorionic/diamniotic, third trimester: Secondary | ICD-10-CM

## 2018-12-12 DIAGNOSIS — O30039 Twin pregnancy, monochorionic/diamniotic, unspecified trimester: Secondary | ICD-10-CM

## 2018-12-12 DIAGNOSIS — O99820 Streptococcus B carrier state complicating pregnancy: Secondary | ICD-10-CM

## 2018-12-12 DIAGNOSIS — Z20828 Contact with and (suspected) exposure to other viral communicable diseases: Secondary | ICD-10-CM | POA: Insufficient documentation

## 2018-12-12 MED ORDER — NYSTATIN-TRIAMCINOLONE 100000-0.1 UNIT/GM-% EX CREA: 1.0000 "application " | TOPICAL_CREAM | Freq: Two times a day (BID) | CUTANEOUS | 1 refills | Status: DC

## 2018-12-12 NOTE — Telephone Encounter (Signed)
Patient aware and apt scheduled for 2:10 today.

## 2018-12-12 NOTE — Progress Notes (Signed)
Routine Prenatal Care Visit  Subjective  Katelyn Mendoza is a 28 y.o. G3P1011 at [redacted]w[redacted]d being seen today for ongoing prenatal care.  She is currently monitored for the following issues for this high-risk pregnancy and has Supervision of high-risk pregnancy; HSV infection; Nausea and vomiting during pregnancy; Twin pregnancy, monochorionic/diamniotic, unspecified trimester; Obesity affecting pregnancy; Bipolar I disorder ([place]); Gallstones; Vulvovaginitis due to yeast; BMI 30.0-30.9,adult; Family history of Down syndrome; Sickle cell trait in mother affecting pregnancy ([place]); Chlamydia trachomatis infection in mother during second trimester of pregnancy; Indication for care in labor and delivery, antepartum; Pregnancy; Threatened preterm labor; Pregnancy affected by fetal growth restriction; and Positive GBS test on their problem list.  ----------------------------------------------------------------------------------- Patient reports itching in her bilateral groin and across her upper abdomen..   Contractions: Not present. Vag. Bleeding: None.  Movement: Present. Denies leaking of fluid.  ----------------------------------------------------------------------------------- The following portions of the patient's history were reviewed and updated as appropriate: allergies, current medications, past family history, past medical history, past social history, past surgical history and problem list. Problem list updated.   Objective  Blood pressure 132/84, weight 198 lb (89.8 kg), last menstrual period 03/27/2018, unknown if currently breastfeeding. Pregravid weight 160 lb (72.6 kg) Total Weight Gain 38 lb (17.2 kg) Urinalysis:      Fetal Status: Fetal Heart Rate (bpm): 140/154   Movement: Present     General:  Alert, oriented and cooperative. Patient is in no acute distress.  Skin: Skin is warm and dry. No rash noted.   Cardiovascular: Normal heart rate noted  Respiratory: Normal respiratory  effort, no problems with respiration noted  Abdomen: Soft, gravid, appropriate for gestational age. Pain/Pressure: Present     Pelvic:  Cervical exam deferred        Extremities: Normal range of motion.  Edema: Deep pitting, indentation remains for a short time  Mental Status: Normal mood and affect. Normal behavior. Normal judgment and thought content.   Assessment   28 y.o. G3P1011 at [redacted]w[redacted]d by  01/01/2019, by Last Menstrual Period presenting for routine prenatal visit  Plan   pregnancy3 Problems (from 03/27/18 to present)    Problem Noted Resolved   BMI 30.0-30.9,adult 07/08/2018 by Will Bonnet, MD No   Twin pregnancy, monochorionic/diamniotic, unspecified trimester 05/27/2018 by Rod Can, CNM No   Overview Signed 06/15/2018  9:15 PM by Homero Fellers, MD    Monochorionic Diamniotic Twin Pregnancy Plan  Mom should take 60-120mg  of elemental iron a day as well as 1mg  of folic acid  40-10 wks: Evaluate for early growth discordance between  16 wks: Begin Ultrasounds for TTTS checks every 2 weeks (monitor MVP* for each twin and visualize the bladder) 18-20 wks:  Anatomy Ultrasound 22 wks: Fetal Echo 20-Delivery: Growth Ultrasounds every 2-4 weeks** 32 wks: Initiate NSTs  Delivery No complications: 34 0/7 - 37 6/7 Isolated IUGR: 32 0/7- 34 6/7  Genetic screening offered with first trimester screen (11-14 wks) or CVS sampling (10-12 wks)  Risks: TTTS complicates up to 27-25% of monochorionic pregnancies TAPS complicates 3-6% of monochorionic pregnancies TRAP complicates 1% of monochorionic pregnancies A fetal demise is one twin results in a 40-50% risk of death of neurologic handicap in the surviving twin There is a ninefold increased risk of congenital heart defects, 4-5%. Normal risk in 0.5%. Perinatal loss and handicap rate is 3-5 times higher than a dichorionic pregnancy  *MVP for either twin > 8cm or < 2cm should result in MFM referral Weekly [place] if  issues  with MVP arise  **IUGR or Growth discordance > 20% should prompt MFM consultation CALCULATOR: http://perinatology.com/calculators/Twin%20Discordance.htm       Obesity affecting pregnancy 05/27/2018 by [name], CNM No   Supervision of high-risk pregnancy 05/10/2018 by Nadara Mustard, MD No   Overview Addendum 12/07/2018  8:44 AM by Vena Austria, MD    Clinic [facility] Prenatal Labs  Dating  LMP = 11 wk [place] Blood type: A/Positive/-- (06/26 1530)   Genetic Screen Declines  Antibody:Negative (06/26 1530)  Anatomic US done Rubella: 3.27 (06/26 1530) Varicella: Immune  GTT Early: 187 [x ] 3hr/monitoring     RPR: Non Reactive (06/26 1530)   Rhogam A+ HBsAg: Negative (06/26 1530)   TDaP vaccine                  Flu Shot: HIV: Non Reactive (06/26 1530)   Baby Food          Bottle                      [id number]  Contraception      Unsure Pap: no record of recent pap  Need Valtrex suppression   CS/VBAC N/a   Support Person                Gestational age appropriate obstetric precautions including but not limited to vaginal bleeding, contractions, leaking of fluid and fetal movement were reviewed in detail with the patient.    STAT labs for preeclampsia, urine negative for protein in the office. Will follow up with patient. Called at 21:50 but did not answer.  Discussed warning signs of preeclampsia IOL planned for Wednesday. Mycolog cream for itching Bile acids sent.   Return if symptoms worsen or fail to improve.  Natale Milch MD [facility], [facility] 12/12/2018, 9:52 PM

## 2018-12-12 NOTE — Telephone Encounter (Signed)
Needs to be seen to evaluate. She can be added on to my afternoon schedule. Double booking at 2:10 is probably easiest.

## 2018-12-12 NOTE — Progress Notes (Signed)
ROB C/o rash on leg and stomach started x 2 days ago, very itchy  Denies lof, no vb Good FM in both

## 2018-12-12 NOTE — Telephone Encounter (Signed)
Spoke w/patient. Her belly is itching as well and has been for 3 days. She hasn't used any OTC meds. Advised she can use OTC Benadryl/Hydrocortisone cream. If this doesn't help would need apt for eval for PUPPS. Patient advised she is scheduled to deliver in 2 days. Advised I will discuss with Dr. Gilman Schmidt for best plan of action and return call.

## 2018-12-12 NOTE — Telephone Encounter (Signed)
Patient states she has a rash on her leg. She is inquiring if she can get a rx for an ointment/cream. Cb#[phone]

## 2018-12-13 ENCOUNTER — Other Ambulatory Visit: Payer: Self-pay

## 2018-12-13 ENCOUNTER — Inpatient Hospital Stay: Payer: Medicaid Other | Admitting: Anesthesiology

## 2018-12-13 ENCOUNTER — Inpatient Hospital Stay
Admission: EM | Admit: 2018-12-13 | Discharge: 2018-12-18 | DRG: 806 | Disposition: A | Discharge status: Home / Self Care | Payer: Medicaid Other | Attending: Obstetrics & Gynecology | Admitting: Obstetrics & Gynecology

## 2018-12-13 DIAGNOSIS — L299 Pruritus, unspecified: Secondary | ICD-10-CM | POA: Diagnosis present

## 2018-12-13 DIAGNOSIS — O9081 Anemia of the puerperium: Secondary | ICD-10-CM | POA: Diagnosis not present

## 2018-12-13 DIAGNOSIS — O30033 Twin pregnancy, monochorionic/diamniotic, third trimester: Secondary | ICD-10-CM | POA: Diagnosis present

## 2018-12-13 DIAGNOSIS — D573 Sickle-cell trait: Secondary | ICD-10-CM | POA: Diagnosis present

## 2018-12-13 DIAGNOSIS — D62 Acute posthemorrhagic anemia: Secondary | ICD-10-CM | POA: Diagnosis not present

## 2018-12-13 DIAGNOSIS — Z3A37 37 weeks gestation of pregnancy: Secondary | ICD-10-CM

## 2018-12-13 DIAGNOSIS — O30039 Twin pregnancy, monochorionic/diamniotic, unspecified trimester: Secondary | ICD-10-CM

## 2018-12-13 DIAGNOSIS — O1414 Severe pre-eclampsia complicating childbirth: Principal | ICD-10-CM | POA: Diagnosis present

## 2018-12-13 DIAGNOSIS — Z6830 Body mass index (BMI) 30.0-30.9, adult: Secondary | ICD-10-CM

## 2018-12-13 DIAGNOSIS — O0993 Supervision of high risk pregnancy, unspecified, third trimester: Secondary | ICD-10-CM

## 2018-12-13 DIAGNOSIS — O139 Gestational [pregnancy-induced] hypertension without significant proteinuria, unspecified trimester: Secondary | ICD-10-CM | POA: Diagnosis present

## 2018-12-13 DIAGNOSIS — O2686 Pruritic urticarial papules and plaques of pregnancy (PUPPP): Secondary | ICD-10-CM | POA: Diagnosis present

## 2018-12-13 DIAGNOSIS — O99824 Streptococcus B carrier state complicating childbirth: Secondary | ICD-10-CM | POA: Diagnosis present

## 2018-12-13 DIAGNOSIS — O134 Gestational [pregnancy-induced] hypertension without significant proteinuria, complicating childbirth: Secondary | ICD-10-CM | POA: Diagnosis not present

## 2018-12-13 DIAGNOSIS — Z20828 Contact with and (suspected) exposure to other viral communicable diseases: Secondary | ICD-10-CM | POA: Diagnosis present

## 2018-12-13 DIAGNOSIS — A6000 Herpesviral infection of urogenital system, unspecified: Secondary | ICD-10-CM | POA: Diagnosis present

## 2018-12-13 DIAGNOSIS — O26893 Other specified pregnancy related conditions, third trimester: Secondary | ICD-10-CM | POA: Diagnosis present

## 2018-12-13 DIAGNOSIS — O99213 Obesity complicating pregnancy, third trimester: Secondary | ICD-10-CM

## 2018-12-13 DIAGNOSIS — O163 Unspecified maternal hypertension, third trimester: Secondary | ICD-10-CM | POA: Diagnosis present

## 2018-12-13 DIAGNOSIS — O9832 Other infections with a predominantly sexual mode of transmission complicating childbirth: Secondary | ICD-10-CM | POA: Diagnosis present

## 2018-12-13 DIAGNOSIS — R030 Elevated blood-pressure reading, without diagnosis of hypertension: Secondary | ICD-10-CM | POA: Diagnosis present

## 2018-12-13 LAB — CBC
HCT: 29 % — ABNORMAL LOW (ref 36.0–46.0)
Hematocrit: 29.9 % — ABNORMAL LOW (ref 34.0–46.6)
Hemoglobin: 9.5 g/dL — ABNORMAL LOW (ref 11.1–15.9)
Hemoglobin: 9.3 g/dL — ABNORMAL LOW (ref 12.0–15.0)
MCH: 23.5 pg — ABNORMAL LOW (ref 26.6–33.0)
MCH: 23.3 pg — ABNORMAL LOW (ref 26.0–34.0)
MCHC: 31.8 g/dL (ref 31.5–35.7)
MCHC: 32.1 g/dL (ref 30.0–36.0)
MCV: 74 fL — ABNORMAL LOW (ref 79–97)
MCV: 72.7 fL — ABNORMAL LOW (ref 80.0–100.0)
Platelets: 201 10*3/uL (ref 150–450)
Platelets: 190 10*3/uL (ref 150–400)
RBC: 4.05 x10E6/uL (ref 3.77–5.28)
RBC: 3.99 MIL/uL (ref 3.87–5.11)
RDW: 18.1 % — ABNORMAL HIGH (ref 11.7–15.4)
RDW: 17.2 % — ABNORMAL HIGH (ref 11.5–15.5)
WBC: 5.3 10*3/uL (ref 3.4–10.8)
WBC: 5.5 10*3/uL (ref 4.0–10.5)
nRBC: 0 % (ref 0.0–0.2)

## 2018-12-13 LAB — COMPREHENSIVE METABOLIC PANEL
ALT: 6 IU/L (ref 0–32)
ALT: 9 U/L (ref 0–44)
AST: 11 IU/L (ref 0–40)
AST: 19 U/L (ref 15–41)
Albumin/Globulin Ratio: 1.3 (ref 1.2–2.2)
Albumin: 3 g/dL — ABNORMAL LOW (ref 3.9–5.0)
Albumin: 2.5 g/dL — ABNORMAL LOW (ref 3.5–5.0)
Alkaline Phosphatase: 251 IU/L — ABNORMAL HIGH (ref 39–117)
Alkaline Phosphatase: 207 U/L — ABNORMAL HIGH (ref 38–126)
Anion gap: 9 (ref 5–15)
BUN/Creatinine Ratio: 3 — ABNORMAL LOW (ref 9–23)
BUN: 2 mg/dL — ABNORMAL LOW (ref 6–20)
BUN: <5 mg/dL — ABNORMAL LOW (ref 6–20)
Bilirubin Total: 0.4 mg/dL (ref 0.0–1.2)
CO2: 21 mmol/L (ref 20–29)
CO2: 22 mmol/L (ref 22–32)
Calcium: 8.6 mg/dL — ABNORMAL LOW (ref 8.7–10.2)
Calcium: 8.2 mg/dL — ABNORMAL LOW (ref 8.9–10.3)
Chloride: 104 mmol/L (ref 96–106)
Chloride: 107 mmol/L (ref 98–111)
Creatinine, Ser: 0.68 mg/dL (ref 0.57–1.00)
Creatinine, Ser: 0.73 mg/dL (ref 0.44–1.00)
GFR calc Af Amer: 139 mL/min/{1.73_m2} (ref >59)
GFR calc Af Amer: >60 mL/min (ref >60)
GFR calc non Af Amer: 120 mL/min/{1.73_m2} (ref >59)
GFR calc non Af Amer: >60 mL/min (ref >60)
Globulin, Total: 2.4 g/dL (ref 1.5–4.5)
Glucose, Bld: 123 mg/dL — ABNORMAL HIGH (ref 70–99)
Glucose: 60 mg/dL — ABNORMAL LOW (ref 65–99)
Potassium: 3.4 mmol/L — ABNORMAL LOW (ref 3.5–5.2)
Potassium: 2.8 mmol/L — ABNORMAL LOW (ref 3.5–5.1)
Sodium: 140 mmol/L (ref 134–144)
Sodium: 138 mmol/L (ref 135–145)
Total Bilirubin: 0.7 mg/dL (ref 0.3–1.2)
Total Protein: 5.4 g/dL — ABNORMAL LOW (ref 6.0–8.5)
Total Protein: 5.7 g/dL — ABNORMAL LOW (ref 6.5–8.1)

## 2018-12-13 LAB — PROTEIN / CREATININE RATIO, URINE
Creatinine, Urine: 34.6 mg/dL
Creatinine, Urine: 34 mg/dL
Protein Creatinine Ratio: 0.21 mg/mg{Cre} — ABNORMAL HIGH (ref 0.00–0.15)
Protein, Ur: 7.3 mg/dL
Protein/Creat Ratio: 211 mg/g creat — ABNORMAL HIGH (ref 0–200)
Total Protein, Urine: 7 mg/dL

## 2018-12-13 LAB — GLUCOSE, CAPILLARY: Glucose-Capillary: 72 mg/dL (ref 70–99)

## 2018-12-13 LAB — SARS CORONAVIRUS 2 (TAT 6-24 HRS): SARS Coronavirus 2: NEGATIVE

## 2018-12-13 LAB — BILE ACIDS, TOTAL: Bile Acids Total: 3.1 umol/L (ref 0.0–10.0)

## 2018-12-13 MED ORDER — TERBUTALINE SULFATE 1 MG/ML IJ SOLN: 0.2500 mg | Freq: Once | INTRAMUSCULAR | Status: DC | PRN

## 2018-12-13 MED ORDER — FENTANYL 2.5 MCG/ML W/ROPIVACAINE 0.15% IN NS 100 ML EPIDURAL (ARMC)
EPIDURAL | Status: AC
  Filled 2018-12-13: qty 100

## 2018-12-13 MED ORDER — LACTATED RINGERS IV SOLN: 500.0000 mL | INTRAVENOUS | Status: DC | PRN

## 2018-12-13 MED ORDER — ACETAMINOPHEN 325 MG PO TABS
650.0000 mg | ORAL_TABLET | ORAL | Status: DC | PRN
  Administered 2018-12-14: 650 mg via ORAL
  Filled 2018-12-13: qty 2

## 2018-12-13 MED ORDER — FENTANYL 2.5 MCG/ML W/ROPIVACAINE 0.15% IN NS 100 ML EPIDURAL (ARMC)
EPIDURAL | Status: DC | PRN
  Administered 2018-12-13: 12 mL/h via EPIDURAL

## 2018-12-13 MED ORDER — LIDOCAINE HCL (PF) 1 % IJ SOLN
INTRAMUSCULAR | Status: DC | PRN
  Administered 2018-12-13: 4 mL via INTRADERMAL
  Administered 2018-12-14: 4 mL
  Administered 2018-12-14: 2 mL

## 2018-12-13 MED ORDER — PHENYLEPHRINE 40 MCG/ML (10ML) SYRINGE FOR IV PUSH (FOR BLOOD PRESSURE SUPPORT)
80.0000 ug | PREFILLED_SYRINGE | INTRAVENOUS | Status: DC | PRN
  Filled 2018-12-13: qty 10

## 2018-12-13 MED ORDER — LACTATED RINGERS IV SOLN
INTRAVENOUS | Status: DC
  Administered 2018-12-13: 17:00:00 via INTRAVENOUS

## 2018-12-13 MED ORDER — LIDOCAINE HCL (PF) 1 % IJ SOLN: 30.0000 mL | INTRAMUSCULAR | Status: DC | PRN

## 2018-12-13 MED ORDER — EPHEDRINE 5 MG/ML INJ
10.0000 mg | INTRAVENOUS | Status: DC | PRN
  Filled 2018-12-13: qty 2

## 2018-12-13 MED ORDER — POTASSIUM CHLORIDE 20 MEQ PO PACK
20.0000 meq | PACK | Freq: Two times a day (BID) | ORAL | Status: DC
  Administered 2018-12-13: 20 meq via ORAL
  Filled 2018-12-13 (×3): qty 1

## 2018-12-13 MED ORDER — BUPIVACAINE HCL (PF) 0.25 % IJ SOLN
INTRAMUSCULAR | Status: DC | PRN
  Administered 2018-12-13: 3 mL via EPIDURAL
  Administered 2018-12-13: 4 mL via EPIDURAL

## 2018-12-13 MED ORDER — ONDANSETRON HCL 4 MG/2ML IJ SOLN: 4.0000 mg | Freq: Four times a day (QID) | INTRAMUSCULAR | Status: DC | PRN

## 2018-12-13 MED ORDER — OXYTOCIN 40 UNITS IN NORMAL SALINE INFUSION - SIMPLE MED
1.0000 m[IU]/min | INTRAVENOUS | Status: DC
  Administered 2018-12-13: 2 m[IU]/min via INTRAVENOUS
  Filled 2018-12-13: qty 1000

## 2018-12-13 MED ORDER — SODIUM CHLORIDE 0.9 % IV SOLN
5.0000 10*6.[IU] | Freq: Once | INTRAVENOUS | Status: AC
  Administered 2018-12-13: 5 10*6.[IU] via INTRAVENOUS
  Filled 2018-12-13: qty 5

## 2018-12-13 MED ORDER — OXYTOCIN 40 UNITS IN NORMAL SALINE INFUSION - SIMPLE MED
2.5000 [IU]/h | INTRAVENOUS | Status: DC
  Administered 2018-12-14: 2.5 [IU]/h via INTRAVENOUS
  Filled 2018-12-13: qty 1000

## 2018-12-13 MED ORDER — OXYTOCIN BOLUS FROM INFUSION: 500.0000 mL | Freq: Once | INTRAVENOUS | Status: DC

## 2018-12-13 MED ORDER — SOD CITRATE-CITRIC ACID 500-334 MG/5ML PO SOLN
30.0000 mL | ORAL | Status: DC | PRN
  Administered 2018-12-13: 30 mL via ORAL
  Filled 2018-12-13: qty 30

## 2018-12-13 MED ORDER — INSULIN ASPART 100 UNIT/ML ~~LOC~~ SOLN: 0.0000 [IU] | SUBCUTANEOUS | Status: DC

## 2018-12-13 MED ORDER — LACTATED RINGERS IV SOLN: 500.0000 mL | Freq: Once | INTRAVENOUS | Status: DC

## 2018-12-13 MED ORDER — DIPHENHYDRAMINE HCL 50 MG/ML IJ SOLN
12.5000 mg | INTRAMUSCULAR | Status: DC | PRN
  Administered 2018-12-13: 12.5 mg via INTRAVENOUS
  Filled 2018-12-13: qty 1

## 2018-12-13 MED ORDER — FENTANYL-BUPIVACAINE-NACL 0.5-0.125-0.9 MG/250ML-% EP SOLN
12.0000 mL/h | EPIDURAL | Status: DC | PRN
  Filled 2018-12-13: qty 250

## 2018-12-13 MED ORDER — PENICILLIN G 3 MILLION UNITS IVPB - SIMPLE MED
3.0000 10*6.[IU] | INTRAVENOUS | Status: DC
  Administered 2018-12-13: 3 10*6.[IU] via INTRAVENOUS
  Filled 2018-12-13 (×5): qty 100

## 2018-12-13 NOTE — Progress Notes (Signed)
Pt removed from fetal monitoring after a category 1 NST. Per verbal order of the provider. Still monitoring blood pressures q 15 mins.

## 2018-12-13 NOTE — Anesthesia Preprocedure Evaluation (Signed)
Anesthesia Evaluation  Patient identified by MRN, date of birth, ID band Patient awake    Reviewed: Allergy & Precautions, H&P , NPO status , Patient's Chart, lab work & pertinent test results, reviewed documented beta blocker date and time   History of Anesthesia Complications (+) POST - OP SPINAL HEADACHE and history of anesthetic complications  Airway Mallampati: II  TM Distance: >3 FB Neck ROM: full    Dental no notable dental hx. (+) Teeth Intact   Pulmonary neg pulmonary ROS, Current Smoker,    Pulmonary exam normal breath sounds clear to auscultation       Cardiovascular Exercise Tolerance: Good hypertension, On Medications  Rhythm:regular Rate:Normal     Neuro/Psych  Headaches, PSYCHIATRIC DISORDERS Bipolar Disorder    GI/Hepatic negative GI ROS, Neg liver ROS,   Endo/Other  negative endocrine ROSdiabetes, Gestational  Renal/GU      Musculoskeletal   Abdominal   Peds  Hematology negative hematology ROS (+)   Anesthesia Other Findings   Reproductive/Obstetrics (+) Pregnancy                             Anesthesia Physical Anesthesia Plan  ASA: II  Anesthesia Plan: Epidural   Post-op Pain Management:    Induction:   PONV Risk Score and Plan:   Airway Management Planned:   Additional Equipment:   Intra-op Plan:   Post-operative Plan:   Informed Consent: I have reviewed the patients History and Physical, chart, labs and discussed the procedure including the risks, benefits and alternatives for the proposed anesthesia with the patient or authorized representative who has indicated his/her understanding and acceptance.       Plan Discussed with:   Anesthesia Plan Comments:         Anesthesia Quick Evaluation

## 2018-12-13 NOTE — OB Triage Note (Signed)
Pt sent from office for [facility] workup. Pt denies any HA, blurry vision, or epigastric pain. +2 reflexes, absent clonus. +2 pitting edema on bilateral lower extremities. Lungs clear. Pt states she has some itching on her abdomen and legs. Denies LOF or bleeding. Reports positive fetal movement. BPs cycling q15 mins. Will continue to monitor.

## 2018-12-13 NOTE — H&P (Signed)
Obstetric H&P   Chief Complaint: Elevated BP in clinic yesterday  Prenatal Care Provider: [facility]  History of Present Illness: 28 y.o. G3P1011 47w2dby 01/01/2019, by Last Menstrual Period. Mo/DI twin pregnancy presenting to L&D after being seen in clinic yesterday with elevated blood pressures and whole body pruritus (mostly feet and abdomen) with bile acids checked yesterday returning normal.  SAhe has an abnormal 1-hr OGTT and did not do a three hour choosing to check BG instead.  Home BG checks were noted to be in the normal range.  She did received BMZ during admission for possible preterm labor 11/02/2018  She reports increased lower extremity edema but denies HA, VC, RUQ or epigastric pain.  No LOF, no VB, no ctx, +FM.    Most recent growth scan 11/28/2018 at 328w1d: 2484g or 5lbs 8oz c/w 38%ile  And B 2113g or 4lbs 11oz c/w 8%ile discordance of 15%.  SDP on both babies, as well as weekly BP and doppler have been normal.    Pelvis tested to 3030g.  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    Pregravid weight 72.6 kg Total Weight Gain 18.1 kg  pregnancy3 Problems (from 03/27/18 to present)    Problem Noted Resolved   BMI 30.0-30.9,adult 07/08/2018 by JaWill BonnetMD No   Twin pregnancy, monochorionic/diamniotic, unspecified trimester 05/27/2018 by GlRod CanCNM No   Overview Signed 06/15/2018  9:15 PM by ScHomero FellersMD    Monochorionic Diamniotic Twin Pregnancy Plan  Mom should take [id number] elemental iron a day as well as 1mg23m folic acid  11-132-91: Evaluate for early growth discordance between  16 wks: Begin Ultrasounds for TTTS checks every 2 weeks (monitor MVP* for each twin and visualize the bladder) 18-20 wks:  Anatomy Ultrasound 22 wks: Fetal Echo 20-Delivery: Growth Ultrasounds every 2-4 weeks** 32 wks: Initiate NSTs  Delivery No complications: 34 0/7 - 37 6/7 Isolated IUGR: 32 0/7- 34 6/7  Genetic screening offered with first trimester screen  (11-14 wks) or CVS sampling (10-12 wks)  Risks: TTTS complicates up to 10-191-66%monochorionic pregnancies TAPS complicates 3-5%0-6%monochorionic pregnancies TRAP complicates 1% of monochorionic pregnancies A fetal demise is one twin results in a 40-50% risk of death of neurologic handicap in the surviving twin There is a ninefold increased risk of congenital heart defects, 4-5%. Normal risk in 0.5%. Perinatal loss and handicap rate is 3-5 times higher than a dichorionic pregnancy  *MVP for either twin > 8cm or < 2cm should result in MFM referral Weekly US iKoreaissues with MVP arise  **IUGR or Growth discordance > 20% should prompt MFM consultation CALCULATOR: [email or web address]       Obesity affecting pregnancy 05/27/2018 by GledRod CanM No   Supervision of high-risk pregnancy 05/10/2018 by HarrGae Dry No   Overview Addendum 12/07/2018  8:44 AM by StaeMalachy Mood    Clinic [facility] Prenatal Labs  Dating  LMP = 11 wk US BKoreaod type: A/Positive/-- (06/26 1530)   Genetic Screen Declines  Antibody:Negative (06/26 1530)  Anatomic US dKoreae Rubella: 3.27 (06/26 1530) Varicella: Immune  GTT Early: 187 60] 3hr/monitoring     RPR: Non Reactive (06/26 1530)   Rhogam A+ HBsAg: Negative (06/26 1530)   TDaP vaccine                  Flu Shot: HIV: Non Reactive (06/26 1530)   Baby Food  Bottle                      [id number]  Contraception      Unsure Pap: no record of recent pap  Need Valtrex suppression   CS/VBAC N/a   Support Person                Review of Systems: 10 point review of systems negative unless otherwise noted in HPI  Past Medical History: Past Medical History:  Diagnosis Date  . Gallstones   . HSV (herpes simplex virus) infection   . Mental disorder    bipolar disorder dx'd at age 6  . Sickle cell trait ([place])   . Spinal headache   . Urinary tract infection affecting care of mother, antepartum      Past Surgical History: Past Surgical History:  Procedure Laterality Date  . NO PAST SURGERIES      Past Obstetric History: # 1 - Date: 2008, Sex: None, Weight: None, GA: None, Delivery: None, Apgar1: None, Apgar5: None, Living: None, Birth Comments: None  # 2 - Date: 02/18/15, Sex: Female, Weight: 3030 g, GA: [redacted]w[redacted]d Delivery: Vaginal, Vacuum (Extractor), Apgar1: 7, Apgar5: 9, Living: Living, Birth Comments: Terminal MSAF  # 3 - Date: None, Sex: None, Weight: None, GA: None, Delivery: None, Apgar1: None, Apgar5: None, Living: None, Birth Comments: None   Past Gynecologic History:  Family History: Family History  Problem Relation Age of Onset  . Diabetes Mother   . Hypertension Mother   . Asthma Mother   . Asthma Brother   . Colon cancer Maternal Grandfather     Social History: Social History   Socioeconomic History  . Marital status: Single    Spouse name: Not on file  . Number of children: Not on file  . Years of education: Not on file  . Highest education level: Not on file  Occupational History  . Not on file  Social Needs  . Financial resource strain: Not on file  . Food insecurity    Worry: Patient refused    Inability: Patient refused  . Transportation needs    Medical: No    Non-medical: No  Tobacco Use  . Smoking status: Never Smoker  . Smokeless tobacco: Never Used  Substance and Sexual Activity  . Alcohol use: No  . Drug use: No  . Sexual activity: Yes  Lifestyle  . Physical activity    Days per week: Patient refused    Minutes per session: Patient refused  . Stress: Not at all  Relationships  . Social [place] phone: Patient refused    Gets together: Patient refused    Attends religious service: Patient refused    Active member of club or organization: Patient refused    Attends meetings of clubs or organizations: Patient refused    Relationship status: Patient refused  . Intimate partner violence    Fear of current or ex  partner: No    Emotionally abused: No    Physically abused: No    Forced sexual activity: No  Other Topics Concern  . Not on file  Social History Narrative  . Not on file    Medications: Prior to Admission medications   Medication Sig Start Date End Date Taking? Authorizing Provider  Accu-Chek FastClix Lancets MISC 1 Units by Percutaneous route 4 (four) times daily. 10/17/18  Yes HGae Dry MD  aspirin 81 MG chewable tablet Chew 1 tablet (81 mg  total) by mouth daily. 07/04/18  Yes Gatha Mayer, MD  Blood Glucose Monitoring Suppl (ACCU-CHEK NANO SMARTVIEW) w/Device KIT 1 kit by Subdermal route as directed. Check blood sugars for fasting, and two hours after breakfast, lunch and dinner (4 checks daily) 07/22/18  Yes Schuman, Christanna R, MD  glucose blood (ACCU-CHEK SMARTVIEW) test strip Use as instructed to check blood sugars 07/22/18  Yes Schuman, Christanna R, MD  Prenatal Vit-Fe Fumarate-FA (PRENATAL MULTIVITAMIN) TABS tablet Take 1 tablet by mouth daily at 12 noon.   Yes [provider]  valACYclovir (VALTREX) 500 MG tablet Take 1 tablet (500 mg total) by mouth 2 (two) times daily. 11/16/18  Yes Dalia Heading, CNM  ferrous sulfate (FERROUSUL) 325 (65 FE) MG tablet Take 1 tablet (325 mg total) by mouth 2 (two) times daily with a meal. Patient not taking: Reported on 12/13/2018 07/22/18   Homero Fellers, MD  nystatin-triamcinolone (MYCOLOG II) cream Apply 1 application topically 2 (two) times daily. Patient not taking: Reported on 12/13/2018 12/12/18   Homero Fellers, MD  omeprazole (PRILOSEC) 20 MG capsule Take 1 capsule (20 mg total) by mouth daily. Patient not taking: Reported on 12/13/2018 11/09/18   Rexene Agent, CNM  terconazole (TERAZOL 3) 0.8 % vaginal cream Place 1 applicator vaginally at bedtime. Patient not taking: Reported on 12/13/2018 11/07/18   Rod Can, CNM    Allergies: No Known Allergies  Physical Exam: Vitals: Blood  pressure 118/87, pulse 86, temperature 98.5 F (36.9 C), temperature source Oral, resp. rate 16, height '5\' 1"'  (1.549 m), weight 90.7 kg, last menstrual period 03/27/2018, SpO2 99 %, unknown if currently breastfeeding.  Urine Dip Protein: pending  FHT:  A: 130, moderate, +accels, no decels B: 140, moderate, +aaccels, no decels Toco: none  [place] confirmed vtx/vtx  General: NAD HEENT: normocephalic, anicteric Pulmonary: No increased work of breathing Cardiovascular: RRR, distal pulses 2+ Abdomen: Gravid, non-tender Genitourinary: 4/80-3 no lesions visualized on speculum exam  Extremities: no edema, erythema, or tenderness Neurologic: Grossly intact Psychiatric: mood appropriate, affect full  Labs: Results for orders placed or performed during the hospital encounter of 12/13/18 (from the past 24 hour(s))  Comprehensive metabolic panel     Status: Abnormal   Collection Time: 12/13/18  1:33 PM  Result Value Ref Range   Sodium 138 135 - 145 mmol/L   Potassium 2.8 (L) 3.5 - 5.1 mmol/L   Chloride 107 98 - 111 mmol/L   CO2 22 22 - 32 mmol/L   Glucose, Bld 123 (H) 70 - 99 mg/dL   BUN <5 (L) 6 - 20 mg/dL   Creatinine, Ser 0.73 0.44 - 1.00 mg/dL   Calcium 8.2 (L) 8.9 - 10.3 mg/dL   Total Protein 5.7 (L) 6.5 - 8.1 g/dL   Albumin 2.5 (L) 3.5 - 5.0 g/dL   AST 19 15 - 41 U/L   ALT 9 0 - 44 U/L   Alkaline Phosphatase 207 (H) 38 - 126 U/L   Total Bilirubin 0.7 0.3 - 1.2 mg/dL   GFR calc non Af Amer >60 >60 mL/min   GFR calc Af Amer >60 >60 mL/min   Anion gap 9 5 - 15  CBC on admission     Status: Abnormal   Collection Time: 12/13/18  1:33 PM  Result Value Ref Range   WBC 5.5 4.0 - 10.5 K/uL   RBC 3.99 3.87 - 5.11 MIL/uL   Hemoglobin 9.3 (L) 12.0 - 15.0 g/dL   HCT 29.0 (L) 36.0 - 46.0 %  MCV 72.7 (L) 80.0 - 100.0 fL   MCH 23.3 (L) 26.0 - 34.0 pg   MCHC 32.1 30.0 - 36.0 g/dL   RDW 17.2 (H) 11.5 - 15.5 %   Platelets 190 150 - 400 K/uL   nRBC 0.0 0.0 - 0.2 %  Type and screen [facility]     Status: None   Collection Time: 12/13/18  1:33 PM  Result Value Ref Range   ABO/RH(D) A POS    Antibody Screen NEG    Sample Expiration      12/16/2018,2359 Performed at [facility], [address] [place], [place] [address]   Protein / creatinine ratio, urine     Status: Abnormal   Collection Time: 12/13/18  1:59 PM  Result Value Ref Range   Creatinine, Urine 34 mg/dL   Total Protein, Urine 7 mg/dL   Protein Creatinine Ratio 0.21 (H) 0.00 - 0.15 mg/mg[Cre]    Assessment: 28 y.o. G3P1011 63w2dby 01/01/2019, by Last Menstrual Period   Plan: 1) GHTN - proceed with IOL given >37 weeks, IOL had been scheduled for tomorrow  2) Fetus - cat I tracing on A and B - had elevated 1-hr with reportedly normal logs.  Not on medicine for GDM and not given diagnosis of GDM.  CMP here today with BG of 121.  Will monitor CBG in labor cover with sliding scale as necessary  3) PNL - Blood type --/--/A POS (12/01 1333) / Anti-bodyscreen NEG (12/01 1333) / Rubella 3.27 (06/26 1530) / Varicella Immune / RPR NON REACTIVE (10/21 2040) / HBsAg Negative (06/26 1530) / HIV Non Reactive (06/26 1530) / 1-hr OGTT 187  / GBS --/Positive (11/19 1645)  4) Immunization History -  Immunization History  Administered Date(s) Administered  . Tdap 10/31/2018    5) Disposition - pending delivery  AMalachy Mood MD, [facility] 12/13/2018, 4:53 PM

## 2018-12-13 NOTE — Anesthesia Procedure Notes (Signed)
Epidural Patient location during procedure: OB  Staffing Performed: anesthesiologist and other anesthesia staff   Preanesthetic Checklist Completed: patient identified, site marked, surgical consent, pre-op evaluation, timeout performed, IV checked, risks and benefits discussed and monitors and equipment checked  Epidural Patient position: sitting Prep: Betadine Patient monitoring: heart rate, continuous pulse ox and blood pressure Approach: midline Location: L4-L5 Injection technique: LOR saline  Needle:  Needle type: Tuohy  Needle gauge: 17 G Needle length: 9 cm and 9 Needle insertion depth: 8 cm Catheter type: closed end flexible Catheter size: 19 Gauge Catheter at skin depth: 13 cm Test dose: negative and 1.5% lidocaine with Epi 1:200 K  Assessment Sensory level: T10 Events: blood not aspirated, injection not painful, no injection resistance, negative IV test and no paresthesia  Additional Notes Very difficult epidural secondary to scoliotic changes.  2 attempt by the crna without success.  A third pass yielded loss of resistance to saline at 7cm and passage of catheter without difficulty.  No paresthesia or evidence of sub arach sx.  JA  Patient tolerated the insertion well without complications.-SATD -IVTD. No paresthesia. Refer to [place] for VS and dosingReason for block:procedure for pain

## 2018-12-14 LAB — CBC
HCT: 26.6 % — ABNORMAL LOW (ref 36.0–46.0)
HCT: 21.8 % — ABNORMAL LOW (ref 36.0–46.0)
Hemoglobin: 8.5 g/dL — ABNORMAL LOW (ref 12.0–15.0)
Hemoglobin: 7.3 g/dL — ABNORMAL LOW (ref 12.0–15.0)
MCH: 23.2 pg — ABNORMAL LOW (ref 26.0–34.0)
MCH: 23.2 pg — ABNORMAL LOW (ref 26.0–34.0)
MCHC: 32 g/dL (ref 30.0–36.0)
MCHC: 33.5 g/dL (ref 30.0–36.0)
MCV: 72.7 fL — ABNORMAL LOW (ref 80.0–100.0)
MCV: 69.4 fL — ABNORMAL LOW (ref 80.0–100.0)
Platelets: 186 10*3/uL (ref 150–400)
Platelets: 162 10*3/uL (ref 150–400)
RBC: 3.66 MIL/uL — ABNORMAL LOW (ref 3.87–5.11)
RBC: 3.14 MIL/uL — ABNORMAL LOW (ref 3.87–5.11)
RDW: 17.2 % — ABNORMAL HIGH (ref 11.5–15.5)
RDW: 17.2 % — ABNORMAL HIGH (ref 11.5–15.5)
WBC: 11.5 10*3/uL — ABNORMAL HIGH (ref 4.0–10.5)
WBC: 8.9 10*3/uL (ref 4.0–10.5)
nRBC: 0 % (ref 0.0–0.2)
nRBC: 0 % (ref 0.0–0.2)

## 2018-12-14 LAB — RPR: RPR Ser Ql: NONREACTIVE

## 2018-12-14 LAB — GLUCOSE, CAPILLARY: Glucose-Capillary: 62 mg/dL — ABNORMAL LOW (ref 70–99)

## 2018-12-14 LAB — PREPARE RBC (CROSSMATCH)

## 2018-12-14 MED ORDER — MISOPROSTOL 200 MCG PO TABS
ORAL_TABLET | ORAL | Status: AC
  Administered 2018-12-14: 800 ug via RECTAL
  Filled 2018-12-14: qty 4

## 2018-12-14 MED ORDER — HYDROXYZINE HCL 25 MG PO TABS
25.0000 mg | ORAL_TABLET | Freq: Four times a day (QID) | ORAL | Status: DC | PRN
  Administered 2018-12-14 – 2018-12-16 (×2): 25 mg via ORAL
  Filled 2018-12-14 (×2): qty 1

## 2018-12-14 MED ORDER — ONDANSETRON HCL 4 MG/2ML IJ SOLN: 4.0000 mg | INTRAMUSCULAR | Status: DC | PRN

## 2018-12-14 MED ORDER — BENZOCAINE-MENTHOL 20-0.5 % EX AERO
1.0000 "application " | INHALATION_SPRAY | CUTANEOUS | Status: DC | PRN
  Administered 2018-12-14: 1 via TOPICAL
  Filled 2018-12-14: qty 56

## 2018-12-14 MED ORDER — WITCH HAZEL-GLYCERIN EX PADS
1.0000 "application " | MEDICATED_PAD | CUTANEOUS | Status: DC | PRN
  Filled 2018-12-14: qty 100

## 2018-12-14 MED ORDER — FENTANYL 2.5 MCG/ML W/ROPIVACAINE 0.15% IN NS 100 ML EPIDURAL (ARMC)
EPIDURAL | Status: AC
  Filled 2018-12-14: qty 100

## 2018-12-14 MED ORDER — TRANEXAMIC ACID-NACL 1000-0.7 MG/100ML-% IV SOLN
1000.0000 mg | Freq: Once | INTRAVENOUS | Status: DC
  Administered 2018-12-14: 1000 mg via INTRAVENOUS
  Filled 2018-12-14: qty 100

## 2018-12-14 MED ORDER — ONDANSETRON HCL 4 MG PO TABS: 4.0000 mg | ORAL_TABLET | ORAL | Status: DC | PRN

## 2018-12-14 MED ORDER — METHYLERGONOVINE MALEATE 0.2 MG/ML IJ SOLN
0.2000 mg | Freq: Once | INTRAMUSCULAR | Status: AC
  Administered 2018-12-14: 0.2 mg via INTRAMUSCULAR

## 2018-12-14 MED ORDER — METHYLERGONOVINE MALEATE 0.2 MG/ML IJ SOLN
INTRAMUSCULAR | Status: AC
  Filled 2018-12-14: qty 1

## 2018-12-14 MED ORDER — DIBUCAINE (PERIANAL) 1 % EX OINT: 1.0000 "application " | TOPICAL_OINTMENT | CUTANEOUS | Status: DC | PRN

## 2018-12-14 MED ORDER — LIDOCAINE HCL (PF) 1 % IJ SOLN
INTRAMUSCULAR | Status: AC
  Filled 2018-12-14: qty 30

## 2018-12-14 MED ORDER — OXYCODONE-ACETAMINOPHEN 5-325 MG PO TABS
2.0000 | ORAL_TABLET | ORAL | Status: DC | PRN
  Administered 2018-12-14: 2 via ORAL

## 2018-12-14 MED ORDER — HYDROCERIN EX CREA
TOPICAL_CREAM | Freq: Three times a day (TID) | CUTANEOUS | Status: DC
  Administered 2018-12-14 – 2018-12-18 (×6): via TOPICAL
  Filled 2018-12-14: qty 113

## 2018-12-14 MED ORDER — SIMETHICONE 80 MG PO CHEW: 80.0000 mg | CHEWABLE_TABLET | ORAL | Status: DC | PRN

## 2018-12-14 MED ORDER — TRIAMCINOLONE ACETONIDE 0.1 % EX CREA
TOPICAL_CREAM | Freq: Three times a day (TID) | CUTANEOUS | Status: DC
  Administered 2018-12-14 – 2018-12-18 (×6): via TOPICAL
  Filled 2018-12-14: qty 15

## 2018-12-14 MED ORDER — PRENATAL MULTIVITAMIN CH
1.0000 | ORAL_TABLET | Freq: Every day | ORAL | Status: DC
  Administered 2018-12-15 – 2018-12-18 (×4): 1 via ORAL
  Filled 2018-12-14 (×3): qty 1

## 2018-12-14 MED ORDER — AMMONIA AROMATIC IN INHA
RESPIRATORY_TRACT | Status: AC
  Filled 2018-12-14: qty 10

## 2018-12-14 MED ORDER — DIPHENHYDRAMINE HCL 25 MG PO CAPS
25.0000 mg | ORAL_CAPSULE | Freq: Four times a day (QID) | ORAL | Status: DC | PRN
  Administered 2018-12-14: 25 mg via ORAL
  Filled 2018-12-14: qty 1

## 2018-12-14 MED ORDER — OXYCODONE-ACETAMINOPHEN 5-325 MG PO TABS
ORAL_TABLET | ORAL | Status: AC
  Filled 2018-12-14: qty 2

## 2018-12-14 MED ORDER — OXYTOCIN 10 UNIT/ML IJ SOLN
INTRAMUSCULAR | Status: AC
  Filled 2018-12-14: qty 2

## 2018-12-14 MED ORDER — SENNOSIDES-DOCUSATE SODIUM 8.6-50 MG PO TABS
2.0000 | ORAL_TABLET | ORAL | Status: DC
  Administered 2018-12-14 – 2018-12-18 (×3): 2 via ORAL
  Filled 2018-12-14 (×3): qty 2

## 2018-12-14 MED ORDER — COCONUT OIL OIL: 1.0000 "application " | TOPICAL_OIL | Status: DC | PRN

## 2018-12-14 MED ORDER — OXYTOCIN 40 UNITS IN NORMAL SALINE INFUSION - SIMPLE MED
2.5000 [IU]/h | INTRAVENOUS | Status: DC
  Administered 2018-12-15: 2.5 [IU]/h via INTRAVENOUS
  Filled 2018-12-14: qty 1000

## 2018-12-14 MED ORDER — ACETAMINOPHEN 325 MG PO TABS
650.0000 mg | ORAL_TABLET | ORAL | Status: DC | PRN
  Administered 2018-12-14 – 2018-12-17 (×8): 650 mg via ORAL
  Filled 2018-12-14 (×8): qty 2

## 2018-12-14 MED ORDER — TRIAMCINOLONE 0.1 % CREAM:EUCERIN CREAM 1:1: TOPICAL_CREAM | Freq: Three times a day (TID) | CUTANEOUS | Status: DC

## 2018-12-14 MED ORDER — OXYCODONE-ACETAMINOPHEN 5-325 MG PO TABS
1.0000 | ORAL_TABLET | ORAL | Status: DC | PRN
  Administered 2018-12-14: 1 via ORAL
  Filled 2018-12-14: qty 1

## 2018-12-14 MED ORDER — IBUPROFEN 600 MG PO TABS
600.0000 mg | ORAL_TABLET | Freq: Four times a day (QID) | ORAL | Status: DC
  Administered 2018-12-14 – 2018-12-18 (×17): 600 mg via ORAL
  Filled 2018-12-14 (×17): qty 1

## 2018-12-14 NOTE — Progress Notes (Signed)
Patient VS stable, Alert and oriented x4 without dizziness when ambulating. Weakness noted. Gutierrez notified of generalized 3+ pitting edema with weeping noted on Right Lower extremity shin area, notified of clots during urination. Verbal order given to continue Oxytocin and discontinue carrier fluids. Lung sounds clear at this time. Creams applied as ordered.  Will continue to monitor frequently.

## 2018-12-14 NOTE — Progress Notes (Signed)
Subjective:  Comfortable epidural in place  Objective:   Vitals: Blood pressure (!) 157/90, pulse 71, temperature 98.6 F (37 C), temperature source Oral, resp. rate 16, height 5\' 1"  (1.549 m), weight 90.7 kg, last menstrual period 03/27/2018, SpO2 100 %, unknown if currently breastfeeding. General: NAD Abdomen: gravid, non-tender Cervical Exam:  Dilation: 5 Effacement (%): 90 Station: -2 Presentation: Vertex Exam by:: Georgianne Fick, MD  FHT:  A: 742. Moderate, +accels, no decels B: 145, moderate, + accels, no decels Toco: q1-32min  Results for orders placed or performed during the hospital encounter of 12/13/18 (from the past 24 hour(s))  Comprehensive metabolic panel     Status: Abnormal   Collection Time: 12/13/18  1:33 PM  Result Value Ref Range   Sodium 138 135 - 145 mmol/L   Potassium 2.8 (L) 3.5 - 5.1 mmol/L   Chloride 107 98 - 111 mmol/L   CO2 22 22 - 32 mmol/L   Glucose, Bld 123 (H) 70 - 99 mg/dL   BUN <5 (L) 6 - 20 mg/dL   Creatinine, Ser 0.73 0.44 - 1.00 mg/dL   Calcium 8.2 (L) 8.9 - 10.3 mg/dL   Total Protein 5.7 (L) 6.5 - 8.1 g/dL   Albumin 2.5 (L) 3.5 - 5.0 g/dL   AST 19 15 - 41 U/L   ALT 9 0 - 44 U/L   Alkaline Phosphatase 207 (H) 38 - 126 U/L   Total Bilirubin 0.7 0.3 - 1.2 mg/dL   GFR calc non Af Amer >60 >60 mL/min   GFR calc Af Amer >60 >60 mL/min   Anion gap 9 5 - 15  CBC on admission     Status: Abnormal   Collection Time: 12/13/18  1:33 PM  Result Value Ref Range   WBC 5.5 4.0 - 10.5 K/uL   RBC 3.99 3.87 - 5.11 MIL/uL   Hemoglobin 9.3 (L) 12.0 - 15.0 g/dL   HCT 29.0 (L) 36.0 - 46.0 %   MCV 72.7 (L) 80.0 - 100.0 fL   MCH 23.3 (L) 26.0 - 34.0 pg   MCHC 32.1 30.0 - 36.0 g/dL   RDW 17.2 (H) 11.5 - 15.5 %   Platelets 190 150 - 400 K/uL   nRBC 0.0 0.0 - 0.2 %  Type and screen [facility]     Status: None (Preliminary result)   Collection Time: 12/13/18  1:33 PM  Result Value Ref Range   ABO/RH(D) A POS    Antibody Screen  NEG    Sample Expiration      12/16/2018,2359 Performed at [facility], [address] [place], [place] [address]    Unit Number [id number]    Blood Component Type RED CELLS,LR    Unit division 00    Status of Unit ALLOCATED    Transfusion Status OK TO TRANSFUSE    Crossmatch Result Compatible    Unit Number [id number]    Blood Component Type RBC LR PHER1    Unit division 00    Status of Unit ALLOCATED    Transfusion Status OK TO TRANSFUSE    Crossmatch Result Compatible   Protein / creatinine ratio, urine     Status: Abnormal   Collection Time: 12/13/18  1:59 PM  Result Value Ref Range   Creatinine, Urine 34 mg/dL   Total Protein, Urine 7 mg/dL   Protein Creatinine Ratio 0.21 (H) 0.00 - 0.15 mg/mg[Cre]  Prepare RBC     Status: None   Collection Time: 12/13/18  4:52  PM  Result Value Ref Range   Order Confirmation      ORDER PROCESSED BY BLOOD BANK Performed at [facility], [address] [place], [place] [address]   Glucose, capillary     Status: None   Collection Time: 12/13/18 10:31 PM  Result Value Ref Range   Glucose-Capillary 72 70 - 99 mg/dL    Assessment:   28 y.o. G3P1011 [redacted]w[redacted]d IOL GHTN, Mo/Di twin pregnancy  Plan:   1) Labor - continue pitocin, AROM clear  2) Fetus - Cat I tracings  3) GHTN - mild range to normotensive throughout admission, asymptomatic  Vena Austria, MD, [facility], [facility] 12/14/2018, 12:03 AM

## 2018-12-14 NOTE — Progress Notes (Signed)
Post Partum Day 0 12 hours postpartum. Subjective: Complains of itchy rash on inner thighs, lower abdomen and arms. This was present prior to admission. Given Benadryl earlier this AM without relief of itching. Just received first dose of Atarax. Has been up to commode in room and voided large amoubnt of urine. Nurses report normal lochia. Minimal cramping. Tolerating regular diet.  Objective: Blood pressure 137/83, pulse 93, temperature 98.8 F (37.1 C), temperature source Axillary, resp. rate 18, height 5\' 1"  (1.549 m), weight 90.7 kg, last menstrual period 03/27/2018, SpO2 99 %, unknown if currently breastfeeding. Blood pressures since delivery are trending down. Mild range to normal blood pressures. Physical Exam:  General: sleepy, answering questions appropriately  Heart: RRR without murmur Lungs: normal respiratory effort, CTAB Lochia: appropriate Uterine Fundus: firm/ ML/NT/ 1-2 FB above U Skin: +1 edema in lower extremities and in hands, and lower abdomen. Fine macular rash on inner thighs and lower abdomen below umbilicus, and upper arms DVT Evaluation: No evidence of DVT seen on physical exam.  Recent Labs    12/14/18 0656 12/14/18 1651  HGB 8.5* 7.3*  HCT 26.6* 21.8*  WBC 11.5* 8.9  PLT 186 162    Assessment/Plan:  PPD 0 (12 hours pp)  Chronic anemia worsened with acute blood loss-hemodynamically stable and no lightheadedness when OOB, but very weak/ tired.  Will treat with iron and vitamins for now  Repeat CBC in AM  OOB with assist Pruritic rash: ? Atypical presentation for PUPPS-continue Atarax for now. Trial of Kenalog in Eucerin base on inner thighs. Will discuss getting a dermatology consult vs using a Prednisone taper if not responding to current treatment  Hypokalemia/ GDM: repeat BMP in AM.  Bottle feeding  A POS/ RI/ VI  Contraception: unsure  TDAP UTD   LOS: 1 day   Dalia Heading 12/14/2018, 5:41 PM

## 2018-12-14 NOTE — Progress Notes (Signed)
Patient passes several clots in the delivery room approximately 468mL.  Will start Lysteda 1g IV once as well as methergine.  Continue postpartum pitocin.

## 2018-12-15 LAB — BASIC METABOLIC PANEL
Anion gap: 7 (ref 5–15)
BUN: 6 mg/dL (ref 6–20)
CO2: 23 mmol/L (ref 22–32)
Calcium: 8 mg/dL — ABNORMAL LOW (ref 8.9–10.3)
Chloride: 107 mmol/L (ref 98–111)
Creatinine, Ser: 0.73 mg/dL (ref 0.44–1.00)
GFR calc Af Amer: >60 mL/min (ref >60)
GFR calc non Af Amer: >60 mL/min (ref >60)
Glucose, Bld: 84 mg/dL (ref 70–99)
Potassium: 2.8 mmol/L — ABNORMAL LOW (ref 3.5–5.1)
Sodium: 137 mmol/L (ref 135–145)

## 2018-12-15 LAB — CBC
HCT: 19 % — ABNORMAL LOW (ref 36.0–46.0)
Hemoglobin: 6.1 g/dL — ABNORMAL LOW (ref 12.0–15.0)
MCH: 23.2 pg — ABNORMAL LOW (ref 26.0–34.0)
MCHC: 32.1 g/dL (ref 30.0–36.0)
MCV: 72.2 fL — ABNORMAL LOW (ref 80.0–100.0)
Platelets: 151 10*3/uL (ref 150–400)
RBC: 2.63 MIL/uL — ABNORMAL LOW (ref 3.87–5.11)
RDW: 17.2 % — ABNORMAL HIGH (ref 11.5–15.5)
WBC: 9.2 10*3/uL (ref 4.0–10.5)
nRBC: 0 % (ref 0.0–0.2)

## 2018-12-15 LAB — PREPARE RBC (CROSSMATCH)

## 2018-12-15 LAB — SURGICAL PATHOLOGY

## 2018-12-15 MED ORDER — PREDNISONE 10 MG (21) PO TBPK
20.0000 mg | ORAL_TABLET | Freq: Every evening | ORAL | Status: AC
  Administered 2018-12-15: 20 mg via ORAL

## 2018-12-15 MED ORDER — SODIUM CHLORIDE 0.9% IV SOLUTION
Freq: Once | INTRAVENOUS | Status: AC
  Administered 2018-12-15: 10:00:00 via INTRAVENOUS

## 2018-12-15 MED ORDER — POTASSIUM CHLORIDE CRYS ER 20 MEQ PO TBCR
20.0000 meq | EXTENDED_RELEASE_TABLET | Freq: Three times a day (TID) | ORAL | Status: DC
  Administered 2018-12-15 (×3): 20 meq via ORAL
  Filled 2018-12-15: qty 1

## 2018-12-15 MED ORDER — PREDNISONE 10 MG (21) PO TBPK
10.0000 mg | ORAL_TABLET | Freq: Four times a day (QID) | ORAL | Status: DC
  Administered 2018-12-17 – 2018-12-18 (×6): 10 mg via ORAL

## 2018-12-15 MED ORDER — PREDNISONE 10 MG (21) PO TBPK
10.0000 mg | ORAL_TABLET | ORAL | Status: AC
  Administered 2018-12-15: 10 mg via ORAL

## 2018-12-15 MED ORDER — PREDNISONE 10 MG (21) PO TBPK
20.0000 mg | ORAL_TABLET | Freq: Every morning | ORAL | Status: AC
  Administered 2018-12-15: 20 mg via ORAL
  Filled 2018-12-15: qty 21

## 2018-12-15 MED ORDER — PREDNISONE 10 MG (21) PO TBPK
10.0000 mg | ORAL_TABLET | Freq: Three times a day (TID) | ORAL | Status: AC
  Administered 2018-12-16 (×3): 10 mg via ORAL

## 2018-12-15 MED ORDER — FUROSEMIDE 10 MG/ML IJ SOLN
20.0000 mg | Freq: Once | INTRAMUSCULAR | Status: AC
  Administered 2018-12-15: 20 mg via INTRAVENOUS
  Filled 2018-12-15: qty 2

## 2018-12-15 MED ORDER — PREDNISONE 10 MG (21) PO TBPK
20.0000 mg | ORAL_TABLET | Freq: Every evening | ORAL | Status: AC
  Administered 2018-12-16: 20 mg via ORAL

## 2018-12-15 NOTE — Progress Notes (Signed)
Subjective:  Postpartum day 1: "I don't feel good because of the itching. My back hurts. I feel tired." Patient is tolerating regular diet. Her pain is controlled with PO medication. She has not been ambulating independently. She denies feeling lightheaded or dizzy when she is out of bed. She is voiding without difficulty. We discussed plan of care: steroid taper to start this morning. Plan to administer 2 units PRBC with lasix given in between units.   Objective:  Vital signs in last 24 hours: Temp:  [98.1 F (36.7 C)-99.2 F (37.3 C)] 98.3 F (36.8 C) (12/03 0925) Pulse Rate:  [86-105] 98 (12/03 0925) Resp:  [16-20] 16 (12/03 0925) BP: (121-151)/(83-95) 145/95 (12/03 0925) SpO2:  [97 %-100 %] 100 % (12/03 0925)    General: NAD Pulmonary: no increased work of breathing Abdomen: non-distended, non-tender, fundus firm at level of umbilicus Extremities: no edema, no erythema, no tenderness  Results for orders placed or performed during the hospital encounter of 12/13/18 (from the past 72 hour(s))  Comprehensive metabolic panel     Status: Abnormal   Collection Time: 12/13/18  1:33 PM  Result Value Ref Range   Sodium 138 135 - 145 mmol/L   Potassium 2.8 (L) 3.5 - 5.1 mmol/L   Chloride 107 98 - 111 mmol/L   CO2 22 22 - 32 mmol/L   Glucose, Bld 123 (H) 70 - 99 mg/dL   BUN <5 (L) 6 - 20 mg/dL   Creatinine, Ser 0.73 0.44 - 1.00 mg/dL   Calcium 8.2 (L) 8.9 - 10.3 mg/dL   Total Protein 5.7 (L) 6.5 - 8.1 g/dL   Albumin 2.5 (L) 3.5 - 5.0 g/dL   AST 19 15 - 41 U/L   ALT 9 0 - 44 U/L   Alkaline Phosphatase 207 (H) 38 - 126 U/L   Total Bilirubin 0.7 0.3 - 1.2 mg/dL   GFR calc non Af Amer >60 >60 mL/min   GFR calc Af Amer >60 >60 mL/min   Anion gap 9 5 - 15    Comment: Performed at [facility], [address] [place], [place] [address]  CBC on admission     Status: Abnormal   Collection Time: 12/13/18  1:33 PM  Result Value Ref Range   WBC 5.5 4.0 - 10.5 K/uL   RBC 3.99  3.87 - 5.11 MIL/uL   Hemoglobin 9.3 (L) 12.0 - 15.0 g/dL   HCT 29.0 (L) 36.0 - 46.0 %   MCV 72.7 (L) 80.0 - 100.0 fL   MCH 23.3 (L) 26.0 - 34.0 pg   MCHC 32.1 30.0 - 36.0 g/dL   RDW 17.2 (H) 11.5 - 15.5 %   Platelets 190 150 - 400 K/uL   nRBC 0.0 0.0 - 0.2 %    Comment: Performed at [facility], [address] [place], [place] [address]  RPR     Status: None   Collection Time: 12/13/18  1:33 PM  Result Value Ref Range   RPR Ser Ql NON REACTIVE NON REACTIVE    Comment: Performed at [facility], [place] [address], [place], [place] [address]  Type and screen [place]     Status: None (Preliminary result)   Collection Time: 12/13/18  1:33 PM  Result Value Ref Range   ABO/RH(D) A POS    Antibody Screen NEG    Sample Expiration 12/16/2018,2359    Unit Number [id number]    Blood Component Type RED CELLS,LR  Unit division 00    [facility]    Trans[facility] Status OK TO TRANSFUSE    Crossmatch Result Compatible    Unit Number [id number]    Blood Component Type RBC LR PHER1    Unit division 00    [facility]    Transfusion Status OK TO TRANSFUSE    Crossmatch Result Compatible    Unit Number [id number]    Blood Component Type RED CELLS,LR    Unit division 00    Status of Unit ISSUED    Transfusion Status OK TO TRANSFUSE    Crossmatch Result      Compatible Performed at [facility], [address] [place], [place] [address]    Unit Number [id number]    Blood Component Type RED CELLS,LR    Unit division 00    Status of Unit ALLOCATED    Transfusion Status OK TO TRANSFUSE    Crossmatch Result Compatible   Protein / creatinine ratio, urine     Status: Abnormal   Collection Time: 12/13/18  1:59 PM  Result Value Ref Range   Creatinine, Urine 34 mg/dL   Total Protein, Urine 7 mg/dL    Comment: NO NORMAL RANGE ESTABLISHED FOR THIS TEST   Protein Creatinine Ratio 0.21 (H) 0.00 - 0.15  mg/mg[Cre]    Comment: Performed at [facility], [address] [place], [place] [address]  Prepare RBC     Status: None   Collection Time: 12/13/18  4:52 PM  Result Value Ref Range   Order Confirmation      ORDER PROCESSED BY BLOOD BANK Performed at [facility], [address] [place], [place] [address]   Glucose, capillary     Status: None   Collection Time: 12/13/18 10:31 PM  Result Value Ref Range   Glucose-Capillary 72 70 - 99 mg/dL  Glucose, capillary     Status: Abnormal   Collection Time: 12/14/18  3:22 AM  Result Value Ref Range   Glucose-Capillary 62 (L) 70 - 99 mg/dL  CBC     Status: Abnormal   Collection Time: 12/14/18  6:56 AM  Result Value Ref Range   WBC 11.5 (H) 4.0 - 10.5 K/uL   RBC 3.66 (L) 3.87 - 5.11 MIL/uL   Hemoglobin 8.5 (L) 12.0 - 15.0 g/dL    Comment: Reticulocyte Hemoglobin testing may be clinically indicated, consider ordering this additional test [id number]    HCT 26.6 (L) 36.0 - 46.0 %   MCV 72.7 (L) 80.0 - 100.0 fL   MCH 23.2 (L) 26.0 - 34.0 pg   MCHC 32.0 30.0 - 36.0 g/dL   RDW 32.917.2 (H) 51.811.5 - 84.115.5 %   Platelets 186 150 - 400 K/uL   nRBC 0.0 0.0 - 0.2 %    Comment: Performed at [facility], [address] [place], [place] [address]  CBC     Status: Abnormal   Collection Time: 12/14/18  4:51 PM  Result Value Ref Range   WBC 8.9 4.0 - 10.5 K/uL   RBC 3.14 (L) 3.87 - 5.11 MIL/uL   Hemoglobin 7.3 (L) 12.0 - 15.0 g/dL    Comment: Reticulocyte Hemoglobin testing may be clinically indicated, consider ordering this additional test [id number]    HCT 21.8 (L) 36.0 - 46.0 %   MCV 69.4 (L) 80.0 - 100.0 fL   MCH 23.2 (L) 26.0 - 34.0 pg   MCHC 33.5 30.0 - 36.0 g/dL  RDW 17.2 (H) 11.5 - 15.5 %   Platelets 162 150 - 400 K/uL   nRBC 0.0 0.0 - 0.2 %    Comment: Performed at [facility], [address] [place], [place] [address]  CBC     Status: Abnormal   Collection Time: 12/15/18  6:18 AM  Result Value  Ref Range   WBC 9.2 4.0 - 10.5 K/uL   RBC 2.63 (L) 3.87 - 5.11 MIL/uL   Hemoglobin 6.1 (L) 12.0 - 15.0 g/dL    Comment: Reticulocyte Hemoglobin testing may be clinically indicated, consider ordering this additional test [id number]    HCT 19.0 (L) 36.0 - 46.0 %   MCV 72.2 (L) 80.0 - 100.0 fL   MCH 23.2 (L) 26.0 - 34.0 pg   MCHC 32.1 30.0 - 36.0 g/dL   RDW 26.2 (H) 03.5 - 59.7 %   Platelets 151 150 - 400 K/uL   nRBC 0.0 0.0 - 0.2 %    Comment: Performed at [facility], [address] [place], [place] [address]  Basic metabolic panel     Status: Abnormal   Collection Time: 12/15/18  6:18 AM  Result Value Ref Range   Sodium 137 135 - 145 mmol/L   Potassium 2.8 (L) 3.5 - 5.1 mmol/L   Chloride 107 98 - 111 mmol/L   CO2 23 22 - 32 mmol/L   Glucose, Bld 84 70 - 99 mg/dL   BUN 6 6 - 20 mg/dL   Creatinine, Ser 4.53 0.44 - 1.00 mg/dL   Calcium 8.0 (L) 8.9 - 10.3 mg/dL   GFR calc non Af Amer >60 >60 mL/min   GFR calc Af Amer >60 >60 mL/min   Anion gap 7 5 - 15    Comment: Performed at [facility], [address], [place], [place] [address]  Prepare RBC     Status: None   Collection Time: 12/15/18  7:54 AM  Result Value Ref Range   Order Confirmation      ORDER PROCESSED BY BLOOD BANK Performed at [facility], [address], [place], [place] [address]     Assessment:   28 y.o. G3P1011 postpartum day # 1  Plan:    1) Acute blood loss anemia - hemodynamically stable and asymptomatic Two units PRBC to be administered this morning per orders with lasix between units  2) Itching: start steroid taper per orders  3) Hypokalemia: potassium repletion per orders  4) Blood Type --/--/A POS (12/01 1333) / Rubella 3.27 (06/26 1530) / Varicella Immune  5) TDAP status up to date  6) Feeding plan formula  7)  Education given regarding options for contraception, as well as compatibility with breast feeding if applicable.  Patient plans on Nexplanon or  IUD for contraception. She is advised to inform WS of choice prior to 6 week visit so it will be there.   8) Disposition: Continue current care   [name], CNM [facility] 12/15/2018, 9:28 AM

## 2018-12-15 NOTE — Plan of Care (Signed)
Patient very sleepy throughout shift; encouraged to feed infants multiple times; Nurse fed both infants x1 and prompted mother many times to stay on schedule with feedings. Father in room but sleepy also. VS stable, generalized edema continues. Progressing up to toilet instead of BSC.

## 2018-12-15 NOTE — Anesthesia Postprocedure Evaluation (Signed)
Anesthesia Post Note  Patient: Katelyn Mendoza  Procedure(s) Performed: AN AD HOC LABOR EPIDURAL  Patient location during evaluation: Mother Baby Anesthesia Type: Epidural Level of consciousness: awake and alert Pain management: pain level controlled Vital Signs Assessment: post-procedure vital signs reviewed and stable Respiratory status: spontaneous breathing, nonlabored ventilation and respiratory function stable Cardiovascular status: stable Postop Assessment: no headache, no backache and epidural receding Anesthetic complications: no     Last Vitals:  Vitals:   12/15/18 0925 12/15/18 1000  BP: (!) 145/95 138/90  Pulse: 98 89  Resp: 16 18  Temp: 36.8 C 36.8 C  SpO2: 100% 100%    Last Pain:  Vitals:   12/15/18 1000  TempSrc: Oral  PainSc:                  Alison Stalling

## 2018-12-15 NOTE — Progress Notes (Addendum)
Post Partum Day 1 Subjective: "I am itching on my buttock, my ankles, my abdomen and my inner thighs." Itchy rash made it hard to sleep. Has a history of eczema. Denies any recent changes in soaps or detergents or lotions. Not lightheaded when OOB. Passed a large clot last night around 9PM, but bleeding since then has been normal. Voiding without difficulty, but needs help getting up out of bed. Tolerating a regular diet.  Objective: Blood pressure 139/89, pulse 86, temperature 98.4 F (36.9 C), temperature source Oral, resp. rate 20, height 5\' 1"  (1.549 m), weight 90.7 kg, last menstrual period 03/27/2018, SpO2 100 %, unknown if currently breastfeeding. Patient Vitals for the past 24 hrs:  BP Temp Temp src Pulse Resp SpO2  12/15/18 0756 139/89 98.4 F (36.9 C) Oral 86 20 100 %  12/15/18 0408 (!) 151/88 98.1 F (36.7 C) Oral 98 18 97 %  12/14/18 2341 138/89 98.5 F (36.9 C) Oral (!) 105 18 97 %  12/14/18 1613 137/83 98.8 F (37.1 C) Axillary 93 18 -  12/14/18 1300 121/90 99.2 F (37.3 C) Oral 89 19 99 %  12/14/18 1200 (!) 135/93 98.4 F (36.9 C) Oral 86 20 100 %  12/14/18 1115 - 98.3 F (36.8 C) Oral - - -   Urine output was >1500 ml.  Physical Exam:  General: alert, cooperative and no distress  Heart: RRR without murmur Lungs: CTAB/ normal respiratory effort Lochia: appropriate Uterine Fundus: firm, but at U+2 Skin: fine macular rash on lower abdomen, lower buttock, ankles, upper arms, inner thighs. Edema of the lower extremities, lower abdomen DVT Evaluation: No evidence of DVT seen on physical exam.  Recent Labs    12/14/18 1651 12/15/18 0618  HGB 7.3* 6.1*  HCT 21.8* 19.0*  WBC 8.9 9.2  PLT 162 151    Assessment/Plan: PPD #1 s/p vaginal delivery of twins/ PPH Chronic anemia worsened with acute blood loss-hemodynamically stable and no lightheadedness when OOB, but very weak/ tired.             Blood transfusion of 2 U PRBCs. Give Lasix between units.  Continue iron  and vitamins              Repeat CBC after transfusion  Stop Pitocin infusion.             OOB with assist Pruritic rash: ? Atypical presentation for PUPPS-continue Atarax Trial of Kenalog and  Eucerin base on inner thighs and Eucerin on the other pruritic areas has not helped very much. Start Prednisone taper  Gestational hypertension: normal to mild range blood pressures-continue to monitor every 4 hours.  Hypokalemia/ GDM: Potassium this AM still low at 2.8. Start po potassium replacement. Glucose this morning was84.  Bottle feeding  A POS/ RI/ VI  Contraception: unsure  TDAP UTD  LOS: 2 days   Dalia Heading 12/15/2018, 7:59 AM

## 2018-12-16 LAB — TYPE AND SCREEN
ABO/RH(D): A POS
Antibody Screen: NEGATIVE
Unit division: 0
Unit division: 0
Unit division: 0
Unit division: 0

## 2018-12-16 LAB — CBC WITH DIFFERENTIAL/PLATELET
Abs Immature Granulocytes: 0.44 10*3/uL — ABNORMAL HIGH (ref 0.00–0.07)
Basophils Absolute: 0 10*3/uL (ref 0.0–0.1)
Basophils Relative: 0 %
Eosinophils Absolute: 0 10*3/uL (ref 0.0–0.5)
Eosinophils Relative: 0 %
HCT: 25.8 % — ABNORMAL LOW (ref 36.0–46.0)
Hemoglobin: 8.7 g/dL — ABNORMAL LOW (ref 12.0–15.0)
Immature Granulocytes: 3 %
Lymphocytes Relative: 12 %
Lymphs Abs: 1.7 10*3/uL (ref 0.7–4.0)
MCH: 24.2 pg — ABNORMAL LOW (ref 26.0–34.0)
MCHC: 33.7 g/dL (ref 30.0–36.0)
MCV: 71.9 fL — ABNORMAL LOW (ref 80.0–100.0)
Monocytes Absolute: 0.8 10*3/uL (ref 0.1–1.0)
Monocytes Relative: 5 %
Neutro Abs: 11.9 10*3/uL — ABNORMAL HIGH (ref 1.7–7.7)
Neutrophils Relative %: 80 %
Platelets: 208 10*3/uL (ref 150–400)
RBC: 3.59 MIL/uL — ABNORMAL LOW (ref 3.87–5.11)
RDW: 17.9 % — ABNORMAL HIGH (ref 11.5–15.5)
WBC: 14.9 10*3/uL — ABNORMAL HIGH (ref 4.0–10.5)
nRBC: 0.3 % — ABNORMAL HIGH (ref 0.0–0.2)

## 2018-12-16 LAB — CBC
HCT: 24.6 % — ABNORMAL LOW (ref 36.0–46.0)
Hemoglobin: 8 g/dL — ABNORMAL LOW (ref 12.0–15.0)
MCH: 24.4 pg — ABNORMAL LOW (ref 26.0–34.0)
MCHC: 32.5 g/dL (ref 30.0–36.0)
MCV: 75 fL — ABNORMAL LOW (ref 80.0–100.0)
Platelets: 201 10*3/uL (ref 150–400)
RBC: 3.28 MIL/uL — ABNORMAL LOW (ref 3.87–5.11)
RDW: 18.2 % — ABNORMAL HIGH (ref 11.5–15.5)
WBC: 13.2 10*3/uL — ABNORMAL HIGH (ref 4.0–10.5)
nRBC: 0.6 % — ABNORMAL HIGH (ref 0.0–0.2)

## 2018-12-16 LAB — COMPREHENSIVE METABOLIC PANEL
ALT: 12 U/L (ref 0–44)
AST: 18 U/L (ref 15–41)
Albumin: 2.3 g/dL — ABNORMAL LOW (ref 3.5–5.0)
Alkaline Phosphatase: 115 U/L (ref 38–126)
Anion gap: 8 (ref 5–15)
BUN: 10 mg/dL (ref 6–20)
CO2: 24 mmol/L (ref 22–32)
Calcium: 8 mg/dL — ABNORMAL LOW (ref 8.9–10.3)
Chloride: 105 mmol/L (ref 98–111)
Creatinine, Ser: 0.63 mg/dL (ref 0.44–1.00)
GFR calc Af Amer: >60 mL/min (ref >60)
GFR calc non Af Amer: >60 mL/min (ref >60)
Glucose, Bld: 113 mg/dL — ABNORMAL HIGH (ref 70–99)
Potassium: 3.7 mmol/L (ref 3.5–5.1)
Sodium: 137 mmol/L (ref 135–145)
Total Bilirubin: 0.3 mg/dL (ref 0.3–1.2)
Total Protein: 5.3 g/dL — ABNORMAL LOW (ref 6.5–8.1)

## 2018-12-16 LAB — BASIC METABOLIC PANEL
Anion gap: 7 (ref 5–15)
BUN: 6 mg/dL (ref 6–20)
CO2: 26 mmol/L (ref 22–32)
Calcium: 8.4 mg/dL — ABNORMAL LOW (ref 8.9–10.3)
Chloride: 105 mmol/L (ref 98–111)
Creatinine, Ser: 0.57 mg/dL (ref 0.44–1.00)
GFR calc Af Amer: >60 mL/min (ref >60)
GFR calc non Af Amer: >60 mL/min (ref >60)
Glucose, Bld: 99 mg/dL (ref 70–99)
Potassium: 3.7 mmol/L (ref 3.5–5.1)
Sodium: 138 mmol/L (ref 135–145)

## 2018-12-16 LAB — BPAM RBC
Blood Product Expiration Date: 202012262359
Blood Product Expiration Date: 202012272359
Blood Product Expiration Date: 202012302359
Blood Product Expiration Date: 202012302359
ISSUE DATE / TIME: 202012021731
ISSUE DATE / TIME: 202012021914
ISSUE DATE / TIME: 202012030921
ISSUE DATE / TIME: 202012031248
Unit Type and Rh: 6200
Unit Type and Rh: 6200
Unit Type and Rh: 6200
Unit Type and Rh: 6200

## 2018-12-16 MED ORDER — DIPHENHYDRAMINE HCL 25 MG PO CAPS: 25.0000 mg | ORAL_CAPSULE | Freq: Two times a day (BID) | ORAL | Status: DC

## 2018-12-16 MED ORDER — LABETALOL HCL 5 MG/ML IV SOLN: 20.0000 mg | INTRAVENOUS | Status: DC | PRN

## 2018-12-16 MED ORDER — LABETALOL HCL 5 MG/ML IV SOLN: 40.0000 mg | INTRAVENOUS | Status: DC | PRN

## 2018-12-16 MED ORDER — HYDRALAZINE HCL 20 MG/ML IJ SOLN
10.0000 mg | INTRAMUSCULAR | Status: DC | PRN
  Administered 2018-12-17: 10 mg via INTRAVENOUS
  Filled 2018-12-16: qty 1

## 2018-12-16 MED ORDER — PANTOPRAZOLE SODIUM 40 MG PO TBEC
40.0000 mg | DELAYED_RELEASE_TABLET | Freq: Every day | ORAL | Status: DC
  Administered 2018-12-16 – 2018-12-17 (×2): 40 mg via ORAL
  Filled 2018-12-16: qty 1

## 2018-12-16 MED ORDER — DIPHENHYDRAMINE HCL 25 MG PO CAPS: 25.0000 mg | ORAL_CAPSULE | Freq: Two times a day (BID) | ORAL | Status: DC | PRN

## 2018-12-16 MED ORDER — LABETALOL HCL 5 MG/ML IV SOLN: 80.0000 mg | INTRAVENOUS | Status: DC | PRN

## 2018-12-16 MED ORDER — DIPHENHYDRAMINE HCL 25 MG PO CAPS
25.0000 mg | ORAL_CAPSULE | Freq: Four times a day (QID) | ORAL | Status: DC | PRN
  Administered 2018-12-16 – 2018-12-17 (×4): 25 mg via ORAL
  Filled 2018-12-16 (×4): qty 1

## 2018-12-16 NOTE — Progress Notes (Signed)
Subjective:  Patient is tired from long night of caring for twins and problems with itching. She does not complain of itching at this time. She is tolerating regular diet. Her pain is controlled with PO medications. She is ambulating independently without difficulty. She is voiding without difficulty. PO Atarax is discontinued at this time. New order for PO benadryl.  Objective:  Vital signs in last 24 hours: Temp:  [98 F (36.7 C)-98.9 F (37.2 C)] 98.6 F (37 C) (12/04 0815) Pulse Rate:  [67-89] 72 (12/04 0815) Resp:  [16-20] 18 (12/04 0815) BP: (138-152)/(81-96) 151/84 (12/04 0815) SpO2:  [98 %-100 %] 98 % (12/04 0815)    General: NAD Pulmonary: no increased work of breathing Abdomen: non-distended, non-tender, fundus firm at level of umbilicus Extremities: no edema, no erythema, no tenderness  Results for orders placed or performed during the hospital encounter of 12/13/18 (from the past 72 hour(s))  Comprehensive metabolic panel     Status: Abnormal   Collection Time: 12/13/18  1:33 PM  Result Value Ref Range   Sodium 138 135 - 145 mmol/L   Potassium 2.8 (L) 3.5 - 5.1 mmol/L   Chloride 107 98 - 111 mmol/L   CO2 22 22 - 32 mmol/L   Glucose, Bld 123 (H) 70 - 99 mg/dL   BUN <5 (L) 6 - 20 mg/dL   Creatinine, Ser 5.72 0.44 - 1.00 mg/dL   Calcium 8.2 (L) 8.9 - 10.3 mg/dL   Total Protein 5.7 (L) 6.5 - 8.1 g/dL   Albumin 2.5 (L) 3.5 - 5.0 g/dL   AST 19 15 - 41 U/L   ALT 9 0 - 44 U/L   Alkaline Phosphatase 207 (H) 38 - 126 U/L   Total Bilirubin 0.7 0.3 - 1.2 mg/dL   GFR calc non Af Amer >60 >60 mL/min   GFR calc Af Amer >60 >60 mL/min   Anion gap 9 5 - 15    Comment: Performed at [facility], [address] [place], [place] [address]  CBC on admission     Status: Abnormal   Collection Time: 12/13/18  1:33 PM  Result Value Ref Range   WBC 5.5 4.0 - 10.5 K/uL   RBC 3.99 3.87 - 5.11 MIL/uL   Hemoglobin 9.3 (L) 12.0 - 15.0 g/dL   HCT 59.7 (L) 41.6 - 38.4 %   MCV 72.7 (L) 80.0 - 100.0 fL   MCH 23.3 (L) 26.0 - 34.0 pg   MCHC 32.1 30.0 - 36.0 g/dL   RDW 53.6 (H) 46.8 - 03.2 %   Platelets 190 150 - 400 K/uL   nRBC 0.0 0.0 - 0.2 %    Comment: Performed at [facility], [address] [place], [place] [address]  RPR     Status: None   Collection Time: 12/13/18  1:33 PM  Result Value Ref Range   RPR Ser Ql NON REACTIVE NON REACTIVE    Comment: Performed at [facility], [address], [place], [place] [address]  Type and screen [facility]     Status: None   Collection Time: 12/13/18  1:33 PM  Result Value Ref Range   ABO/RH(D) A POS    Antibody Screen NEG    Sample Expiration 12/16/2018,2359    Unit Number [id number]    Blood Component Type RED CELLS,LR    Unit division 00    Status of Unit REL FROM [facility]    Transfusion Status OK TO TRANSFUSE  Crossmatch Result Compatible    Unit Number [id number]    Blood Component Type RBC LR PHER1    Unit division 00    Status of Unit REL FROM [facility]    Transfusion Status OK TO TRANSFUSE    Crossmatch Result Compatible    Unit Number [id number]    Blood Component Type RED CELLS,LR    Unit division 00    Status of Unit ISSUED,FINAL    Transfusion Status OK TO TRANSFUSE    Crossmatch Result Compatible    Unit Number [id number]    Blood Component Type RED CELLS,LR    Unit division 00    Status of Unit ISSUED,FINAL    Transfusion Status OK TO TRANSFUSE    Crossmatch Result      Compatible Performed at [facility], [address] [place], [place] [address]   Protein / creatinine ratio, urine     Status: Abnormal   Collection Time: 12/13/18  1:59 PM  Result Value Ref Range   Creatinine, Urine 34 mg/dL   Total Protein, Urine 7 mg/dL    Comment: NO NORMAL RANGE ESTABLISHED FOR THIS TEST   Protein Creatinine Ratio 0.21 (H) 0.00 - 0.15 mg/mg[Cre]    Comment: Performed at [facility], [address] [place], [place]  [address]  Prepare RBC     Status: None   Collection Time: 12/13/18  4:52 PM  Result Value Ref Range   Order Confirmation      ORDER PROCESSED BY BLOOD BANK Performed at [facility], [address] [place], [place] [address]   Glucose, capillary     Status: None   Collection Time: 12/13/18 10:31 PM  Result Value Ref Range   Glucose-Capillary 72 70 - 99 mg/dL  Glucose, capillary     Status: Abnormal   Collection Time: 12/14/18  3:22 AM  Result Value Ref Range   Glucose-Capillary 62 (L) 70 - 99 mg/dL  Surgical pathology     Status: None   Collection Time: 12/14/18  5:58 AM  Result Value Ref Range   SURGICAL PATHOLOGY      SURGICAL PATHOLOGY CASE: [id number] PATIENT: Fara Boros Surgical Pathology Report     Specimen Submitted: A. Placenta, twin  Clinical History: Monochorionic diamniotic twin gestation at 67w 3d. Fetal growth restriction of B, avulsion of cord B which was noted to have a marginal possible partial velamentous cord insertion.  Cord B with clamp and avulsion, A no clamp intact.     DIAGNOSIS: A. THIRD TRIMESTER TWIN PLACENTA; VAGINAL DELIVERY: - MONOCHORIONIC/DIAMNIOTIC TWIN PLACENTA WEIGHING 621 GRAMS, BETWEEN 10TH AND 25TH PERCENTILE FOR GESTATIONAL AGE. - PLACENTA A: THREE VESSEL UMBILICAL CORD WITHOUT PATHOLOGIC CHANGES. - PLACENTA B: THREE VESSEL UMBILICAL CORD WITH VELAMENTOUS INSERTION. - NO CHORIOAMNIONITIS, VILLITIS, OR CHANGES SUGGESTIVE OF VASCULAR MALPERFUSION.  GROSS DESCRIPTION: Labeled: Twin placenta. Received: In formalin Specimen: fused twin placenta Integrity: Intact Trimmed Placental Weight: 621 g Disk Measurement: 21 x 20 x 3.5 cm   Dividing Membrane:      Location: In the middle      Appearance: pink smooth and shiny  Placenta A Umbilical Cord: Insertion: 5.5 cm from margin Number of Vessels: Three Attached Cord Length: 32 cm   Diameter: 1.0 cm Additional Cord Fragments:   Not present Twists: There are 1  twist in 5 cm (hypocoiling) Other Cord Findings: 17 cm from maternal surface there is a vascular malformation measuring 2.5 x 1.5 x 1.0 cm without vascular depression. Cord Color: Creamy  white  Membranes Insertion: Marginal Membrane Color: Pink, smooth, shiny and semitransparent Other Membrane Findings: None  Placental Disk Fetal Surface Findings: Pink, smooth, shiny with well-developed engorged vascular structure Maternal Findings: Intact and complete with normal numbers and architectural of the cotyledons. Parenchyma: Red and spongy with multifocal peripheral calcified deposits.  There is no other visible or palpable abnormality identified  Placenta B Umbilical Cord Insertion: Ve lamentous insertion Number of Vessels: Three Attached Cord Length: Separately received (amputated at the membrane insertion level).  Measuring 25 cm in length and   1.0 cm in diameter Additional Cord Fragments: Not present Twists: There are 0 twists in 5 cm (hypocoiling) Other Cord Findings: Containing 1 plastic clip Cord Color: Creamy white  Membranes Insertion: Marginal Membrane Color: Pink, smooth, shiny and semitransparent. Other Membrane Findings: Not identified  Placental Disk Fetal Surface Findings: pink smooth and shiny with engorged well-developed vascular structure Maternal Findings: Intact and complete with normal numbers and architectural of the cotyledons. Parenchyma: Red and spongy.  There is no visible or palpable abnormality identified.  Representative sections submitted in seven cassettes.  Summary of Sections: A1 - Membranes and umbilical cord placenta A A2 - Placenta A with fetal surface A3 - Placenta A with maternal surface A4- Dividing m embrane A5- Membranes and umbilical cord placenta B A6- Placenta B with fetal surface A7- Placenta B with maternal surface   Final Diagnosis performed by Ronald LoboMary Olney, MD.   Electronically signed 12/15/2018 2:28:06PM The electronic  signature indicates that the named Attending Pathologist has evaluated the specimen Technical component performed at [facility], [address], [place], [place] [address] Lab: [phone] Dir: Jolene SchimkeSanjai Nagendra, MD, MMM  Professional component performed at [facility], [facility], [address], [place], [place] [address] Lab: [phone] Dir: Georgiann Cockerara C. Rubinas, MD   CBC     Status: Abnormal   Collection Time: 12/14/18  6:56 AM  Result Value Ref Range   WBC 11.5 (H) 4.0 - 10.5 K/uL   RBC 3.66 (L) 3.87 - 5.11 MIL/uL   Hemoglobin 8.5 (L) 12.0 - 15.0 g/dL    Comment: Reticulocyte Hemoglobin testing may be clinically indicated, consider ordering this additional test [id number]    HCT 26.6 (L) 36.0 - 46.0 %   MCV 72.7 (L) 80.0 - 100.0 fL   MCH 23.2 (L) 26.0 - 34.0 pg   MCHC 32.0 30.0 - 36.0 g/dL   RDW 95.217.2 (H) 84.111.5 - 32.415.5 %   Platelets 186 150 - 400 K/uL   nRBC 0.0 0.0 - 0.2 %    Comment: Performed at [facility], [address] [place], [place] [address]  CBC     Status: Abnormal   Collection Time: 12/14/18  4:51 PM  Result Value Ref Range   WBC 8.9 4.0 - 10.5 K/uL   RBC 3.14 (L) 3.87 - 5.11 MIL/uL   Hemoglobin 7.3 (L) 12.0 - 15.0 g/dL    Comment: Reticulocyte Hemoglobin testing may be clinically indicated, consider ordering this additional test [id number]    HCT 21.8 (L) 36.0 - 46.0 %   MCV 69.4 (L) 80.0 - 100.0 fL   MCH 23.2 (L) 26.0 - 34.0 pg   MCHC 33.5 30.0 - 36.0 g/dL   RDW 03.417.2 (H) 74.211.5 - 59.515.5 %   Platelets 162 150 - 400 K/uL   nRBC 0.0 0.0 - 0.2 %    Comment: Performed at [facility], [address] [place], [place] [address]  CBC     Status: Abnormal  Collection Time: 12/15/18  6:18 AM  Result Value Ref Range   WBC 9.2 4.0 - 10.5 K/uL   RBC 2.63 (L) 3.87 - 5.11 MIL/uL   Hemoglobin 6.1 (L) 12.0 - 15.0 g/dL    Comment: Reticulocyte Hemoglobin testing may be clinically indicated, consider ordering this additional test [id number]     HCT 19.0 (L) 36.0 - 46.0 %   MCV 72.2 (L) 80.0 - 100.0 fL   MCH 23.2 (L) 26.0 - 34.0 pg   MCHC 32.1 30.0 - 36.0 g/dL   RDW 44.0 (H) 34.7 - 42.5 %   Platelets 151 150 - 400 K/uL   nRBC 0.0 0.0 - 0.2 %    Comment: Performed at [facility], [address] [place], [place] [address]  Basic metabolic panel     Status: Abnormal   Collection Time: 12/15/18  6:18 AM  Result Value Ref Range   Sodium 137 135 - 145 mmol/L   Potassium 2.8 (L) 3.5 - 5.1 mmol/L   Chloride 107 98 - 111 mmol/L   CO2 23 22 - 32 mmol/L   Glucose, Bld 84 70 - 99 mg/dL   BUN 6 6 - 20 mg/dL   Creatinine, Ser 7.56 0.44 - 1.00 mg/dL   Calcium 8.0 (L) 8.9 - 10.3 mg/dL   GFR calc non Af Amer >60 >60 mL/min   GFR calc Af Amer >60 >60 mL/min   Anion gap 7 5 - 15    Comment: Performed at [facility], [address] [place], [place] [address]  Prepare RBC     Status: None   Collection Time: 12/15/18  7:54 AM  Result Value Ref Range   Order Confirmation      ORDER PROCESSED BY BLOOD BANK Performed at [facility], [address] [place], [place] [address]   Basic metabolic panel     Status: Abnormal   Collection Time: 12/16/18  6:15 AM  Result Value Ref Range   Sodium 138 135 - 145 mmol/L   Potassium 3.7 3.5 - 5.1 mmol/L   Chloride 105 98 - 111 mmol/L   CO2 26 22 - 32 mmol/L   Glucose, Bld 99 70 - 99 mg/dL   BUN 6 6 - 20 mg/dL   Creatinine, Ser 1.66 0.44 - 1.00 mg/dL   Calcium 8.4 (L) 8.9 - 10.3 mg/dL   GFR calc non Af Amer >60 >60 mL/min   GFR calc Af Amer >60 >60 mL/min   Anion gap 7 5 - 15    Comment: Performed at [facility], [address], [place], [place] [address]   Awaiting results of AM CBC  Assessment:   28 y.o. G3P1011 postpartum day # 2  Plan:    1) Acute blood loss anemia - hemodynamically stable and asymptomatic - po ferrous sulfate, s/p 2 units PRBC yesterday, CBC pending  2) Itching: PO benadryl ordered/atarax discontinued, continue steroid  taper  3) Blood Type --/--/A POS (12/01 1333) / Rubella 3.27 (06/26 1530) / Varicella Immune  4) TDAP status up to date  5) Feeding plan formula  6)  Education given regarding options for contraception, as well as compatibility with breast feeding if applicable.  Patient plans on Nexplanon for contraception.  7) Disposition: discharge pending CBC, control of itching   [name], CNM [facility] 12/16/2018, 9:59 AM

## 2018-12-16 NOTE — Progress Notes (Signed)
At 2030 pt's BP 166/93. Rechecked at 2045 and it was 163/91. At this time another room was calling out and the baby was choking. Went in to assist this baby.   Paged Dr. Glennon Mac.  Rechecked BP again at 2158 once I finished with the other room. It was 143/78.  Dr. Glennon Mac put in orders. Educated pt on importance of letting the nurse know about a headache, dizziness when standing, vision changes, or anything out of the ordinary.    Will continue to monitor. Vital sign check every 4 hours. Will notify the doctor of a confirmatory reading of BP >160/110 15 minutes later.

## 2018-12-16 NOTE — Progress Notes (Signed)
Pt's generalized itching decreased with prednisone and benadryl today.  TED hose applied this afternoon per order to help decrease edema. Pt encouraged to get up out of bed more often and walk around in room.  Hgb 8.7 currently s/p 2 units pRBC yesterday.  BP has been trending up today (151/84, 144/85, 156/93) No headache, blurred vision, epigastric pain noted. Will continue to monitor.

## 2018-12-16 NOTE — Progress Notes (Signed)
Pt not complaining of "itching" earlier in the shift (before 2200).  Creams were applied and prednisone dose was given at 2200.  At midnight pt began complaining of itching all over her body. Encouraged pt to take a shower and apply cool rags.   At 0100 pt called out and said the itching was unbearable. Dr. [place]on Kingfisher paged. He ordered benadryl 25mg  BID PRN.   Nurse attempted to put order in and realized it contraindicated the hydroxyzine 25mg  Q6hours PRN order that was already ordered. Kept the hydroxyzine order.  Gave pt the hydrooxyzine 25mg  tablet at 0147.   Pt slept until 0600 when lab came in to draw blood.   Pt does have +2 pitting edema bilaterally in lower extremities. Pt states her "eyes" are swollen (nurse could not tell this). Pt has tense/tight edema bilaterally in her upper thighs that is non-pitting.   Pt states "I feel like I have the chicken pox or something".  VSS. Will continue to monitor.

## 2018-12-16 NOTE — Progress Notes (Signed)
Dr. Glennon Mac notified of BP trending up today. MD acknowledged and will review chart.

## 2018-12-17 LAB — PROTEIN / CREATININE RATIO, URINE
Creatinine, Urine: 40 mg/dL
Protein Creatinine Ratio: 0.45 mg/mg{Cre} — ABNORMAL HIGH (ref 0.00–0.15)
Total Protein, Urine: 18 mg/dL

## 2018-12-17 MED ORDER — HYDRALAZINE HCL 20 MG/ML IJ SOLN: 10.0000 mg | INTRAMUSCULAR | Status: DC | PRN

## 2018-12-17 MED ORDER — HYDRALAZINE HCL 20 MG/ML IJ SOLN: 5.0000 mg | INTRAMUSCULAR | Status: DC | PRN

## 2018-12-17 MED ORDER — LACTATED RINGERS IV SOLN
INTRAVENOUS | Status: DC
  Administered 2018-12-17: 09:00:00 via INTRAVENOUS

## 2018-12-17 MED ORDER — ZOLPIDEM TARTRATE 5 MG PO TABS
5.0000 mg | ORAL_TABLET | Freq: Every day | ORAL | Status: DC
  Administered 2018-12-17: 5 mg via ORAL
  Filled 2018-12-17: qty 1

## 2018-12-17 MED ORDER — LABETALOL HCL 5 MG/ML IV SOLN: 40.0000 mg | INTRAVENOUS | Status: DC | PRN

## 2018-12-17 MED ORDER — AMLODIPINE BESYLATE 5 MG PO TABS
5.0000 mg | ORAL_TABLET | Freq: Every day | ORAL | Status: DC
  Administered 2018-12-17: 5 mg via ORAL
  Filled 2018-12-17: qty 1

## 2018-12-17 MED ORDER — MAGNESIUM SULFATE 40 GM/1000ML IV SOLN
2.0000 g/h | INTRAVENOUS | Status: DC
  Administered 2018-12-17 – 2018-12-18 (×2): 2 g/h via INTRAVENOUS
  Filled 2018-12-17 (×2): qty 1000

## 2018-12-17 MED ORDER — MAGNESIUM SULFATE BOLUS VIA INFUSION
4.0000 g | Freq: Once | INTRAVENOUS | Status: AC
  Administered 2018-12-17: 4 g via INTRAVENOUS
  Filled 2018-12-17: qty 1000

## 2018-12-17 MED ORDER — LABETALOL HCL 5 MG/ML IV SOLN: 20.0000 mg | INTRAVENOUS | Status: DC | PRN

## 2018-12-17 MED ORDER — CALCIUM GLUCONATE 10 % IV SOLN
INTRAVENOUS | Status: AC
  Filled 2018-12-17: qty 10

## 2018-12-17 NOTE — Progress Notes (Signed)
Admit Date: 12/13/2018 Today's Date: 12/17/2018  Post Partum Day 3  Subjective:  no complaints, up ad lib, voiding, tolerating PO and + flatus  Objective: Temp:  [97.5 F (36.4 C)-98.9 F (37.2 C)] 98.4 F (36.9 C) (12/05 0747) Pulse Rate:  [52-85] 85 (12/05 0747) Resp:  [17-20] 18 (12/05 0747) BP: (143-174)/(78-94) 155/92 (12/05 0747) SpO2:  [98 %-100 %] 99 % (12/05 0747)  Physical Exam:  General: alert, cooperative, fatigued and no distress Lochia: appropriate Uterine Fundus: firm Incision: none DVT Evaluation: bilateral pedal edema. Swelling of bilateral calf and legs is improved  Recent Labs    12/16/18 0615 12/16/18 2246  HGB 8.7* 8.0*  HCT 25.8* 24.6*    Assessment/Plan: 1) Acute blood loss anemia - hemodynamically stable and asymptomatic - po ferrous sulfate, s/p 2 units PRBC yesterday, CBC improved. Will recheck later today  2) Itching: PO benadryl ordered/atarax discontinued, continue steroid taper  3) Blood Type --/--/A POS (12/01 1333) / Rubella 3.27 (06/26 1530) / Varicella Immune  4) TDAP status up to date  5) Feeding plan formula  6)   Patient plans on Nexplanon for contraception.  7) Severe range BP overnight, will transfer to L&D for IV magnessium. Will start Norvasc for BP control. IV hydralazine PRN.     LOS: 4 days   [place] 12/17/2018, 8:46 AM

## 2018-12-17 NOTE — Progress Notes (Signed)
Paged Dr. Glennon Mac. Informed him of blood pressures and heart rate. Pt has no other symptoms.  Explained my concern of giving the beta blocker and dropping her heart rate even lower.  Ordered to give hydralazine 10mg  and recheck BP and heart rate.   Hydralazine 10mg  given via IV at 0548.   Will recheck in 15 minutes.

## 2018-12-17 NOTE — Progress Notes (Signed)
Recheck after hydralazine 10mg  given was 158/90 and HR 52 (at 0606).   Will continue to monitor.

## 2018-12-18 MED ORDER — AMLODIPINE BESYLATE 10 MG PO TABS
10.0000 mg | ORAL_TABLET | Freq: Every day | ORAL | Status: DC
  Administered 2018-12-18: 10 mg via ORAL
  Filled 2018-12-18: qty 1

## 2018-12-18 MED ORDER — ACETAMINOPHEN 500 MG PO TABS: 1000.0000 mg | ORAL_TABLET | Freq: Four times a day (QID) | ORAL | 2 refills | Status: DC | PRN

## 2018-12-18 MED ORDER — BLOOD PRESSURE KIT: 1.0000 | PACK | Freq: Once | 0 refills | Status: AC

## 2018-12-18 MED ORDER — AMLODIPINE BESYLATE 10 MG PO TABS: 10.0000 mg | ORAL_TABLET | Freq: Every day | ORAL | 3 refills | Status: DC

## 2018-12-18 MED ORDER — PREDNISONE 10 MG PO TABS: ORAL_TABLET | ORAL | 0 refills | Status: DC

## 2018-12-18 MED ORDER — DOCUSATE SODIUM 100 MG PO CAPS: 100.0000 mg | ORAL_CAPSULE | Freq: Two times a day (BID) | ORAL | 2 refills | Status: DC

## 2018-12-18 MED ORDER — DIPHENHYDRAMINE HCL 25 MG PO CAPS: 25.0000 mg | ORAL_CAPSULE | Freq: Four times a day (QID) | ORAL | 0 refills | Status: DC | PRN

## 2018-12-18 NOTE — Discharge Instructions (Signed)
Check your blood pressure twice a day. Check it after you have been seated for 5 minutes. Write down the value and keep a log. Come to the hospital if your blood pressure is above 122 systolic (top number) or 482 diastolic (bottom number).     Please call your doctor or return to the ER if you experience any chest pains, shortness of breath, dizziness, visual changes, severe headache (unrelieved by pain meds), fever greater than 101, any heavy bleeding (saturating more than 1 pad per hour), large clots, or foul smelling discharge, any worsening abdominal pain and cramping that is not controlled by pain medication, any calf/leg pain or redness, any breast concerns (redness/pain), or any signs of postpartum depression. No tampons, enemas, douches, or sexual intercourse for 6 weeks. Also avoid tub baths, hot tubs, or swimming for 6 weeks.

## 2018-12-18 NOTE — Progress Notes (Signed)
Pt discharged with infants. Discharge instructions, prescriptions, and follow up appointments given to and reviewed with patient. Pt verbalized understanding. Escorted out by staff. 

## 2018-12-18 NOTE — Discharge Summary (Signed)
OB Discharge Summary     Patient Name: Katelyn Mendoza DOB: December 01, 1990 MRN: [id number]  Date of admission: 12/13/2018 Delivering MD: Homero Fellers, MD  Date of Delivery: 12/13/2018  Date of discharge: 12/18/2018  Admitting diagnosis: 37 wks preg swelling Intrauterine pregnancy: [redacted]w[redacted]d    Secondary diagnosis: Preeclampsia     Discharge diagnosis: Term Pregnancy Delivered, Post partum hemorrhage and Preeclampsia (severe)                         Hospital course:  Induction of Labor With Vaginal Delivery   28y.o. yo G3P1011 at 353w3das admitted to the hospital 12/13/2018 for induction of labor.  Indication for induction: Gestational hypertension and monochorionic-diamniotic twin pregnancy.  Patient had an uncomplicated labor course as follows: Membrane Rupture Time/Date:    MoOzetta, Flatley0[[id number]]11:58 PM    MoDayannara, Pascal0[[id number]]11:58 PM  ,   MoSaysha, Menta0[[id number]]12/13/2018    MoJayel, Scaduto0[[id number]]12/13/2018    Intrapartum Procedures: Episiotomy:    MoJaydon, Soroka0[[id number]]None [1]    MoJohara, Lodwick0[[id number]]None [1]                                          Lacerations:     MoTyianna, Menefee0[[id number]]None [1]    MoWilliam, Laske0[[id number]]None [1]   Patient had delivery of a Viable infant.  Information for the patient's newborn:  MoKessie, Croston0[[id number]]Delivery Method: Vag-Spont  Information for the patient's newborn:  MoJarelyn, Bambach0[[id number]]Delivery Method: VaClaudett, Bayly0[[id number]]12/14/2018    MoTaliya, Mcclard0[[id number]]12/14/2018   Details of delivery can be found in separate delivery note.  Patient had a routine postpartum course. Patient is discharged home 12/18/18.                                                                 Post partum procedures:blood transfusion  Complications: None  Physical exam on 12/18/2018: Vitals:   12/18/18  0849 12/18/18 1055 12/18/18 1138 12/18/18 1544  BP: (!) 151/91 (!) 146/89 (!) 146/91 (!) 143/93  Pulse: 93  92 91  Resp:   18 18  Temp:   98.5 F (36.9 C) 98.5 F (36.9 C)  TempSrc:   Oral Oral  SpO2:   99% 100%  Weight:      Height:       General: alert, cooperative and no distress Lochia: appropriate Uterine Fundus: firm Incision: N/A DVT Evaluation: No evidence of DVT seen on physical exam.  Labs: Lab Results  Component Value Date   WBC 13.2 (H) 12/16/2018   HGB 8.0 (L) 12/16/2018   HCT 24.6 (L) 12/16/2018   MCV 75.0 (L) 12/16/2018   PLT 201 12/16/2018   CMP Latest Ref Rng & Units 12/16/2018  Glucose 70 - 99 mg/dL 113(H)  BUN 6 - 20 mg/dL 10  Creatinine 0.44 - 1.00 mg/dL 0.63  Sodium 135 - 145 mmol/L 137  Potassium  3.5 - 5.1 mmol/L 3.7  Chloride 98 - 111 mmol/L 105  CO2 22 - 32 mmol/L 24  Calcium 8.9 - 10.3 mg/dL 8.0(L)  Total Protein 6.5 - 8.1 g/dL 5.3(L)  Total Bilirubin 0.3 - 1.2 mg/dL 0.3  Alkaline Phos 38 - 126 U/L 115  AST 15 - 41 U/L 18  ALT 0 - 44 U/L 12    Discharge instruction: per After Visit Summary.  Medications:  Allergies as of 12/18/2018   No Known Allergies     Medication List    STOP taking these medications   Accu-Chek Nano SmartView w/Device Kit   Accu-Chek SmartView test strip Generic drug: glucose blood   nystatin-triamcinolone cream Commonly known as: MYCOLOG II   terconazole 0.8 % vaginal cream Commonly known as: TERAZOL 3   valACYclovir 500 MG tablet Commonly known as: Valtrex     TAKE these medications   Accu-Chek FastClix Lancets Misc 1 Units by Percutaneous route 4 (four) times daily.   acetaminophen 500 MG tablet Commonly known as: TYLENOL Take 2 tablets (1,000 mg total) by mouth every 6 (six) hours as needed.   amLODipine 10 MG tablet Commonly known as: NORVASC Take 1 tablet (10 mg total) by mouth daily. Start taking on: December 19, 2018   aspirin 81 MG chewable tablet Chew 1 tablet (81 mg total) by mouth  daily.   diphenhydrAMINE 25 mg capsule Commonly known as: BENADRYL Take 1 capsule (25 mg total) by mouth every 6 (six) hours as needed for itching or allergies.   docusate sodium 100 MG capsule Commonly known as: Colace Take 1 capsule (100 mg total) by mouth 2 (two) times daily.   ferrous sulfate 325 (65 FE) MG tablet Commonly known as: FerrouSul Take 1 tablet (325 mg total) by mouth 2 (two) times daily with a meal.   omeprazole 20 MG capsule Commonly known as: PRILOSEC Take 1 capsule (20 mg total) by mouth daily.   predniSONE 10 MG tablet Commonly known as: DELTASONE Take 1 tab 3 times a day for 2 days Take 1 tab 2 times a day for 2 days Take 1 tab a day for 2 days   prenatal multivitamin Tabs tablet Take 1 tablet by mouth daily at 12 noon.            Discharge Care Instructions  (From admission, onward)         Start     Ordered   12/18/18 0000  Discharge wound care:    Comments: SHOWER DAILY Wash incision gently with soap and water.  Call office with any drainage, redness, or firmness of the incision.   12/18/18 1743          Diet: routine diet  Activity: Advance as tolerated. Pelvic rest for 6 weeks.   Outpatient follow up:     Postpartum contraception: Nexplanon Rhogam Given postpartum: no Rubella vaccine given postpartum: no Varicella vaccine given postpartum: no TDaP given antepartum or postpartum: Yes  Newborn Data:   Karmin, Kasprzak [[id number]]  Live born female  Birth Weight: 5 lb 6.1 oz (2440 g) APGAR: 8, 9  Newborn Delivery   Birth date/time: 12/14/2018 04:53:00 Delivery type: Vaginal, Spontaneous       Keisi, Eckford [[id number]]  Live born female  Birth Weight: 4 lb 13.3 oz (2190 g) APGAR: 6, 8  Newborn Delivery   Birth date/time: 12/14/2018 05:08:00 Delivery type: Vaginal, Forceps       Baby Feeding: Bottle  Disposition:home with mother  SIGNED:  Homero Fellers, MD 12/18/2018 5:45 PM

## 2018-12-18 NOTE — Final Progress Note (Signed)
Admit Date: 12/13/2018 Today's Date: 12/18/2018  Post Partum Day 4  Subjective:  no complaints, up ad lib, voiding, tolerating PO and + flatus  Objective: Temp:  [98.3 F (36.8 C)-98.6 F (37 C)] 98.5 F (36.9 C) (12/06 1544) Pulse Rate:  [89-102] 91 (12/06 1544) Resp:  [16-18] 18 (12/06 1544) BP: (133-173)/(78-104) 143/93 (12/06 1544) SpO2:  [97 %-100 %] 100 % (12/06 1544)  Physical Exam:  General: alert and cooperative Lochia: appropriate Uterine Fundus: firm Incision: none DVT Evaluation: No evidence of DVT seen on physical exam.  Recent Labs    12/16/18 0615 12/16/18 2246  HGB 8.7* 8.0*  HCT 25.8* 24.6*    Assessment/Plan: 1) Acute blood loss anemia - hemodynamically stable and asymptomatic - po ferrous sulfate, s/p 2 units PRBC, CBC improved.  2) Itching: PO benadryl ordered, continue steroid taper  3) Blood Type--/--/A POS (12/01 1333)/ Rubella 3.27 (06/26 1530)/ Varicella Immune  4) TDAP statusup to date  5) Feeding planformula  6) Patient plans on Nexplanonfor contraception.  7) Postpartum preeclampsia- s/p magnesium. Blood pressures moderate range. Patient has been eager to be discharged home. Not sleeping well here in the hospital and would like to see her other child. Will check BP at home and follow up this week in office for a BP check. Will continue norvasc at home. Discussed warning signs of preeclampsia with Ciera.    LOS: 5 days   [place] 12/18/2018, 5:46 PM

## 2018-12-21 ENCOUNTER — Encounter: Payer: Self-pay | Admitting: Obstetrics and Gynecology

## 2018-12-21 ENCOUNTER — Other Ambulatory Visit: Payer: Self-pay

## 2018-12-21 ENCOUNTER — Ambulatory Visit (INDEPENDENT_AMBULATORY_CARE_PROVIDER_SITE_OTHER): Payer: Medicaid Other | Admitting: Obstetrics and Gynecology

## 2018-12-21 DIAGNOSIS — R030 Elevated blood-pressure reading, without diagnosis of hypertension: Secondary | ICD-10-CM

## 2018-12-21 DIAGNOSIS — O1495 Unspecified pre-eclampsia, complicating the puerperium: Secondary | ICD-10-CM

## 2018-12-21 NOTE — Progress Notes (Signed)
Patient ID: Katelyn Mendoza, female   DOB: 1990-12-15, 28 y.o.   MRN: [id number]  Reason for Consult: Postpartum Care   Referred by No ref. provider found  Subjective:     HPI:  Katelyn Mendoza is a 28 y.o. female. She is fatigued. Was up all night with twins.  Has some back pain from delivery. Denis headache, vision changes or RUQ pain. Has been taking BP medication as directed.   Past Medical History:  Diagnosis Date  . Gallstones   . HSV (herpes simplex virus) infection   . Mental disorder    bipolar disorder dx'd at age 50  . Sickle cell trait ([place])   . Spinal headache   . Urinary tract infection affecting care of mother, antepartum    Family History  Problem Relation Age of Onset  . Diabetes Mother   . Hypertension Mother   . Asthma Mother   . Asthma Brother   . Colon cancer Maternal Grandfather    Past Surgical History:  Procedure Laterality Date  . NO PAST SURGERIES      Short Social History:  Social History   Tobacco Use  . Smoking status: Never Smoker  . Smokeless tobacco: Never Used  Substance Use Topics  . Alcohol use: No    No Known Allergies  Current Outpatient Medications  Medication Sig Dispense Refill  . Accu-Chek FastClix Lancets MISC 1 Units by Percutaneous route 4 (four) times daily. 100 each 12  . acetaminophen (TYLENOL) 500 MG tablet Take 2 tablets (1,000 mg total) by mouth every 6 (six) hours as needed. 100 tablet 2  . amLODipine (NORVASC) 10 MG tablet Take 1 tablet (10 mg total) by mouth daily. 30 tablet 3  . omeprazole (PRILOSEC) 20 MG capsule Take 1 capsule (20 mg total) by mouth daily. 30 capsule 3  . predniSONE (DELTASONE) 10 MG tablet Take 1 tab 3 times a day for 2 days Take 1 tab 2 times a day for 2 days Take 1 tab a day for 2 days 12 tablet 0  . Prenatal Vit-Fe Fumarate-FA (PRENATAL MULTIVITAMIN) TABS tablet Take 1 tablet by mouth daily at 12 noon.    . docusate sodium (COLACE) 100 MG capsule Take 1 capsule (100 mg total) by mouth  2 (two) times daily. (Patient not taking: Reported on 12/21/2018) 60 capsule 2  . ferrous sulfate (FERROUSUL) 325 (65 FE) MG tablet Take 1 tablet (325 mg total) by mouth 2 (two) times daily with a meal. (Patient not taking: Reported on 12/13/2018) 60 tablet 11   No current facility-administered medications for this visit.     Review of Systems  Constitutional: Negative for chills, fatigue, fever and unexpected weight change.  HENT: Negative for trouble swallowing.  Eyes: Negative for loss of vision.  Respiratory: Negative for cough, shortness of breath and wheezing.  Cardiovascular: Negative for chest pain, leg swelling, palpitations and syncope.  GI: Negative for abdominal pain, blood in stool, diarrhea, nausea and vomiting.  GU: Negative for difficulty urinating, dysuria, frequency and hematuria.  Musculoskeletal: Negative for back pain, leg pain and joint pain.  Skin: Negative for rash.  Neurological: Negative for dizziness, headaches, light-headedness, numbness and seizures.  Psychiatric: Negative for behavioral problem, confusion, depressed mood and sleep disturbance.      Objective:  Objective   Vitals:   12/21/18 1023  BP: 130/80  Pulse: 98  Temp: (!) 97.2 F (36.2 C)  Weight: 168 lb (76.2 kg)  Height: 5\' 1"  (1.549 m)  Body mass index is 31.74 kg/m.  Physical Exam Vitals signs and nursing note reviewed.  Constitutional:      Appearance: She is well-developed.  HENT:     Head: Normocephalic and atraumatic.  Eyes:     Pupils: Pupils are equal, round, and reactive to light.  Cardiovascular:     Rate and Rhythm: Normal rate and regular rhythm.  Pulmonary:     Effort: Pulmonary effort is normal. No respiratory distress.  Skin:    General: Skin is warm and dry.  Neurological:     Mental Status: She is alert and oriented to person, place, and time.  Psychiatric:        Behavior: Behavior normal.        Thought Content: Thought content normal.        Judgment:  Judgment normal.         Assessment/Plan:    28 yo s/p SVD of twin pregnancy complicated by postpartum preeclampsia.  Continue Norvasc 10 mg daily.  Discussed warning signs of preeclampsia.  Patient was not able to obtain BP cuff.  Follow up in 1 week for BP check.     Adelene Idler MD [facility], [facility] 12/21/2018 10:55 AM

## 2018-12-29 ENCOUNTER — Telehealth: Payer: Self-pay

## 2018-12-29 NOTE — Telephone Encounter (Signed)
Does she need to go to ER for amount of bleed or is she okay to monitor herself for now

## 2018-12-29 NOTE — Telephone Encounter (Signed)
Call and discuss with patient. If passing large clots the size of her hand or saturating a pad more than 1 an hour for longer than 2 hours or feeling dizzy best to go to the ER.

## 2018-12-29 NOTE — Telephone Encounter (Signed)
Pt calling triage, she had her twins on 12/2, She feels like the bleeding is getting heaver not lighter, I advised her that some days could be light and others may be heavy , I also advised her she will bleed for a while after giving birth, she states she is changing her pad every hour, so I advised her she could go to the er if she thought it was too much. So she states its not over flowing just heavy. Should she be worried, is this normal? Please advise,

## 2019-01-04 ENCOUNTER — Other Ambulatory Visit: Payer: Self-pay

## 2019-01-04 ENCOUNTER — Telehealth: Payer: Self-pay

## 2019-01-04 ENCOUNTER — Ambulatory Visit: Payer: Medicaid Other

## 2019-01-04 NOTE — Telephone Encounter (Signed)
Patient is schedule 01/04/19

## 2019-01-04 NOTE — Telephone Encounter (Signed)
Pt calling; wants appt for BP check, 6w pp, bc.  [phone]

## 2019-01-09 ENCOUNTER — Telehealth: Payer: Self-pay

## 2019-01-09 NOTE — Telephone Encounter (Signed)
Pt is wanting to let AMS know that she would like to be on the Depo shot for her birth control, she would like to get that done as soon as possible. Thank you

## 2019-01-10 ENCOUNTER — Other Ambulatory Visit: Payer: Self-pay | Admitting: Obstetrics and Gynecology

## 2019-01-10 MED ORDER — MEDROXYPROGESTERONE ACETATE 150 MG/ML IM SUSP: 150.0000 mg | INTRAMUSCULAR | 3 refills | Status: DC

## 2019-01-10 NOTE — Telephone Encounter (Signed)
Needs 6 week postpartum week of 1/13 to bring depo to that visit for administration, Rx has been sent

## 2019-01-10 NOTE — Telephone Encounter (Signed)
Patient is calling to follow up if prescription has been sent to pharmacy. Please advise

## 2019-01-11 NOTE — Telephone Encounter (Signed)
Patient is schedule for 01/23/19

## 2019-01-12 ENCOUNTER — Telehealth: Payer: Self-pay

## 2019-01-12 NOTE — Telephone Encounter (Signed)
Pt calling for depo shot appt and a blood pressure check appt.  [phone]

## 2019-01-12 NOTE — Telephone Encounter (Signed)
Pt aware; wants depo before appt on the 11th.  C/o some off and on back pain - states she may be trying to do too much.  Also c/o chest tightness she is concerned about.  Adv to go to ED.

## 2019-01-12 NOTE — Telephone Encounter (Signed)
Patient is schedule 01/16/19 for depo

## 2019-01-16 ENCOUNTER — Ambulatory Visit (INDEPENDENT_AMBULATORY_CARE_PROVIDER_SITE_OTHER): Payer: Medicaid Other

## 2019-01-16 ENCOUNTER — Other Ambulatory Visit: Payer: Self-pay

## 2019-01-16 DIAGNOSIS — Z3042 Encounter for surveillance of injectable contraceptive: Secondary | ICD-10-CM | POA: Diagnosis not present

## 2019-01-16 MED ORDER — MEDROXYPROGESTERONE ACETATE 150 MG/ML IM SUSP
150.0000 mg | Freq: Once | INTRAMUSCULAR | Status: AC
  Administered 2019-01-16: 150 mg via INTRAMUSCULAR

## 2019-01-16 MED ORDER — MEDROXYPROGESTERONE ACETATE 150 MG/ML IM SUSP: 150.0000 mg | Freq: Once | INTRAMUSCULAR | Status: DC

## 2019-01-23 ENCOUNTER — Ambulatory Visit (INDEPENDENT_AMBULATORY_CARE_PROVIDER_SITE_OTHER): Payer: Medicaid Other | Admitting: Obstetrics and Gynecology

## 2019-01-23 ENCOUNTER — Encounter: Payer: Self-pay | Admitting: Obstetrics and Gynecology

## 2019-01-23 ENCOUNTER — Other Ambulatory Visit: Payer: Self-pay

## 2019-01-23 DIAGNOSIS — N760 Acute vaginitis: Secondary | ICD-10-CM

## 2019-01-23 DIAGNOSIS — Z1389 Encounter for screening for other disorder: Secondary | ICD-10-CM | POA: Diagnosis not present

## 2019-01-23 DIAGNOSIS — O99345 Other mental disorders complicating the puerperium: Secondary | ICD-10-CM

## 2019-01-23 DIAGNOSIS — F53 Postpartum depression: Secondary | ICD-10-CM

## 2019-01-23 DIAGNOSIS — Z113 Encounter for screening for infections with a predominantly sexual mode of transmission: Secondary | ICD-10-CM

## 2019-01-23 MED ORDER — SERTRALINE HCL 50 MG PO TABS: 50.0000 mg | ORAL_TABLET | Freq: Every day | ORAL | 2 refills | Status: DC

## 2019-01-23 NOTE — Progress Notes (Signed)
Postpartum Visit  Chief Complaint:  Chief Complaint  Patient presents with  . Postpartum Care    Vaginal deivery 12/14/18    History of Present Illness: Patient is a 29 y.o. [id number] presents for postpartum visit.  Date of delivery: 12/14/2018 Type of delivery: Vaginal delivery - Vacuum or forceps assisted  yes Episiotomy No.  Laceration: no  Pregnancy or labor problems: Mo/Di twin pregnancy, preeclampsia Any problems since the delivery:  no  Newborn Details:  SINGLETON :  1. BabyGender female. Birth weight: 5lbs 6.1oz and 4lbs 13.3oz Maternal Details:  Breast or formula feeding: plans to bottle feed Contraception after delivery: Yes Depo Any bowel or bladder issues: No  Post partum depression/anxiety noted:  yes Edinburgh Post-Partum Depression Score11 Date of last PAP: 07/22/2018 no abnormalities   Review of Systems: Review of Systems  Constitutional: Negative for chills and fever.  HENT: Negative for congestion.   Respiratory: Negative for cough and shortness of breath.   Cardiovascular: Negative for chest pain and palpitations.  Gastrointestinal: Negative for abdominal pain, constipation, diarrhea, heartburn, nausea and vomiting.  Genitourinary: Negative for dysuria, frequency and urgency.  Skin: Negative for itching and rash.  Neurological: Negative for dizziness and headaches.  Endo/Heme/Allergies: Negative for polydipsia.  Psychiatric/Behavioral: Positive for depression. Negative for hallucinations, memory loss, substance abuse and suicidal ideas. The patient is nervous/anxious and has insomnia.     The following portions of the patient's history were reviewed and updated as appropriate: allergies, current medications, past family history, past medical history, past social history, past surgical history and problem list.  Past Medical History:  Past Medical History:  Diagnosis Date  . Gallstones   . HSV (herpes simplex virus) infection   . Mental disorder    bipolar disorder dx'd at age 5  . Sickle cell trait ([facility])   . Spinal headache   . Urinary tract infection affecting care of mother, antepartum     Past Surgical History:  Past Surgical History:  Procedure Laterality Date  . NO PAST SURGERIES      Family History:  Family History  Problem Relation Age of Onset  . Diabetes Mother   . Hypertension Mother   . Asthma Mother   . Asthma Brother   . Colon cancer Maternal Grandfather     Social History:  Social History   Socioeconomic History  . Marital status: Single    Spouse name: Not on file  . Number of children: Not on file  . Years of education: Not on file  . Highest education level: Not on file  Occupational History  . Not on file  Tobacco Use  . Smoking status: Never Smoker  . Smokeless tobacco: Never Used  Substance and Sexual Activity  . Alcohol use: No  . Drug use: No  . Sexual activity: Not Currently    Birth control/protection: None  Other Topics Concern  . Not on file  Social History Narrative  . Not on file   Social Determinants of Health   Financial Resource Strain:   . Difficulty of Paying Living Expenses: Not on file  Food Insecurity: Unknown  . Worried About Programme researcher, broadcasting/film/video in the Last Year: Patient refused  . Ran Out of Food in the Last Year: Patient refused  Transportation Needs: No Transportation Needs  . Lack of Transportation (Medical): No  . Lack of Transportation (Non-Medical): No  Physical Activity: Unknown  . Days of Exercise per Week: Patient refused  . Minutes of Exercise  per Session: Patient refused  Stress: No Stress Concern Present  . Feeling of Stress : Not at all  Social Connections: Unknown  . Frequency of Communication with Friends and Family: Patient refused  . Frequency of Social Gatherings with Friends and Family: Patient refused  . Attends Religious Services: Patient refused  . Active Member of Clubs or Organizations: Patient refused  . Attends Tax inspector Meetings: Patient refused  . Marital Status: Patient refused  Intimate Partner Violence: Not At Risk  . Fear of Current or Ex-Partner: No  . Emotionally Abused: No  . Physically Abused: No  . Sexually Abused: No    Allergies:  No Known Allergies  Medications: Prior to Admission medications   Medication Sig Start Date End Date Taking? Authorizing Provider  Accu-Chek FastClix Lancets MISC 1 Units by Percutaneous route 4 (four) times daily. 10/17/18   Nadara Mustard, MD  acetaminophen (TYLENOL) 500 MG tablet Take 2 tablets (1,000 mg total) by mouth every 6 (six) hours as needed. 12/18/18 12/18/19  Schuman, Jaquelyn Bitter, MD  amLODipine (NORVASC) 10 MG tablet Take 1 tablet (10 mg total) by mouth daily. 12/19/18   Schuman, Jaquelyn Bitter, MD  docusate sodium (COLACE) 100 MG capsule Take 1 capsule (100 mg total) by mouth 2 (two) times daily. Patient not taking: Reported on 12/21/2018 12/18/18 12/18/19  Natale Milch, MD  ferrous sulfate (FERROUSUL) 325 (65 FE) MG tablet Take 1 tablet (325 mg total) by mouth 2 (two) times daily with a meal. Patient not taking: Reported on 12/13/2018 07/22/18   Natale Milch, MD  medroxyPROGESTERone (DEPO-PROVERA) 150 MG/ML injection Inject 1 mL (150 mg total) into the muscle every 3 (three) months. 01/10/19 04/10/19  Vena Austria, MD  omeprazole (PRILOSEC) 20 MG capsule Take 1 capsule (20 mg total) by mouth daily. 11/09/18   Oswaldo Conroy, CNM  predniSONE (DELTASONE) 10 MG tablet Take 1 tab 3 times a day for 2 days Take 1 tab 2 times a day for 2 days Take 1 tab a day for 2 days 12/18/18   Natale Milch, MD  Prenatal Vit-Fe Fumarate-FA (PRENATAL MULTIVITAMIN) TABS tablet Take 1 tablet by mouth daily at 12 noon.    [provider]    Physical Exam not currently breastfeeding.    General: NAD HEENT: normocephalic, anicteric Pulmonary: No increased work of breathing Abdomen: NABS, soft, non-tender, non-distended.   Umbilicus without lesions.  No hepatomegaly, splenomegaly or masses palpable. No evidence of hernia. Genitourinary:  External: Normal external female genitalia.  Normal urethral meatus, normal  Bartholin's and Skene's glands.    Vagina: Normal vaginal mucosa, no evidence of prolapse.    Cervix: Grossly normal in appearance, no bleeding  Uterus: Non-enlarged, mobile, normal contour.  No CMT  Adnexa: ovaries non-enlarged, no adnexal masses  Rectal: deferred Extremities: no edema, erythema, or tenderness Neurologic: Grossly intact Psychiatric: mood appropriate, affect full  Edinburgh Postnatal Depression Scale - 01/23/19 1148      Edinburgh Postnatal Depression Scale:  In the Past 7 Days   I have been able to laugh and see the funny side of things.  0    I have looked forward with enjoyment to things.  1    I have blamed myself unnecessarily when things went wrong.  2    I have been anxious or worried for no good reason.  2    I have felt scared or panicky for no good reason.  2    Things have  been getting on top of me.  2    I have been so unhappy that I have had difficulty sleeping.  1    I have felt sad or miserable.  1    I have been so unhappy that I have been crying.  1    The thought of harming myself has occurred to me.  0    Edinburgh Postnatal Depression Scale Total  12       Assessment: 29 y.o. [id number] presenting for 6 week postpartum visit  Plan: Problem List Items Addressed This Visit    None    Visit Diagnoses    6 weeks postpartum follow-up    -  Primary   Acute vaginitis       Relevant Orders   NuSwab Vaginitis Plus (VG+)   Routine screening for STI (sexually transmitted infection)       Relevant Orders   NuSwab Vaginitis Plus (VG+)   HEP, RPR, HIV Panel   Postpartum depression       Relevant Medications   sertraline (ZOLOFT) 50 MG tablet       1) Contraception - Education given regarding options for contraception, as well as compatibility with breast  feeding if applicable.  Patient plans on Depo-Provera injections for contraception.  2)  Pap - ASCCP guidelines and rational discussed.  ASCCP guidelines and rational discussed.  Patient opts for every 3 years screening interval  3) Patient underwent screening for postpartum depression  - start Zoloft 50mg   4) Return in about 2 weeks (around 02/06/2019) for medication follow up phone 2-4 weeks.   Malachy Mood, MD, [facility], [facility] 01/23/2019, 12:13 PM

## 2019-01-24 LAB — HEP, RPR, HIV PANEL
HIV Screen 4th Generation wRfx: NONREACTIVE
Hepatitis B Surface Ag: NEGATIVE
RPR Ser Ql: NONREACTIVE

## 2019-01-27 LAB — NUSWAB VAGINITIS PLUS (VG+)
Atopobium vaginae: HIGH Score — AB
BVAB 2: HIGH Score — AB
Candida albicans, NAA: NEGATIVE
Candida glabrata, NAA: NEGATIVE
Chlamydia trachomatis, NAA: NEGATIVE
Megasphaera 1: HIGH Score — AB
Neisseria gonorrhoeae, NAA: NEGATIVE
Trich vag by NAA: NEGATIVE

## 2019-01-30 ENCOUNTER — Telehealth: Payer: Self-pay

## 2019-01-30 ENCOUNTER — Other Ambulatory Visit: Payer: Self-pay | Admitting: Obstetrics and Gynecology

## 2019-01-30 MED ORDER — METRONIDAZOLE 500 MG PO TABS: 500.0000 mg | ORAL_TABLET | Freq: Two times a day (BID) | ORAL | 0 refills | Status: AC

## 2019-01-30 NOTE — Telephone Encounter (Signed)
Pt calling for results from 01/23/19.  [phone]

## 2019-01-30 NOTE — Telephone Encounter (Signed)
advise

## 2019-02-06 ENCOUNTER — Other Ambulatory Visit: Payer: Self-pay | Admitting: Obstetrics & Gynecology

## 2019-02-06 ENCOUNTER — Telehealth: Payer: Self-pay

## 2019-02-06 DIAGNOSIS — N938 Other specified abnormal uterine and vaginal bleeding: Secondary | ICD-10-CM

## 2019-02-06 MED ORDER — DROSPIRENONE-ETHINYL ESTRADIOL 3-0.02 MG PO TABS: 1.0000 | ORAL_TABLET | Freq: Every day | ORAL | 0 refills | Status: DC

## 2019-02-06 NOTE — Telephone Encounter (Signed)
Pt called triage stating she has been on Depo for a few weeks now and it has not stopped her bleeding or even slowed it down. She is bleeding through a pad in about an hour and a half. Pt stated "I cannot take this anymore, I just need some help" I asked pt if she wanted me to send message to AMS who would be back tomorrow or if she wanted me to consult with another MD today. Pt stated she was okay with [facility] giving her some advise on this. Please advise in AMS absence as to what pt should do.

## 2019-02-06 NOTE — Telephone Encounter (Signed)
Often we can overlap with a pack of birth control pills to see if can quiet down the bleeding until it can better regulate itself (a common issue of break thru bleeding when starting Depo).  Can leave sample of call it in to pharmacy, let me know her choice.

## 2019-02-06 NOTE — Telephone Encounter (Signed)
Pt aware.

## 2019-02-06 NOTE — Telephone Encounter (Signed)
Pt would like Rx sent to her pharmacy pls.

## 2019-02-06 NOTE — Telephone Encounter (Signed)
Can you ask pt if she would like a sample of bc or to have it called in to her pharmacy. Thank you

## 2019-03-22 ENCOUNTER — Other Ambulatory Visit: Payer: Self-pay | Admitting: Obstetrics & Gynecology

## 2019-03-22 DIAGNOSIS — N938 Other specified abnormal uterine and vaginal bleeding: Secondary | ICD-10-CM

## 2019-03-23 ENCOUNTER — Telehealth: Payer: Self-pay

## 2019-03-23 NOTE — Telephone Encounter (Signed)
Pt calling for more bc to help with her bleeding.  [phone]  Adv pt refill was sent in yesterday.

## 2019-04-24 ENCOUNTER — Telehealth: Payer: Self-pay

## 2019-04-24 ENCOUNTER — Other Ambulatory Visit: Payer: Self-pay

## 2019-04-24 ENCOUNTER — Ambulatory Visit (INDEPENDENT_AMBULATORY_CARE_PROVIDER_SITE_OTHER): Payer: Medicaid Other

## 2019-04-24 VITALS — BP 106/68 | HR 89 | Ht 61.0 in

## 2019-04-24 DIAGNOSIS — Z0130 Encounter for examination of blood pressure without abnormal findings: Secondary | ICD-10-CM

## 2019-04-24 DIAGNOSIS — R079 Chest pain, unspecified: Secondary | ICD-10-CM

## 2019-04-24 NOTE — Telephone Encounter (Signed)
Patient requesting refill of OCP for continued bleeding. She also is inquiring about having her blood pressure checcked as she is having chest pains and tightening in her chest. Cb#[phone]

## 2019-04-24 NOTE — Telephone Encounter (Signed)
Spoke w/patient. Advised OCP rx sent last month was sent w/10 rf's. Pt to contact pharmacy to initiate refills. Advised if having chest pains should be seen at PCP, but even PCP may refer to ER as chest pains could be cardiac related or GI related. Inquired if patient has ever had Acid Reflux. She states she had it during pregnancy. She has not tried any meds to see if will relieve the chest pains. She states she was on BP meds during pregnancy but isn't on them currently. Advised to contact PCP, if unable to get in, can be seen here for nurse visit to check BP (3 MD's here for consultation if needed today).

## 2019-04-24 NOTE — Progress Notes (Signed)
Patient reports pains in right upper chest (below collar bone) w/tightness (muscle) x1 month. She denies shortness of breath or heart palpitations. She has not tried anything to relieve the pain. BP within normal limits. Patient reports holding infant of this right side most of the time. Consulted w/Dr. Tiburcio Pea and patient was advised again to follow up with PCP if pain worsens/continues. Also advised can try heat/ice to the area and Ibuprofen or aleve that should help if musculoskeletal.

## 2019-04-27 ENCOUNTER — Telehealth: Payer: Self-pay

## 2019-04-27 NOTE — Telephone Encounter (Signed)
Spoke w/patient. Advised irregular bleeding is common during the first 3 months of starting/changing birth control. ShPt reports she is bleeding daily. It was light/spotting but has returned to red blood. It isn't heavy, just aggravating. Patient reports she was on depo previously and didn't have the bleeding issue and she's not used to it and just ready for the bleeding to stop as she's been bleeding for 3 months straight. Advised will send to Dr. Jerene Pitch (per pt request) for review.

## 2019-04-27 NOTE — Telephone Encounter (Signed)
Patient reports she is no longer on Depo. She is on OCP's now. She is continuously bleeding. She is inquiring if there is anything that can be prescribed to help with the bleeding. Cb#[phone]

## 2019-04-28 NOTE — Telephone Encounter (Signed)
Patient would like to come in to be tested for infection.

## 2019-04-28 NOTE — Telephone Encounter (Signed)
Patient is schedule for 05/02/19 with ABC

## 2019-05-02 ENCOUNTER — Other Ambulatory Visit (HOSPITAL_COMMUNITY)
Admission: RE | Admit: 2019-05-02 | Discharge: 2019-05-02 | Disposition: A | Discharge status: Home / Self Care | Payer: Medicaid Other | Source: Ambulatory Visit | Attending: Obstetrics and Gynecology | Admitting: Obstetrics and Gynecology

## 2019-05-02 ENCOUNTER — Ambulatory Visit (INDEPENDENT_AMBULATORY_CARE_PROVIDER_SITE_OTHER): Payer: Medicaid Other | Admitting: Obstetrics and Gynecology

## 2019-05-02 ENCOUNTER — Encounter: Payer: Self-pay | Admitting: Obstetrics and Gynecology

## 2019-05-02 ENCOUNTER — Other Ambulatory Visit: Payer: Self-pay

## 2019-05-02 VITALS — BP 100/70 | Ht 61.0 in | Wt 158.0 lb

## 2019-05-02 DIAGNOSIS — N760 Acute vaginitis: Secondary | ICD-10-CM

## 2019-05-02 DIAGNOSIS — N898 Other specified noninflammatory disorders of vagina: Secondary | ICD-10-CM | POA: Diagnosis present

## 2019-05-02 DIAGNOSIS — B9689 Other specified bacterial agents as the cause of diseases classified elsewhere: Secondary | ICD-10-CM

## 2019-05-02 DIAGNOSIS — Z113 Encounter for screening for infections with a predominantly sexual mode of transmission: Secondary | ICD-10-CM | POA: Diagnosis present

## 2019-05-02 NOTE — Patient Instructions (Signed)
I value your feedback and entrusting us with your care. If you get a [facility] patient survey, I would appreciate you taking the time to let us know about your experience today. Thank you!  As of December 22, 2018, your lab results will be released to your MyChart immediately, before I even have a chance to see them. Please give me time to review them and contact you if there are any abnormalities. Thank you for your patience.  

## 2019-05-02 NOTE — Progress Notes (Signed)
System, Pcp Not In   Chief Complaint  Patient presents with  . STD testing    HPI:      [name] is a 29 y.o. [phone] who LMP was No LMP recorded. (Menstrual status: Oral contraceptives)., presents today for STD testing. No known exposures or sx. No new sex partners. Using condoms sometimes. Would also like BV to be checked. Having mild itch after change in soap recently. No increased d/c, odor today. Hx of BV 1/21 on culture with neg STD swab and lab testing 1/21. Treated with flagyl with sx relief.  Pt on OCPs with BTB. Was on depo with BTB so started on yaz. Decided just to do OCPs and stop depo this month.    Past Medical History:  Diagnosis Date  . Gallstones   . HSV (herpes simplex virus) infection   . Mental disorder    bipolar disorder dx'd at age 43  . Sickle cell trait ([place])   . Spinal headache   . Urinary tract infection affecting care of mother, antepartum     Past Surgical History:  Procedure Laterality Date  . NO PAST SURGERIES      Family History  Problem Relation Age of Onset  . Diabetes Mother   . Hypertension Mother   . Asthma Mother   . Asthma Brother   . Colon cancer Maternal Grandfather     Social History   Socioeconomic History  . Marital status: Single    Spouse name: Not on file  . Number of children: Not on file  . Years of education: Not on file  . Highest education level: Not on file  Occupational History  . Not on file  Tobacco Use  . Smoking status: Never Smoker  . Smokeless tobacco: Never Used  Substance and Sexual Activity  . Alcohol use: No  . Drug use: No  . Sexual activity: Yes    Birth control/protection: Pill  Other Topics Concern  . Not on file  Social History Narrative  . Not on file   Social Determinants of Health   Financial Resource Strain:   . Difficulty of Paying Living Expenses:   Food Insecurity: Unknown  . Worried About Charity fundraiser in the Last Year: Patient refused  . Ran Out of  Food in the Last Year: Patient refused  Transportation Needs: No Transportation Needs  . Lack of Transportation (Medical): No  . Lack of Transportation (Non-Medical): No  Physical Activity: Unknown  . Days of Exercise per Week: Patient refused  . Minutes of Exercise per Session: Patient refused  Stress: No Stress Concern Present  . Feeling of Stress : Not at all  Social Connections: Unknown  . Frequency of Communication with Friends and Family: Patient refused  . Frequency of Social Gatherings with Friends and Family: Patient refused  . Attends Religious Services: Patient refused  . Active Member of Clubs or Organizations: Patient refused  . Attends Archivist Meetings: Patient refused  . Marital Status: Patient refused  Intimate Partner Violence: Not At Risk  . Fear of Current or Ex-Partner: No  . Emotionally Abused: No  . Physically Abused: No  . Sexually Abused: No    Outpatient Medications Prior to Visit  Medication Sig Dispense Refill  . drospirenone-ethinyl estradiol (YAZ) 3-0.02 MG tablet TAKE 1 TABLET BY MOUTH DAILY 28 tablet 10  . valACYclovir (VALTREX) 500 MG tablet Take 500 mg by mouth 2 (two) times daily.    Marland Kitchen  medroxyPROGESTERone (DEPO-PROVERA) 150 MG/ML injection Inject 1 mL (150 mg total) into the muscle every 3 (three) months. 1 mL 3  . sertraline (ZOLOFT) 50 MG tablet Take 1 tablet (50 mg total) by mouth daily. 30 tablet 2   No facility-administered medications prior to visit.      ROS:  Review of Systems  Constitutional: Negative for fever.  Gastrointestinal: Negative for blood in stool, constipation, diarrhea, nausea and vomiting.  Genitourinary: Negative for dyspareunia, dysuria, flank pain, frequency, hematuria, urgency, vaginal bleeding, vaginal discharge and vaginal pain.  Musculoskeletal: Negative for back pain.  Skin: Negative for rash.    OBJECTIVE:   Vitals:  BP 100/70   Ht 5\' 1"  (1.549 m)   Wt 158 lb (71.7 kg)   Breastfeeding No    BMI 29.85 kg/m   Physical Exam Vitals reviewed.  Constitutional:      Appearance: She is well-developed.  Pulmonary:     Effort: Pulmonary effort is normal.  Genitourinary:    General: Normal vulva.     Pubic Area: No rash.      Labia:        Right: No rash, tenderness or lesion.        Left: No rash, tenderness or lesion.      Vagina: Normal. No vaginal discharge, erythema or tenderness.     Cervix: Normal.     Uterus: Normal. Not enlarged and not tender.      Adnexa: Right adnexa normal and left adnexa normal.       Right: No mass or tenderness.         Left: No mass or tenderness.    Musculoskeletal:        General: Normal range of motion.     Cervical back: Normal range of motion.  Skin:    General: Skin is warm and dry.  Neurological:     General: No focal deficit present.     Mental Status: She is alert and oriented to person, place, and time.  Psychiatric:        Mood and Affect: Mood normal.        Behavior: Behavior normal.        Thought Content: Thought content normal.        Judgment: Judgment normal.     Assessment/Plan: Vaginal discharge - Plan: Cervicovaginal ancillary only; Check labs. No sx of BV today. Will f/u if pos.  Screening for STD (sexually transmitted disease) - Plan: Cervicovaginal ancillary only     Return if symptoms worsen or fail to improve.  Hadassa Cermak B. Leonce Bale, PA-C 05/02/2019 4:55 PM

## 2019-05-04 LAB — CERVICOVAGINAL ANCILLARY ONLY
Bacterial Vaginitis (gardnerella): POSITIVE — AB
Chlamydia: NEGATIVE
Comment: NEGATIVE
Comment: NEGATIVE
Comment: NEGATIVE
Comment: NORMAL
Neisseria Gonorrhea: NEGATIVE
Trichomonas: NEGATIVE

## 2019-05-04 MED ORDER — METRONIDAZOLE 500 MG PO TABS: 500.0000 mg | ORAL_TABLET | Freq: Two times a day (BID) | ORAL | 0 refills | Status: AC

## 2019-05-04 NOTE — Addendum Note (Signed)
Addended by: Althea Grimmer B on: 05/04/2019 05:32 PM   Modules accepted: Orders

## 2019-05-13 HISTORY — PX: CHOLECYSTECTOMY: SHX55

## 2019-05-20 ENCOUNTER — Emergency Department
Admission: EM | Admit: 2019-05-20 | Discharge: 2019-05-20 | Disposition: A | Discharge status: Home / Self Care | Payer: Medicaid Other | Attending: Emergency Medicine | Admitting: Emergency Medicine

## 2019-05-20 ENCOUNTER — Encounter: Payer: Self-pay | Admitting: Emergency Medicine

## 2019-05-20 ENCOUNTER — Other Ambulatory Visit: Payer: Self-pay

## 2019-05-20 ENCOUNTER — Emergency Department: Payer: Medicaid Other

## 2019-05-20 DIAGNOSIS — R1010 Upper abdominal pain, unspecified: Secondary | ICD-10-CM

## 2019-05-20 DIAGNOSIS — D573 Sickle-cell trait: Secondary | ICD-10-CM | POA: Diagnosis not present

## 2019-05-20 DIAGNOSIS — Z79899 Other long term (current) drug therapy: Secondary | ICD-10-CM | POA: Insufficient documentation

## 2019-05-20 DIAGNOSIS — R1013 Epigastric pain: Secondary | ICD-10-CM | POA: Diagnosis not present

## 2019-05-20 LAB — LIPASE, BLOOD: Lipase: 23 U/L (ref 11–51)

## 2019-05-20 LAB — CBC
HCT: 43.1 % (ref 36.0–46.0)
Hemoglobin: 14.9 g/dL (ref 12.0–15.0)
MCH: 26.2 pg (ref 26.0–34.0)
MCHC: 34.6 g/dL (ref 30.0–36.0)
MCV: 75.9 fL — ABNORMAL LOW (ref 80.0–100.0)
Platelets: 434 10*3/uL — ABNORMAL HIGH (ref 150–400)
RBC: 5.68 MIL/uL — ABNORMAL HIGH (ref 3.87–5.11)
RDW: 13.2 % (ref 11.5–15.5)
WBC: 5.9 10*3/uL (ref 4.0–10.5)
nRBC: 0 % (ref 0.0–0.2)

## 2019-05-20 LAB — URINALYSIS, COMPLETE (UACMP) WITH MICROSCOPIC
Bacteria, UA: NONE SEEN
Bilirubin Urine: NEGATIVE
Glucose, UA: NEGATIVE mg/dL
Ketones, ur: NEGATIVE mg/dL
Leukocytes,Ua: NEGATIVE
Nitrite: NEGATIVE
Protein, ur: NEGATIVE mg/dL
Specific Gravity, Urine: 1.018 (ref 1.005–1.030)
pH: 5 (ref 5.0–8.0)

## 2019-05-20 LAB — COMPREHENSIVE METABOLIC PANEL
ALT: 203 U/L — ABNORMAL HIGH (ref 0–44)
AST: 97 U/L — ABNORMAL HIGH (ref 15–41)
Albumin: 4.3 g/dL (ref 3.5–5.0)
Alkaline Phosphatase: 92 U/L (ref 38–126)
Anion gap: 12 (ref 5–15)
BUN: 9 mg/dL (ref 6–20)
CO2: 21 mmol/L — ABNORMAL LOW (ref 22–32)
Calcium: 9.5 mg/dL (ref 8.9–10.3)
Chloride: 105 mmol/L (ref 98–111)
Creatinine, Ser: 0.73 mg/dL (ref 0.44–1.00)
GFR calc Af Amer: >60 mL/min (ref >60)
GFR calc non Af Amer: >60 mL/min (ref >60)
Glucose, Bld: 94 mg/dL (ref 70–99)
Potassium: 3.3 mmol/L — ABNORMAL LOW (ref 3.5–5.1)
Sodium: 138 mmol/L (ref 135–145)
Total Bilirubin: 1.5 mg/dL — ABNORMAL HIGH (ref 0.3–1.2)
Total Protein: 8.3 g/dL — ABNORMAL HIGH (ref 6.5–8.1)

## 2019-05-20 LAB — POCT PREGNANCY, URINE: Preg Test, Ur: NEGATIVE

## 2019-05-20 LAB — TROPONIN I (HIGH SENSITIVITY): Troponin I (High Sensitivity): <2 ng/L (ref <18)

## 2019-05-20 MED ORDER — SODIUM CHLORIDE 0.9 % IV BOLUS
1000.0000 mL | Freq: Once | INTRAVENOUS | Status: AC
  Administered 2019-05-20: 1000 mL via INTRAVENOUS

## 2019-05-20 MED ORDER — HYDROCODONE-ACETAMINOPHEN 5-325 MG PO TABS: 1.0000 | ORAL_TABLET | ORAL | 0 refills | Status: DC | PRN

## 2019-05-20 MED ORDER — SODIUM CHLORIDE 0.9% FLUSH: 3.0000 mL | Freq: Once | INTRAVENOUS | Status: DC

## 2019-05-20 MED ORDER — ONDANSETRON HCL 4 MG PO TABS: 4.0000 mg | ORAL_TABLET | Freq: Every day | ORAL | 0 refills | Status: DC | PRN

## 2019-05-20 MED ORDER — ONDANSETRON HCL 4 MG/2ML IJ SOLN
4.0000 mg | Freq: Once | INTRAMUSCULAR | Status: AC
  Administered 2019-05-20: 21:00:00 4 mg via INTRAVENOUS
  Filled 2019-05-20: qty 2

## 2019-05-20 MED ORDER — KETOROLAC TROMETHAMINE 30 MG/ML IJ SOLN
15.0000 mg | Freq: Once | INTRAMUSCULAR | Status: AC
  Administered 2019-05-20: 15 mg via INTRAVENOUS
  Filled 2019-05-20: qty 1

## 2019-05-20 NOTE — ED Notes (Signed)
Ultrasound at bedside

## 2019-05-20 NOTE — ED Provider Notes (Signed)
[facility] Emergency Department Provider Note  Time seen: 8:53 PM  I have reviewed the triage vital signs and the nursing notes.   HISTORY  Chief Complaint Abdominal Pain   HPI Katelyn Mendoza is a 29 y.o. female with a past medical history of gallstones, bipolar, presents to the emergency department for upper abdominal pain nausea vomiting diarrhea.  According to the patient for the past 4 days or so she has been experiencing upper abdominal pain along with nausea vomiting and diarrhea.  Denies any fever.  Denies any shortness of breath but does state mild chest tightness at times.  Patient states she has had problems with her gallbladder in the past but has not had it removed.  States the pain is moderate but intermittent.  She is not sure if it worsens with food.   Past Medical History:  Diagnosis Date  . Gallstones   . HSV (herpes simplex virus) infection   . Mental disorder    bipolar disorder dx'd at age 74  . Sickle cell trait ([place])   . Spinal headache   . Urinary tract infection affecting care of mother, antepartum     Patient Active Problem List   Diagnosis Date Noted  . Elevated blood pressure affecting pregnancy in third trimester, antepartum 12/13/2018  . Gestational hypertension 12/13/2018  . Positive GBS test 12/07/2018  . Pregnancy affected by fetal growth restriction 11/07/2018  . Threatened preterm labor 11/02/2018  . Indication for care in labor and delivery, antepartum 09/20/2018  . Pregnancy 09/20/2018  . Chlamydia trachomatis infection in mother during second trimester of pregnancy 09/06/2018  . Family history of Down syndrome   . Sickle cell trait in mother affecting pregnancy ([place])   . BMI 30.0-30.9,adult 07/08/2018  . Bipolar I disorder ([place]) 07/04/2018  . Gallstones 07/04/2018  . Vulvovaginitis due to yeast 07/04/2018  . Twin pregnancy, monochorionic/diamniotic, unspecified trimester 05/27/2018  . Obesity affecting pregnancy  05/27/2018  . Supervision of high-risk pregnancy 05/10/2018  . 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 Nausea and vomiting during pregnancy 05/10/2018    Past Surgical History:  Procedure Laterality Date  . NO PAST SURGERIES      Prior to Admission medications   Medication Sig Start Date End Date Taking? Authorizing Provider  drospirenone-ethinyl estradiol (YAZ) 3-0.02 MG tablet TAKE 1 TABLET BY MOUTH DAILY 03/22/19   Gae Dry, MD  valACYclovir (VALTREX) 500 MG tablet Take 500 mg by mouth 2 (two) times daily.    [provider]    No Known Allergies  Family History  Problem Relation Age of Onset  . Diabetes Mother   . Hypertension Mother   . Asthma Mother   . Asthma Brother   . Colon cancer Maternal Grandfather     Social History Social History   Tobacco Use  . Smoking status: Never Smoker  . Smokeless tobacco: Never Used  Substance Use Topics  . Alcohol use: No  . Drug use: No    Review of Systems Constitutional: Negative for fever Cardiovascular: Negative for chest pain. Respiratory: Negative for shortness of breath. Gastrointestinal: Upper abdominal pain.  Positive for nausea vomiting diarrhea Genitourinary: Negative for urinary compaints Musculoskeletal: Negative for musculoskeletal complaints Neurological: Negative for headache All other ROS negative  ____________________________________________   PHYSICAL EXAM:  VITAL SIGNS: ED Triage Vitals [05/20/19 1806]  Enc Vitals Group     BP 119/80     Pulse Rate (!) 108     Resp 20  Temp (!) 97.5 F (36.4 C)     Temp Source Oral     SpO2 99 %     Weight 158 lb (71.7 kg)     Height 5\' 1"  (1.549 m)     Head Circumference      Peak Flow      Pain Score 6     Pain Loc      Pain Edu?      Excl. in GC?     Constitutional: Alert and oriented. Well appearing and in no distress. Eyes: Normal exam ENT      Head: Normocephalic and atraumatic.      Mouth/Throat: Mucous membranes are  moist. Cardiovascular: Normal rate, regular rhythm.  Respiratory: Normal respiratory effort without tachypnea nor retractions. Breath sounds are clear Gastrointestinal: Soft and nontender. No distention.  Musculoskeletal: Nontender with normal range of motion in all extremities.  Neurologic:  Normal speech and language. No gross focal neurologic deficits  Skin:  Skin is warm, dry and intact.  Psychiatric: Mood and affect are normal.   ____________________________________________    EKG  EKG viewed and interpreted by myself shows a normal sinus rhythm at 100 bpm with a narrow QRS, normal axis, normal intervals besides QTC prolongation.  Nonspecific ST changes.  ____________________________________________    RADIOLOGY  Cholelithiasis without evidence of cholecystitis.  Possible evidence of hepatitis.  ____________________________________________   INITIAL IMPRESSION / ASSESSMENT AND PLAN / ED COURSE  Pertinent labs & imaging results that were available during my care of the patient were reviewed by me and considered in my medical decision making (see chart for details).   Patient presents to the emergency department for upper abdominal discomfort intermittent over the past 4 days along with nausea vomiting diarrhea.  Differential would include gastroenteritis, enteritis, pancreatitis, gallbladder disease.  Patient's labs show elevated LFTs.  We will obtain a right upper quadrant ultrasound to further evaluate.  Patient does have nonspecific findings on her EKG.  Does state intermittent chest tightness we will add a troponin onto the patient's lab work.  Patient agreeable to plan of care.  Patient's ultrasound shows cholelithiasis without evidence of cholecystitis.  There is possible evidence of hepatitis which could also explain the patient's elevated LFTs and GI symptoms.  Differential this time would include hepatitis/viral illness versus biliary colic.  We will send a hepatitis  panel.  We will discharge with a short course of pain and nausea medication and otherwise supportive care at home.  Patient agreeable to plan of care.  Katelyn Mendoza was evaluated in Emergency Department on 05/20/2019 for the symptoms described in the history of present illness. She was evaluated in the context of the global COVID-19 pandemic, which necessitated consideration that the patient might be at risk for infection with the SARS-CoV-2 virus that causes COVID-19. Institutional protocols and algorithms that pertain to the evaluation of patients at risk for COVID-19 are in a state of rapid change based on information released by regulatory bodies including the CDC and federal and state organizations. These policies and algorithms were followed during the patient's care in the ED.  ____________________________________________   FINAL CLINICAL IMPRESSION(S) / ED DIAGNOSES  Epigastric pain   07/20/2019, MD 05/20/19 2235

## 2019-05-20 NOTE — ED Notes (Signed)
First Nurse Note: Pt ambulatory into ED stating that she is having abd pain, N/V/D since last night. Pt is in NAD.

## 2019-05-20 NOTE — Discharge Instructions (Signed)
Please call the number provided for general surgery to arrange a follow-up appointment.  Please take your pain and nausea medication as needed, but only as written.  Please drink plenty of fluids and obtain plenty of rest.  Return to the emergency department for any increased pain, fever, unable to keep down fluids, or any other symptom personally concerning to yourself.

## 2019-05-20 NOTE — ED Triage Notes (Signed)
Pt presents to ED via POV with c/o upper abdominal pain since Tuesday. Pt endorses N/V/D. Pt states 4-5 episodes of vomiting in 24 hrs and approx 6 episodes of diarrhea in 24 hrs. Pt A&O x4, skin warm, dry, and intact. Ambulatory to triage at this time.

## 2019-05-21 LAB — HEPATITIS PANEL, ACUTE
HCV Ab: NONREACTIVE
Hep A IgM: NONREACTIVE
Hep B C IgM: NONREACTIVE
Hepatitis B Surface Ag: NONREACTIVE

## 2019-05-22 ENCOUNTER — Telehealth: Payer: Self-pay

## 2019-05-22 NOTE — Telephone Encounter (Signed)
Per Dr.Rodenberg "She had a GB attack, was in the ED, needs to f/u with [place]. Would you please reach out to her? Thanks." Left detailed message for patient to give our office a call back at her earliest convenience.

## 2019-05-30 ENCOUNTER — Other Ambulatory Visit: Payer: Self-pay

## 2019-05-30 ENCOUNTER — Encounter: Payer: Self-pay | Admitting: Surgery

## 2019-05-30 ENCOUNTER — Ambulatory Visit: Payer: Self-pay | Admitting: Surgery

## 2019-05-30 ENCOUNTER — Ambulatory Visit (INDEPENDENT_AMBULATORY_CARE_PROVIDER_SITE_OTHER): Payer: Medicaid Other | Admitting: Surgery

## 2019-05-30 VITALS — BP 108/70 | HR 87 | Temp 97.5°F | Ht 61.0 in | Wt 156.0 lb

## 2019-05-30 DIAGNOSIS — K8010 Calculus of gallbladder with chronic cholecystitis without obstruction: Secondary | ICD-10-CM

## 2019-05-30 NOTE — H&P (View-Only) (Signed)
Patient ID: Katelyn Mendoza, female   DOB: [birth year], 29 y.o.   MRN: [id number]  Chief Complaint: Epigastric pain, nausea and loose bowel movements.  History of Present Illness Katelyn Mendoza is a 29 y.o. female with the visit today after an ED visit for the above.  Uncertain what food intolerance produces her symptoms, but apparently postprandial exacerbations as the patient has diminished eating.  She reports a significant amount of postprandial nausea and associated vomiting.  Even as recent as today.  Her epigastric pain seems to have some substernal radiation.  Her loose bowel movements are frequent and soft, but not watery with undigested food.  She denies fevers, or chills.  She was diagnosed with gallstones in the ED and at that time had some elevated transaminases and a slightly elevated bilirubin with out an elevated alkaline phosphatase.  Her common bile duct diameter was 3 mm on her ultrasound.  Past Medical History Past Medical History:  Diagnosis Date  . Gallstones   . HSV (herpes simplex virus) infection   . Mental disorder    bipolar disorder dx'd at age 21  . Sickle cell trait ([place])   . Spinal headache   . Urinary tract infection affecting care of mother, antepartum       Past Surgical History:  Procedure Laterality Date  . NO PAST SURGERIES      No Known Allergies  Current Outpatient Medications  Medication Sig Dispense Refill  . drospirenone-ethinyl estradiol (YAZ) 3-0.02 MG tablet TAKE 1 TABLET BY MOUTH DAILY 28 tablet 10  . HYDROcodone-acetaminophen (NORCO/VICODIN) 5-325 MG tablet Take 1 tablet by mouth every 4 (four) hours as needed. 15 tablet 0  . ondansetron (ZOFRAN) 4 MG tablet Take 1 tablet (4 mg total) by mouth daily as needed for nausea or vomiting. 20 tablet 0  . valACYclovir (VALTREX) 500 MG tablet Take 500 mg by mouth 2 (two) times daily.     No current facility-administered medications for this visit.    Family History Family History  Problem  Relation Age of Onset  . Diabetes Mother   . Hypertension Mother   . Asthma Mother   . Asthma Brother   . Colon cancer Maternal Grandfather       Social History Social History   Tobacco Use  . Smoking status: Never Smoker  . Smokeless tobacco: Never Used  Substance Use Topics  . Alcohol use: No  . Drug use: No        Review of Systems  All other systems reviewed and are negative.     Physical Exam Blood pressure 108/70, pulse 87, temperature (!) 97.5 F (36.4 C), temperature source Temporal, height 5\' 1"  (1.549 m), weight 156 lb (70.8 kg), SpO2 98 %, not currently breastfeeding. Last Weight  Most recent update: 05/30/2019  4:08 PM   Weight  70.8 kg (156 lb)            CONSTITUTIONAL: Well developed, and nourished, appropriately responsive and aware without distress.   EYES: Sclera non-icteric.   EARS, NOSE, MOUTH AND THROAT: Mask worn.    Hearing is intact to voice.  NECK: Trachea is midline, and there is no jugular venous distension.  LYMPH NODES:  Lymph nodes in the neck are not enlarged. RESPIRATORY:  Lungs are clear, and breath sounds are equal bilaterally. Normal respiratory effort without pathologic use of accessory muscles. CARDIOVASCULAR: Heart is regular in rate and rhythm. GI: The abdomen is soft, nontender, and nondistended. There were no palpable masses.  I did not appreciate hepatosplenomegaly. There were normal bowel sounds. MUSCULOSKELETAL:  Symmetrical muscle tone appreciated in all four extremities.    SKIN: Skin turgor is normal. No pathologic skin lesions appreciated.  NEUROLOGIC:  Motor and sensation appear grossly normal.  Cranial nerves are grossly without defect. PSYCH:  Alert and oriented to person, place and time. Affect is appropriate for situation.  Data Reviewed I have personally reviewed what is currently available of the patient's imaging, recent labs and medical records.   Labs:  CBC Latest Ref Rng & Units 05/20/2019 12/16/2018 12/16/2018   WBC 4.0 - 10.5 K/uL 5.9 13.2(H) 14.9(H)  Hemoglobin 12.0 - 15.0 g/dL 78.9 8.0(L) 8.7(L)  Hematocrit 36.0 - 46.0 % 43.1 24.6(L) 25.8(L)  Platelets 150 - 400 K/uL 434(H) 201 208   CMP Latest Ref Rng & Units 05/20/2019 12/16/2018 12/16/2018  Glucose 70 - 99 mg/dL 94 381(O) 99  BUN 6 - 20 mg/dL 9 10 6   Creatinine 0.44 - 1.00 mg/dL 1.75 1.02  Sodium 135 - 145 mmol/L 138 137 138  Potassium 3.5 - 5.1 mmol/L 3.3(L) 3.7 3.7  Chloride 98 - 111 mmol/L 105 105 105  CO2 22 - 32 mmol/L 21(L) 24 26  Calcium 8.9 - 10.3 mg/dL 9.5 8.0(L) 8.4(L)  Total Protein 6.5 - 8.1 g/dL 8.3(H) 5.3(L) -  Total Bilirubin 0.3 - 1.2 mg/dL 5.85) 0.3 -  Alkaline Phos 38 - 126 U/L 92 115 -  AST 15 - 41 U/L 97(H) 18 -  ALT 0 - 44 U/L 203(H) 12 -      Imaging: Radiology review: CLINICAL DATA:  Elevated LFTs  EXAM: ULTRASOUND ABDOMEN LIMITED RIGHT UPPER QUADRANT  COMPARISON:  None.  FINDINGS: Gallbladder:  The gallbladder is contracted which limits evaluation. There appears to be at least 1 shadowing gallstone. The gallbladder wall is not thickened measuring approximately 2 mm. The sonographic 2.7(P sign is negative.  Common bile duct:  Diameter: 3 mm  Liver:  There is few echogenic foci scattered throughout the liver parenchyma. The liver parenchyma appears normal with respect echogenicity. There is no discrete hepatic mass. Portal vein is patent on color Doppler imaging with normal direction of blood flow towards the liver.  Other: None.  IMPRESSION: 1. There is cholelithiasis without secondary signs of acute cholecystitis. 2. There are few echogenic foci scattered throughout the liver parenchyma which are nonspecific but can be seen in patients with acute hepatitis. Correlation with laboratory studies is recommended.   Electronically Signed   By: Eulah Pont M.D.   On: 05/20/2019 22:20  Within last 24 hrs: No results found.  Assessment    Chronic calculus  cholecystitis with biliary colic. Patient Active Problem List   Diagnosis Date Noted  . Elevated blood pressure affecting pregnancy in third trimester, antepartum 12/13/2018  . Gestational hypertension 12/13/2018  . Positive GBS test 12/07/2018  . Pregnancy affected by fetal growth restriction 11/07/2018  . Threatened preterm labor 11/02/2018  . Indication for care in labor and delivery, antepartum 09/20/2018  . Pregnancy 09/20/2018  . Chlamydia trachomatis infection in mother during second trimester of pregnancy 09/06/2018  . Family history of Down syndrome   . Sickle cell trait in mother affecting pregnancy ([facility])   . BMI 30.0-30.9,adult 07/08/2018  . Bipolar I disorder ([facility]) 07/04/2018  . Gallstones 07/04/2018  . Vulvovaginitis due to yeast 07/04/2018  . Twin pregnancy, monochorionic/diamniotic, unspecified trimester 05/27/2018  . Obesity affecting pregnancy 05/27/2018  . Supervision of high-risk pregnancy 05/10/2018  . HSV infection  05/10/2018  . Nausea and vomiting during pregnancy 05/10/2018    Plan    Robotic cholecystectomy. The risks, benefits, potential complications, alternative treatment options, evaluations and diagnoses, and likely outcomes were discussed in detail with the patient. The possibility of anesthetic complications, bleeding, infection, finding a normal appearing gallbladder, trauma to adjacent organs, or injury to surrounding structures, bile leak or obstruction,  the need for additional procedures, reaction to medication, pulmonary aspiration, the possible need to convert to an open procedure, and issues requiring transfusion or further operations were discussed with the patient. Questions sought, and answered to satisfaction.  No guarantees were ever spoken or implied.  The patient and/or family concurred with the proposed plan, and gave informed consent.   Face-to-face time spent with the patient and accompanying care providers(if present) was 35 minutes, with  more than 50% of the time spent counseling, educating, and coordinating care of the patient.      Sebastian Lurz M.D., FACS 05/30/2019, 4:52 PM     

## 2019-05-30 NOTE — Patient Instructions (Addendum)
Our surgery scheduler Katelyn Mendoza will contact you within the next 24-48 hours. During that call, she will discuss the preparation prior to surgery and also she will discuss the different dates and times to get scheduled with the provider. Please have the BLUE sheet available when she contacts you. If you have any questions or concerns, please give our office a call.  Cholecystitis  Cholecystitis is inflammation of the gallbladder. It is often called a gallbladder attack. The gallbladder is a pear-shaped organ that lies beneath the liver on the right side of the body. The gallbladder stores bile, which is a fluid that helps the body digest fats. If bile builds up in your gallbladder, your gallbladder becomes inflamed. This condition may occur suddenly. Cholecystitis is a serious condition and requires treatment. What are the causes? The most common cause of this condition is gallstones. Gallstones can block the tube (duct) that carries bile out of your gallbladder. This causes bile to build up. Other causes include:  Damage to the gallbladder due to a decrease in blood flow.  Infections in the bile ducts.  Scars or kinks in the bile ducts.  Tumors in the liver, pancreas, or gallbladder. What increases the risk? You are more likely to develop this condition if:  You have sickle cell disease.  You take birth control pills or use estrogen.  You have alcoholic liver disease.  You have liver cirrhosis.  You have your nutrition delivered through a vein (parenteral nutrition).  You are critically ill.  You do not eat or drink for a long time. This is also called "fasting."  You are obese.  You lose weight too fast.  You are pregnant.  You have high levels of fat (triglycerides) in the blood.  You have pancreatitis. What are the signs or symptoms? Symptoms of this condition include:  Pain in the abdomen, especially in the upper right area of the abdomen.  Tenderness or bloating in  the abdomen.  Nausea.  Vomiting.  Fever.  Chills. How is this diagnosed? This condition is diagnosed with a medical history and physical exam. You may also have other tests, including:  Imaging tests, such as: ? An ultrasound of the gallbladder. ? A CT scan of the abdomen. ? A gallbladder nuclear scan (HIDA scan). This scan allows your health care provider to see the bile moving from your liver to your gallbladder and on to your small intestine. ? MRI.  Blood tests, such as: ? A complete blood count. The white blood cell count may be higher than normal. ? Liver function tests. Certain types of gallstones cause some results to be higher than normal. How is this treated? Treatment may include:  Surgery to remove your gallbladder (cholecystectomy).  Antibiotic medicine, usually through an IV.  Fasting for a certain amount of time.  Giving IV fluids.  Medicine to treat pain or vomiting. Follow these instructions at home:  If you had surgery, follow instructions from your health care provider about home care after the procedure. Medicines   Take over-the-counter and prescription medicines only as told by your health care provider.  If you were prescribed an antibiotic medicine, take it as told by your health care provider. Do not stop taking the antibiotic even if you start to feel better. General instructions  Follow instructions from your health care provider about what to eat or drink. When you are allowed to eat, avoid eating or drinking anything that triggers your symptoms.  Do not lift anything that is  heavier than 10 lb (4.5 kg), or the limit that you are told, until your health care provider says that it is safe.  Do not use any products that contain nicotine or tobacco, such as cigarettes and e-cigarettes. If you need help quitting, ask your health care provider.  Keep all follow-up visits as told by your health care provider. This is important. Contact a health  care provider if:  Your pain is not controlled with medicine.  You have a fever. Get help right away if:  Your pain moves to another part of your abdomen or to your back.  You continue to have symptoms or you develop new symptoms even with treatment. Summary  Cholecystitis is inflammation of the gallbladder.  The most common cause of this condition is gallstones. Gallstones can block the tube (duct) that carries bile out of your gallbladder.  Common symptoms are pain in the abdomen, nausea, vomiting, fever, and chills.  This condition is treated with surgery to remove the gallbladder, medicines, fasting, and IV fluids.  Follow your health care provider's instructions for eating and drinking. Avoid eating anything that triggers your symptoms. This information is not intended to replace advice given to you by your health care provider. Make sure you discuss any questions you have with your health care provider. Document Revised: 05/07/2017 Document Reviewed: 05/07/2017 Elsevier Patient Education  [place] If you have a gallbladder condition, you may have trouble digesting fats. Eating a low-fat diet can help reduce your symptoms, and may be helpful before and after having surgery to remove your gallbladder (cholecystectomy). Your health care provider may recommend that you work with a diet and nutrition specialist (dietitian) to help you reduce the amount of fat in your diet. What are tips for following this plan? General guidelines  Limit your fat intake to less than 30% of your total daily calories. If you eat around 1,800 calories each day, this is less than 60 grams (g) of fat per day.  Fat is an important part of a healthy diet. Eating a low-fat diet can make it hard to maintain a healthy body weight. Ask your dietitian how much fat, calories, and other nutrients you need each day.  Eat small, frequent meals throughout the day instead of  three large meals.  Drink at least 8-10 cups of fluid a day. Drink enough fluid to keep your urine clear or pale yellow.  Limit alcohol intake to no more than 1 drink a day for nonpregnant women and 2 drinks a day for men. One drink equals 12 oz of beer, 5 oz of wine, or 1 oz of hard liquor. Reading food labels  Check Nutrition Facts on food labels for the amount of fat per serving. Choose foods with less than 3 grams of fat per serving. Shopping  Choose nonfat and low-fat healthy foods. Look for the words "nonfat," "low fat," or "fat free."  Avoid buying processed or prepackaged foods. Cooking  Cook using low-fat methods, such as baking, broiling, grilling, or boiling.  Cook with small amounts of healthy fats, such as olive oil, grapeseed oil, canola oil, or sunflower oil. What foods are recommended?   All fresh, frozen, or canned fruits and vegetables.  Whole grains.  Low-fat or non-fat (skim) milk and yogurt.  Lean meat, skinless poultry, fish, eggs, and beans.  Low-fat protein supplement powders or drinks.  Spices and herbs. What foods are not recommended?  High-fat foods. These include baked goods,  fast food, fatty cuts of meat, ice cream, french toast, sweet rolls, pizza, cheese bread, foods covered with butter, creamy sauces, or cheese.  Fried foods. These include french fries, tempura, battered fish, breaded chicken, fried breads, and sweets.  Foods with strong odors.  Foods that cause bloating and gas. Summary  A low-fat diet can be helpful if you have a gallbladder condition, or before and after gallbladder surgery.  Limit your fat intake to less than 30% of your total daily calories. This is about 60 g of fat if you eat 1,800 calories each day.  Eat small, frequent meals throughout the day instead of three large meals. This information is not intended to replace advice given to you by your health care provider. Make sure you discuss any questions you have  with your health care provider. Document Revised: 04/21/2018 Document Reviewed: 02/06/2016 Elsevier Patient Education  2020 [employer].

## 2019-05-30 NOTE — Progress Notes (Signed)
Patient ID: Katelyn Mendoza, female   DOB: [birth year], 29 y.o.   MRN: [id number]  Chief Complaint: Epigastric pain, nausea and loose bowel movements.  History of Present Illness Katelyn Mendoza is a 29 y.o. female with the visit today after an ED visit for the above.  Uncertain what food intolerance produces her symptoms, but apparently postprandial exacerbations as the patient has diminished eating.  She reports a significant amount of postprandial nausea and associated vomiting.  Even as recent as today.  Her epigastric pain seems to have some substernal radiation.  Her loose bowel movements are frequent and soft, but not watery with undigested food.  She denies fevers, or chills.  She was diagnosed with gallstones in the ED and at that time had some elevated transaminases and a slightly elevated bilirubin with out an elevated alkaline phosphatase.  Her common bile duct diameter was 3 mm on her ultrasound.  Past Medical History Past Medical History:  Diagnosis Date  . Gallstones   . HSV (herpes simplex virus) infection   . Mental disorder    bipolar disorder dx'd at age 21  . Sickle cell trait ([place])   . Spinal headache   . Urinary tract infection affecting care of mother, antepartum       Past Surgical History:  Procedure Laterality Date  . NO PAST SURGERIES      No Known Allergies  Current Outpatient Medications  Medication Sig Dispense Refill  . drospirenone-ethinyl estradiol (YAZ) 3-0.02 MG tablet TAKE 1 TABLET BY MOUTH DAILY 28 tablet 10  . HYDROcodone-acetaminophen (NORCO/VICODIN) 5-325 MG tablet Take 1 tablet by mouth every 4 (four) hours as needed. 15 tablet 0  . ondansetron (ZOFRAN) 4 MG tablet Take 1 tablet (4 mg total) by mouth daily as needed for nausea or vomiting. 20 tablet 0  . valACYclovir (VALTREX) 500 MG tablet Take 500 mg by mouth 2 (two) times daily.     No current facility-administered medications for this visit.    Family History Family History  Problem  Relation Age of Onset  . Diabetes Mother   . Hypertension Mother   . Asthma Mother   . Asthma Brother   . Colon cancer Maternal Grandfather       Social History Social History   Tobacco Use  . Smoking status: Never Smoker  . Smokeless tobacco: Never Used  Substance Use Topics  . Alcohol use: No  . Drug use: No        Review of Systems  All other systems reviewed and are negative.     Physical Exam Blood pressure 108/70, pulse 87, temperature (!) 97.5 F (36.4 C), temperature source Temporal, height 5\' 1"  (1.549 m), weight 156 lb (70.8 kg), SpO2 98 %, not currently breastfeeding. Last Weight  Most recent update: 05/30/2019  4:08 PM   Weight  70.8 kg (156 lb)            CONSTITUTIONAL: Well developed, and nourished, appropriately responsive and aware without distress.   EYES: Sclera non-icteric.   EARS, NOSE, MOUTH AND THROAT: Mask worn.    Hearing is intact to voice.  NECK: Trachea is midline, and there is no jugular venous distension.  LYMPH NODES:  Lymph nodes in the neck are not enlarged. RESPIRATORY:  Lungs are clear, and breath sounds are equal bilaterally. Normal respiratory effort without pathologic use of accessory muscles. CARDIOVASCULAR: Heart is regular in rate and rhythm. GI: The abdomen is soft, nontender, and nondistended. There were no palpable masses.  I did not appreciate hepatosplenomegaly. There were normal bowel sounds. MUSCULOSKELETAL:  Symmetrical muscle tone appreciated in all four extremities.    SKIN: Skin turgor is normal. No pathologic skin lesions appreciated.  NEUROLOGIC:  Motor and sensation appear grossly normal.  Cranial nerves are grossly without defect. PSYCH:  Alert and oriented to person, place and time. Affect is appropriate for situation.  Data Reviewed I have personally reviewed what is currently available of the patient's imaging, recent labs and medical records.   Labs:  CBC Latest Ref Rng & Units 05/20/2019 12/16/2018 12/16/2018   WBC 4.0 - 10.5 K/uL 5.9 13.2(H) 14.9(H)  Hemoglobin 12.0 - 15.0 g/dL 78.9 8.0(L) 8.7(L)  Hematocrit 36.0 - 46.0 % 43.1 24.6(L) 25.8(L)  Platelets 150 - 400 K/uL 434(H) 201 208   CMP Latest Ref Rng & Units 05/20/2019 12/16/2018 12/16/2018  Glucose 70 - 99 mg/dL 94 381(O) 99  BUN 6 - 20 mg/dL 9 10 6   Creatinine 0.44 - 1.00 mg/dL 1.75 1.02  Sodium 135 - 145 mmol/L 138 137 138  Potassium 3.5 - 5.1 mmol/L 3.3(L) 3.7 3.7  Chloride 98 - 111 mmol/L 105 105 105  CO2 22 - 32 mmol/L 21(L) 24 26  Calcium 8.9 - 10.3 mg/dL 9.5 8.0(L) 8.4(L)  Total Protein 6.5 - 8.1 g/dL 8.3(H) 5.3(L) -  Total Bilirubin 0.3 - 1.2 mg/dL 5.85) 0.3 -  Alkaline Phos 38 - 126 U/L 92 115 -  AST 15 - 41 U/L 97(H) 18 -  ALT 0 - 44 U/L 203(H) 12 -      Imaging: Radiology review: CLINICAL DATA:  Elevated LFTs  EXAM: ULTRASOUND ABDOMEN LIMITED RIGHT UPPER QUADRANT  COMPARISON:  None.  FINDINGS: Gallbladder:  The gallbladder is contracted which limits evaluation. There appears to be at least 1 shadowing gallstone. The gallbladder wall is not thickened measuring approximately 2 mm. The sonographic 2.7(P sign is negative.  Common bile duct:  Diameter: 3 mm  Liver:  There is few echogenic foci scattered throughout the liver parenchyma. The liver parenchyma appears normal with respect echogenicity. There is no discrete hepatic mass. Portal vein is patent on color Doppler imaging with normal direction of blood flow towards the liver.  Other: None.  IMPRESSION: 1. There is cholelithiasis without secondary signs of acute cholecystitis. 2. There are few echogenic foci scattered throughout the liver parenchyma which are nonspecific but can be seen in patients with acute hepatitis. Correlation with laboratory studies is recommended.   Electronically Signed   By: Eulah Pont M.D.   On: 05/20/2019 22:20  Within last 24 hrs: No results found.  Assessment    Chronic calculus  cholecystitis with biliary colic. Patient Active Problem List   Diagnosis Date Noted  . Elevated blood pressure affecting pregnancy in third trimester, antepartum 12/13/2018  . Gestational hypertension 12/13/2018  . Positive GBS test 12/07/2018  . Pregnancy affected by fetal growth restriction 11/07/2018  . Threatened preterm labor 11/02/2018  . Indication for care in labor and delivery, antepartum 09/20/2018  . Pregnancy 09/20/2018  . Chlamydia trachomatis infection in mother during second trimester of pregnancy 09/06/2018  . Family history of Down syndrome   . Sickle cell trait in mother affecting pregnancy ([facility])   . BMI 30.0-30.9,adult 07/08/2018  . Bipolar I disorder ([facility]) 07/04/2018  . Gallstones 07/04/2018  . Vulvovaginitis due to yeast 07/04/2018  . Twin pregnancy, monochorionic/diamniotic, unspecified trimester 05/27/2018  . Obesity affecting pregnancy 05/27/2018  . Supervision of high-risk pregnancy 05/10/2018  . HSV infection  05/10/2018  . Nausea and vomiting during pregnancy 05/10/2018    Plan    Robotic cholecystectomy. The risks, benefits, potential complications, alternative treatment options, evaluations and diagnoses, and likely outcomes were discussed in detail with the patient. The possibility of anesthetic complications, bleeding, infection, finding a normal appearing gallbladder, trauma to adjacent organs, or injury to surrounding structures, bile leak or obstruction,  the need for additional procedures, reaction to medication, pulmonary aspiration, the possible need to convert to an open procedure, and issues requiring transfusion or further operations were discussed with the patient. Questions sought, and answered to satisfaction.  No guarantees were ever spoken or implied.  The patient and/or family concurred with the proposed plan, and gave informed consent.   Face-to-face time spent with the patient and accompanying care providers(if present) was 35 minutes, with  more than 50% of the time spent counseling, educating, and coordinating care of the patient.      Campbell Lerner M.D., FACS 05/30/2019, 4:52 PM

## 2019-05-31 ENCOUNTER — Telehealth: Payer: Self-pay | Admitting: Surgery

## 2019-05-31 NOTE — Telephone Encounter (Signed)
Pt has been advised of Pre-Admission date/time, COVID Testing date and Surgery date.  Surgery Date: 06/07/19 Preadmission Testing Date: 06/02/19 (phone 8a-1p) Covid Testing Date: 06/05/19 - patient advised to go to [facility] ([address] between 8a-1p   Patient has been made aware to call [phone], between 1-3:00pm the day before surgery, to find out what time to arrive for surgery.

## 2019-06-02 ENCOUNTER — Other Ambulatory Visit: Payer: Self-pay

## 2019-06-02 ENCOUNTER — Encounter
Admission: RE | Admit: 2019-06-02 | Discharge: 2019-06-02 | Disposition: A | Discharge status: Home / Self Care | Payer: Medicaid Other | Source: Ambulatory Visit | Attending: Surgery | Admitting: Surgery

## 2019-06-02 DIAGNOSIS — Z01818 Encounter for other preprocedural examination: Secondary | ICD-10-CM | POA: Diagnosis present

## 2019-06-02 NOTE — Patient Instructions (Addendum)
Your procedure is scheduled on: Wed. 5/26 Report to Day Surgery. To find out your arrival time please call [phone] between 1PM - 3PM on Tues. 5/25 .  Remember: Instructions that are not followed completely may result in serious medical risk,  up to and including death, or upon the discretion of your surgeon and anesthesiologist your  surgery may need to be rescheduled.     _X__ 1. Do not eat food after midnight the night before your procedure.                 No gum chewing or hard candies. You may drink clear liquids up to 2 hours                 before you are scheduled to arrive for your surgery- DO not drink clear                 liquids within 2 hours of the start of your surgery.                 Clear Liquids include:  water, apple juice without pulp, clear Gatorade, G2 or                  Gatorade Zero (avoid Red/Purple/Blue), Black Coffee or Tea (Do not add                 anything to coffee or tea). _____2.   Complete the carbohydrate drink provided to you, 2 hours before arrival.  __X__2.  On the morning of surgery brush your teeth with toothpaste and water, you                may rinse your mouth with mouthwash if you wish.  Do not swallow any toothpaste of mouthwash.     _X__ 3.  No Alcohol for 24 hours before or after surgery.   ___ 4.  Do Not Smoke or use e-cigarettes For 24 Hours Prior to Your Surgery.                 Do not use any chewable tobacco products for at least 6 hours prior to                 Surgery.  _X__  5.  Do not use any recreational drugs (marijuana, cocaine, heroin, ecstacy, MDMA or other)                For at least one week prior to your surgery.  Combination of these drugs with anesthesia                May have life threatening results.  ____  6.  Bring all medications with you on the day of surgery if instructed.   __x__  7.  Notify your doctor if there is any change in your medical condition      (cold, fever,  infections).     Do not wear jewelry, make-up, hairpins, clips or nail polish. Do not wear lotions, powders, or perfumes.  Do not shave 48 hours prior to surgery.  Do not bring valuables to the hospital.    [facility] is not responsible for any belongings or valuables.  Contacts, dentures or bridgework may not be worn into surgery. Leave your suitcase in the car. After surgery it may be brought to your room. For patients admitted to the hospital, discharge time is determined by your treatment team.   Patients discharged the day  of surgery will not be allowed to drive home.   Make arrangements for someone to be with you for the first 24 hours of your Same Day Discharge.    Please read over the following fact sheets that you were given:    __x__ Take these medicines the morning of surgery with A SIP OF WATER:    1. May take pain and nausea medication if needed  2.   3.   4.  5.  6.  ____ Fleet Enema (as directed)   _x_ Use CHG Soap (or wipes) as directed  ____ Use Benzoyl Peroxide Gel as instructed  ____ Use inhalers on the day of surgery  ____ Stop metformin 2 days prior to surgery    ____ Take 1/2 of usual insulin dose the night before surgery. No insulin the morning          of surgery.   ____ Stop Coumadin/Plavix/aspirin on   __x__ Stop Anti-inflammatories No ibuprofen aleve or aspirin until aspirin     May take tylenol or pain medication   ____ Stop supplements until after surgery.    ____ Bring C-Pap to the hospital.

## 2019-06-05 ENCOUNTER — Other Ambulatory Visit: Payer: Self-pay

## 2019-06-05 ENCOUNTER — Other Ambulatory Visit
Admission: RE | Admit: 2019-06-05 | Discharge: 2019-06-05 | Disposition: A | Discharge status: Home / Self Care | Payer: Medicaid Other | Source: Ambulatory Visit | Attending: Surgery | Admitting: Surgery

## 2019-06-05 DIAGNOSIS — Z01812 Encounter for preprocedural laboratory examination: Secondary | ICD-10-CM | POA: Insufficient documentation

## 2019-06-05 DIAGNOSIS — Z20822 Contact with and (suspected) exposure to covid-19: Secondary | ICD-10-CM | POA: Diagnosis not present

## 2019-06-05 LAB — SARS CORONAVIRUS 2 (TAT 6-24 HRS): SARS Coronavirus 2: NEGATIVE

## 2019-06-06 MED ORDER — INDOCYANINE GREEN 25 MG IV SOLR
2.5000 mg | Freq: Once | INTRAVENOUS | Status: AC
  Administered 2019-06-07: 2.5 mg via INTRAVENOUS
  Filled 2019-06-06: qty 10

## 2019-06-07 ENCOUNTER — Encounter: Payer: Self-pay | Admitting: Surgery

## 2019-06-07 ENCOUNTER — Other Ambulatory Visit: Payer: Self-pay

## 2019-06-07 ENCOUNTER — Encounter
Admission: RE | Disposition: A | Discharge status: Home / Self Care | Payer: Self-pay | Source: Home / Self Care | Attending: Surgery

## 2019-06-07 ENCOUNTER — Ambulatory Visit: Payer: Medicaid Other | Admitting: Anesthesiology

## 2019-06-07 ENCOUNTER — Ambulatory Visit
Admission: RE | Admit: 2019-06-07 | Discharge: 2019-06-07 | Disposition: A | Discharge status: Home / Self Care | Payer: Medicaid Other | Attending: Surgery | Admitting: Surgery

## 2019-06-07 DIAGNOSIS — I10 Essential (primary) hypertension: Secondary | ICD-10-CM | POA: Insufficient documentation

## 2019-06-07 DIAGNOSIS — F319 Bipolar disorder, unspecified: Secondary | ICD-10-CM | POA: Insufficient documentation

## 2019-06-07 DIAGNOSIS — K8010 Calculus of gallbladder with chronic cholecystitis without obstruction: Secondary | ICD-10-CM | POA: Diagnosis present

## 2019-06-07 DIAGNOSIS — F17210 Nicotine dependence, cigarettes, uncomplicated: Secondary | ICD-10-CM | POA: Insufficient documentation

## 2019-06-07 DIAGNOSIS — D573 Sickle-cell trait: Secondary | ICD-10-CM | POA: Diagnosis not present

## 2019-06-07 LAB — POCT PREGNANCY, URINE: Preg Test, Ur: NEGATIVE

## 2019-06-07 SURGERY — CHOLECYSTECTOMY, ROBOT-ASSISTED, LAPAROSCOPIC
Anesthesia: General

## 2019-06-07 MED ORDER — LACTATED RINGERS IV SOLN: INTRAVENOUS | Status: DC | PRN

## 2019-06-07 MED ORDER — FAMOTIDINE 20 MG PO TABS
ORAL_TABLET | ORAL | Status: AC
  Administered 2019-06-07: 20 mg
  Filled 2019-06-07: qty 1

## 2019-06-07 MED ORDER — FAMOTIDINE 20 MG PO TABS: 20.0000 mg | ORAL_TABLET | Freq: Once | ORAL | Status: DC

## 2019-06-07 MED ORDER — EPHEDRINE 5 MG/ML INJ
INTRAVENOUS | Status: AC
  Filled 2019-06-07: qty 10

## 2019-06-07 MED ORDER — MIDAZOLAM HCL 2 MG/2ML IJ SOLN
INTRAMUSCULAR | Status: DC | PRN
  Administered 2019-06-07: 2 mg via INTRAVENOUS

## 2019-06-07 MED ORDER — HYDROCODONE-ACETAMINOPHEN 5-325 MG PO TABS
1.0000 | ORAL_TABLET | ORAL | Status: DC | PRN
  Administered 2019-06-07: 1 via ORAL

## 2019-06-07 MED ORDER — DEXMEDETOMIDINE HCL 200 MCG/2ML IV SOLN
INTRAVENOUS | Status: DC | PRN
  Administered 2019-06-07: 16 ug via INTRAVENOUS

## 2019-06-07 MED ORDER — LIDOCAINE HCL (CARDIAC) PF 100 MG/5ML IV SOSY
PREFILLED_SYRINGE | INTRAVENOUS | Status: DC | PRN
  Administered 2019-06-07: 100 mg via INTRAVENOUS

## 2019-06-07 MED ORDER — CHLORHEXIDINE GLUCONATE CLOTH 2 % EX PADS: 6.0000 | MEDICATED_PAD | Freq: Once | CUTANEOUS | Status: DC

## 2019-06-07 MED ORDER — DEXAMETHASONE SODIUM PHOSPHATE 10 MG/ML IJ SOLN
INTRAMUSCULAR | Status: DC | PRN
  Administered 2019-06-07: 10 mg via INTRAVENOUS

## 2019-06-07 MED ORDER — GABAPENTIN 300 MG PO CAPS
ORAL_CAPSULE | ORAL | Status: AC
  Administered 2019-06-07: 300 mg
  Filled 2019-06-07: qty 1

## 2019-06-07 MED ORDER — CHLORHEXIDINE GLUCONATE 0.12 % MT SOLN: 15.0000 mL | Freq: Once | OROMUCOSAL | Status: DC

## 2019-06-07 MED ORDER — ORAL CARE MOUTH RINSE: 15.0000 mL | Freq: Once | OROMUCOSAL | Status: DC

## 2019-06-07 MED ORDER — ONDANSETRON HCL 4 MG/2ML IJ SOLN
INTRAMUSCULAR | Status: DC | PRN
  Administered 2019-06-07: 4 mg via INTRAVENOUS

## 2019-06-07 MED ORDER — HYDROCODONE-ACETAMINOPHEN 5-325 MG PO TABS: 1.0000 | ORAL_TABLET | ORAL | 0 refills | Status: DC | PRN

## 2019-06-07 MED ORDER — ACETAMINOPHEN 500 MG PO TABS
ORAL_TABLET | ORAL | Status: AC
  Administered 2019-06-07: 1000 mg
  Filled 2019-06-07: qty 2

## 2019-06-07 MED ORDER — DEXAMETHASONE SODIUM PHOSPHATE 10 MG/ML IJ SOLN
INTRAMUSCULAR | Status: AC
  Filled 2019-06-07: qty 1

## 2019-06-07 MED ORDER — FENTANYL CITRATE (PF) 100 MCG/2ML IJ SOLN
INTRAMUSCULAR | Status: AC
  Filled 2019-06-07: qty 2

## 2019-06-07 MED ORDER — CELECOXIB 200 MG PO CAPS
ORAL_CAPSULE | ORAL | Status: AC
  Administered 2019-06-07: 200 mg
  Filled 2019-06-07: qty 1

## 2019-06-07 MED ORDER — CELECOXIB 200 MG PO CAPS: 200.0000 mg | ORAL_CAPSULE | ORAL | Status: DC

## 2019-06-07 MED ORDER — CEFAZOLIN SODIUM-DEXTROSE 2-4 GM/100ML-% IV SOLN
2.0000 g | INTRAVENOUS | Status: AC
  Administered 2019-06-07: 2 g via INTRAVENOUS

## 2019-06-07 MED ORDER — IBUPROFEN 800 MG PO TABS
800.0000 mg | ORAL_TABLET | Freq: Three times a day (TID) | ORAL | 0 refills | Status: DC | PRN
Start: 2019-06-07 — End: 2019-10-10

## 2019-06-07 MED ORDER — BUPIVACAINE-EPINEPHRINE (PF) 0.25% -1:200000 IJ SOLN
INTRAMUSCULAR | Status: DC | PRN
  Administered 2019-06-07: 30 mL

## 2019-06-07 MED ORDER — BUPIVACAINE LIPOSOME 1.3 % IJ SUSP: 20.0000 mL | Freq: Once | INTRAMUSCULAR | Status: DC

## 2019-06-07 MED ORDER — KETOROLAC TROMETHAMINE 30 MG/ML IJ SOLN
INTRAMUSCULAR | Status: AC
  Filled 2019-06-07: qty 1

## 2019-06-07 MED ORDER — PROPOFOL 500 MG/50ML IV EMUL
INTRAVENOUS | Status: AC
  Filled 2019-06-07: qty 50

## 2019-06-07 MED ORDER — BUPIVACAINE-EPINEPHRINE (PF) 0.25% -1:200000 IJ SOLN
INTRAMUSCULAR | Status: AC
  Filled 2019-06-07: qty 30

## 2019-06-07 MED ORDER — DEXMEDETOMIDINE HCL IN NACL 80 MCG/20ML IV SOLN
INTRAVENOUS | Status: AC
  Filled 2019-06-07: qty 20

## 2019-06-07 MED ORDER — SUCCINYLCHOLINE CHLORIDE 200 MG/10ML IV SOSY
PREFILLED_SYRINGE | INTRAVENOUS | Status: AC
  Filled 2019-06-07: qty 20

## 2019-06-07 MED ORDER — ONDANSETRON HCL 4 MG/2ML IJ SOLN: 4.0000 mg | Freq: Once | INTRAMUSCULAR | Status: DC | PRN

## 2019-06-07 MED ORDER — MIDAZOLAM HCL 2 MG/2ML IJ SOLN
INTRAMUSCULAR | Status: AC
  Filled 2019-06-07: qty 2

## 2019-06-07 MED ORDER — ROCURONIUM BROMIDE 10 MG/ML (PF) SYRINGE
PREFILLED_SYRINGE | INTRAVENOUS | Status: AC
  Filled 2019-06-07: qty 20

## 2019-06-07 MED ORDER — HYDROCODONE-ACETAMINOPHEN 5-325 MG PO TABS
ORAL_TABLET | ORAL | Status: AC
  Filled 2019-06-07: qty 1

## 2019-06-07 MED ORDER — SUGAMMADEX SODIUM 500 MG/5ML IV SOLN
INTRAVENOUS | Status: AC
  Filled 2019-06-07: qty 5

## 2019-06-07 MED ORDER — ONDANSETRON HCL 4 MG/2ML IJ SOLN
INTRAMUSCULAR | Status: AC
  Filled 2019-06-07: qty 2

## 2019-06-07 MED ORDER — ACETAMINOPHEN 500 MG PO TABS: 1000.0000 mg | ORAL_TABLET | ORAL | Status: DC

## 2019-06-07 MED ORDER — CEFAZOLIN SODIUM 1 G IJ SOLR
INTRAMUSCULAR | Status: AC
  Filled 2019-06-07: qty 20

## 2019-06-07 MED ORDER — FENTANYL CITRATE (PF) 100 MCG/2ML IJ SOLN
25.0000 ug | INTRAMUSCULAR | Status: DC | PRN
  Administered 2019-06-07 (×4): 25 ug via INTRAVENOUS

## 2019-06-07 MED ORDER — PROPOFOL 10 MG/ML IV BOLUS
INTRAVENOUS | Status: DC | PRN
  Administered 2019-06-07: 130 mg via INTRAVENOUS

## 2019-06-07 MED ORDER — SUGAMMADEX SODIUM 500 MG/5ML IV SOLN
INTRAVENOUS | Status: DC | PRN
  Administered 2019-06-07: 141.6 mg via INTRAVENOUS

## 2019-06-07 MED ORDER — CEFAZOLIN SODIUM-DEXTROSE 2-4 GM/100ML-% IV SOLN
INTRAVENOUS | Status: AC
  Filled 2019-06-07: qty 100

## 2019-06-07 MED ORDER — PHENYLEPHRINE HCL (PRESSORS) 10 MG/ML IV SOLN
INTRAVENOUS | Status: AC
  Filled 2019-06-07: qty 1

## 2019-06-07 MED ORDER — LACTATED RINGERS IV SOLN: INTRAVENOUS | Status: DC

## 2019-06-07 MED ORDER — LIDOCAINE HCL (PF) 2 % IJ SOLN
INTRAMUSCULAR | Status: AC
  Filled 2019-06-07: qty 5

## 2019-06-07 MED ORDER — GABAPENTIN 300 MG PO CAPS: 300.0000 mg | ORAL_CAPSULE | ORAL | Status: DC

## 2019-06-07 MED ORDER — FENTANYL CITRATE (PF) 100 MCG/2ML IJ SOLN
INTRAMUSCULAR | Status: DC | PRN
  Administered 2019-06-07: 100 ug via INTRAVENOUS

## 2019-06-07 MED ORDER — ROCURONIUM BROMIDE 100 MG/10ML IV SOLN
INTRAVENOUS | Status: DC | PRN
  Administered 2019-06-07: 50 mg via INTRAVENOUS

## 2019-06-07 SURGICAL SUPPLY — 37 items
CANISTER SUCT 1200ML W/VALVE (MISCELLANEOUS) ×3 IMPLANT
CHLORAPREP W/TINT 26 (MISCELLANEOUS) ×3 IMPLANT
CLIP VESOLOCK MED LG 6/CT (CLIP) ×3 IMPLANT
COVER TIP SHEARS 8 DVNC (MISCELLANEOUS) ×2 IMPLANT
COVER TIP SHEARS 8MM DA VINCI (MISCELLANEOUS) ×1
COVER WAND RF STERILE (DRAPES) ×3 IMPLANT
DECANTER SPIKE VIAL GLASS SM (MISCELLANEOUS) ×3 IMPLANT
DEFOGGER SCOPE WARMER CLEARIFY (MISCELLANEOUS) ×3 IMPLANT
DERMABOND ADVANCED (GAUZE/BANDAGES/DRESSINGS) ×1
DERMABOND ADVANCED .7 DNX12 (GAUZE/BANDAGES/DRESSINGS) ×2 IMPLANT
DRAPE ARM DVNC X/XI (DISPOSABLE) ×8 IMPLANT
DRAPE COLUMN DVNC XI (DISPOSABLE) ×2 IMPLANT
DRAPE DA VINCI XI ARM (DISPOSABLE) ×4
DRAPE DA VINCI XI COLUMN (DISPOSABLE) ×1
GLOVE ORTHO TXT STRL SZ7.5 (GLOVE) ×6 IMPLANT
GOWN STRL REUS W/ TWL LRG LVL3 (GOWN DISPOSABLE) ×8 IMPLANT
GOWN STRL REUS W/TWL LRG LVL3 (GOWN DISPOSABLE) ×4
GRASPER SUT TROCAR 14GX15 (MISCELLANEOUS) IMPLANT
IRRIGATION STRYKERFLOW (MISCELLANEOUS) IMPLANT
IRRIGATOR STRYKERFLOW (MISCELLANEOUS)
IV NS IRRIG 3000ML ARTHROMATIC (IV SOLUTION) IMPLANT
KIT PINK PAD W/HEAD ARE REST (MISCELLANEOUS) ×3
KIT PINK PAD W/HEAD ARM REST (MISCELLANEOUS) ×2 IMPLANT
KIT TURNOVER KIT A (KITS) ×3 IMPLANT
LABEL OR SOLS (LABEL) ×3 IMPLANT
NEEDLE HYPO 22GX1.5 SAFETY (NEEDLE) ×3 IMPLANT
NEEDLE INSUFFLATION 14GA 120MM (NEEDLE) IMPLANT
NS IRRIG 500ML POUR BTL (IV SOLUTION) ×3 IMPLANT
PACK LAP CHOLECYSTECTOMY (MISCELLANEOUS) ×3 IMPLANT
POUCH SPECIMEN RETRIEVAL 10MM (ENDOMECHANICALS) ×3 IMPLANT
SEAL CANN UNIV 5-8 DVNC XI (MISCELLANEOUS) ×8 IMPLANT
SEAL XI 5MM-8MM UNIVERSAL (MISCELLANEOUS) ×4
SET TUBE SMOKE EVAC HIGH FLOW (TUBING) ×3 IMPLANT
SOLUTION ELECTROLUBE (MISCELLANEOUS) ×3 IMPLANT
SUT MNCRL AB 4-0 PS2 18 (SUTURE) ×3 IMPLANT
SUT VICRYL 0 AB UR-6 (SUTURE) ×3 IMPLANT
TROCAR Z-THREAD FIOS 11X100 BL (TROCAR) ×3 IMPLANT

## 2019-06-07 NOTE — Transfer of Care (Signed)
Immediate Anesthesia Transfer of Care Note  Patient: Katelyn Mendoza  Procedure(s) Performed: XI ROBOTIC ASSISTED LAPAROSCOPIC CHOLECYSTECTOMY (N/A ) INDOCYANINE GREEN FLUORESCENCE IMAGING (ICG)  Patient Location: PACU  Anesthesia Type:General  Level of Consciousness: sedated  Airway & Oxygen Therapy: Patient Spontanous Breathing and Patient connected to face mask oxygen  Post-op Assessment: Report given to RN and Post -op Vital signs reviewed and stable  Post vital signs: Reviewed and stable  Last Vitals:  Vitals Value Taken Time  BP 104/63 06/07/19 1344  Temp 36.3 C 06/07/19 1343  Pulse 86 06/07/19 1356  Resp 15 06/07/19 1356  SpO2 100 % 06/07/19 1356  Vitals shown include unvalidated device data.  Last Pain:  Vitals:   06/07/19 1343  TempSrc:   PainSc: Asleep         Complications: No apparent anesthesia complications

## 2019-06-07 NOTE — Op Note (Signed)
Robotic cholecystectomy  Pre-operative Diagnosis: Chronic calculus cholecystitis  Post-operative Diagnosis:  Same.  Procedure: Robotic assisted laparoscopic cholecystectomy.  Surgeon: Ronny Bacon, M.D., FACS  Anesthesia: General. with endotracheal tube  Findings: Hourglass shaped neck with cystic duct dilatation with impacted stone/sludge.  Estimated Blood Loss: 5 mL         Drains: None         Specimens: Gallbladder           Complications: none  Procedure Details  The patient was seen again in the Holding Room.  1.25 mg dose of ICG was administered intravenously.  The benefits, complications, treatment options, risks and expected outcomes were discussed with the patient. The likelihood of improving the patient's symptoms with return to their baseline status is good.  The patient and/or family concurred with the proposed plan, giving informed consent, again alternatives reviewed.  The patient was taken to Operating Room, identified, and the procedure verified as robotic assisted laparoscopic cholecystectomy.  Prior to the induction of general anesthesia, antibiotic prophylaxis was administered. VTE prophylaxis was in place. General endotracheal anesthesia was then administered and tolerated well. The patient was positioned in the supine position.  After the induction, the abdomen was prepped with Chloraprep and draped in the sterile fashion.  A Time Out was held and the above information confirmed. Right infra-umbilical local infiltration with quarter percent Marcaine with epinephrine is utilized.  Made a 12 mm incision on the left periumbilical site, I advanced an optical 10mm port under direct visualization into the peritoneal cavity.  Once the peritoneum was penetrated, insufflation was maintained at 14 mmHg with carbon dioxide.  The trocar was then advanced into the abdominal cavity under direct visualization. Pneumoperitoneum was then continued with CO2 at 14 mmHg or less and  tolerated well without any adverse changes in the patient's vital signs.  Two 8.5-mm ports were placed in the left lower quadrant and laterally, and one to the right lower quadrant, all under direct vision. All skin incisions  were infiltrated with a local anesthetic agent before making the incision and placing the trocars.  The patient was positioned  in reverse Trendelenburg, tilted the patient's left side down.  Da Vinci XI robot was then positioned on to the patient's left side, and docked.  The gallbladder was identified, the fundus grasped via the arm 4 Prograsp and retracted cephalad. Adhesions were lysed with scissors and cautery.  The infundibulum was identified grasped, extensive dissection identified by firefly to find a lower infundibulum's/dilated cystic duct, meticulous dissection was performed mobilizing the neck and cystic duct and retracted laterally, dissection of the peritoneum overlying the triangle of Calot, enabled isolation of the cystic artery. This was then further dissected using cautery & scissors. An extended critical view of the cystic duct and cystic artery was obtained, aided by the ICG via FireFly which enabled ready visualization of the ductal anatomy.  Photos taken.  I milked would have her stones and sludge back in the gallbladder as I could to decompress the junction of the cystic duct with the common bile duct.  The cystic duct was clearly identified and dissected to isolation at its junction with the common bile duct.   Artery well isolated and clipped, and the cystic duct was triple clipped and divided with scissors, leaving two on the remaining stump, which was the largest size I could imagine still controlled by the clips.   The gallbladder was taken from the gallbladder fossa in a retrograde fashion with the electrocautery.  The gallbladder was removed and placed in an Endocatch bag.  Hemostasis was confirmed with the electrocautery.  The robot was undocked and moved  away from the operative field. The gallbladder and Endocatch sac were then removed through the infraumbilical port site.   Inspection of the right upper quadrant was performed. No bleeding, bile duct injury or leak, or bowel injury was noted. The infra-umbilical port site fascia was closed with interrumpted 0 Vicryl sutures using PMI/cone under direct visualization. Pneumoperitoneum was released and ports removed.  4-0 subcuticular Monocryl was used to close the skin. Dermabond was  applied.  The patient was then extubated and brought to the recovery room in stable condition. Sponge, lap, and needle counts were correct at closure and at the conclusion of the case.               Campbell Lerner, M.D., [facility] 06/07/2019 1:48 PM

## 2019-06-07 NOTE — Anesthesia Postprocedure Evaluation (Signed)
Anesthesia Post Note  Patient: Katelyn Mendoza  Procedure(s) Performed: XI ROBOTIC ASSISTED LAPAROSCOPIC CHOLECYSTECTOMY (N/A ) INDOCYANINE GREEN FLUORESCENCE IMAGING (ICG)  Patient location during evaluation: PACU Anesthesia Type: General Level of consciousness: awake and alert and oriented Pain management: pain level controlled Vital Signs Assessment: post-procedure vital signs reviewed and stable Respiratory status: spontaneous breathing Cardiovascular status: blood pressure returned to baseline Anesthetic complications: no     Last Vitals:  Vitals:   06/07/19 1436 06/07/19 1451  BP: 107/60 (!) 105/56  Pulse: 65 68  Resp: 16 16  Temp: (!) 36.4 C   SpO2: 100% 100%    Last Pain:  Vitals:   06/07/19 1451  TempSrc:   PainSc: 7                  Joselle Deeds

## 2019-06-07 NOTE — Interval H&P Note (Signed)
History and Physical Interval Note:  06/07/2019 11:06 AM  Katelyn Mendoza  has presented today for surgery, with the diagnosis of Cholelithiasis.  The various methods of treatment have been discussed with the patient and family. After consideration of risks, benefits and other options for treatment, the patient has consented to  Procedure(s): XI ROBOTIC ASSISTED LAPAROSCOPIC CHOLECYSTECTOMY (N/A) as a surgical intervention.  The patient's history has been reviewed, patient examined, no change in status, stable for surgery.  I have reviewed the patient's chart and labs.  Questions were answered to the patient's satisfaction.     Campbell Lerner, M.D., [facility]  06/07/2019 ; 11:07 AM

## 2019-06-07 NOTE — Anesthesia Procedure Notes (Signed)
Procedure Name: Intubation Date/Time: 06/07/2019 12:07 PM Performed by: Almeta Monas, CRNA Pre-anesthesia Checklist: Patient identified, Patient being monitored, Timeout performed, Emergency Drugs available and Suction available Patient Re-evaluated:Patient Re-evaluated prior to induction Oxygen Delivery Method: Circle system utilized Preoxygenation: Pre-oxygenation with 100% oxygen Induction Type: IV induction Ventilation: Mask ventilation without difficulty Laryngoscope Size: 3 and McGraph Grade View: Grade I Tube type: Oral Tube size: 7.0 mm Number of attempts: 1 Airway Equipment and Method: Stylet and Video-laryngoscopy Placement Confirmation: ETT inserted through vocal cords under direct vision,  positive ETCO2 and breath sounds checked- equal and bilateral Secured at: 21 cm Tube secured with: Tape Dental Injury: Teeth and Oropharynx as per pre-operative assessment  Future Recommendations: Recommend- induction with short-acting agent, and alternative techniques readily available

## 2019-06-07 NOTE — Discharge Instructions (Addendum)
Your incision was closed with Dermabond.  It is best to keep it clean and dry, it will tolerate a brief shower, but do not soak it or apply any creams or lotions to the incisions.  The Dermabond should gradually flake off over time.  Keep it open to air so you can evaluate your incisions.  Dermabond assists the underlying sutures to keep your incision closed and protected from infection.  Should you develop some drainage from your incision, some drops of drainage would be okay but if it persists continue to put keep a dry dressing over it.   AMBULATORY SURGERY  DISCHARGE INSTRUCTIONS   1) The drugs that you were given will stay in your system until tomorrow so for the next 24 hours you should not:  A) Drive an automobile B) Make any legal decisions C) Drink any alcoholic beverage   2) You may resume regular meals tomorrow.  Today it is better to start with liquids and gradually work up to solid foods.  You may eat anything you prefer, but it is better to start with liquids, then soup and crackers, and gradually work up to solid foods.   3) Please notify your doctor immediately if you have any unusual bleeding, trouble breathing, redness and pain at the surgery site, drainage, fever, or pain not relieved by medication.    4) Additional Instructions:        Please contact your physician with any problems or Same Day Surgery at [phone], Monday through Friday 6 am to 4 pm, or [facility] number at [phone]. 

## 2019-06-07 NOTE — Anesthesia Preprocedure Evaluation (Signed)
Anesthesia Evaluation  Patient identified by MRN, date of birth, ID band Patient awake    Reviewed: Allergy & Precautions, H&P , NPO status , Patient's Chart, lab work & pertinent test results, reviewed documented beta blocker date and time   History of Anesthesia Complications (+) POST - OP SPINAL HEADACHE and history of anesthetic complications  Airway Mallampati: II  TM Distance: >3 FB Neck ROM: full    Dental no notable dental hx. (+) Teeth Intact   Pulmonary neg pulmonary ROS, Current Smoker,    Pulmonary exam normal breath sounds clear to auscultation       Cardiovascular Exercise Tolerance: Good hypertension, On Medications  Rhythm:regular Rate:Normal     Neuro/Psych  Headaches, PSYCHIATRIC DISORDERS Bipolar Disorder    GI/Hepatic negative GI ROS, Neg liver ROS,   Endo/Other  negative endocrine ROSdiabetes, Gestational  Renal/GU      Musculoskeletal   Abdominal   Peds  Hematology negative hematology ROS (+)   Anesthesia Other Findings   Reproductive/Obstetrics (+) Pregnancy                             Anesthesia Physical  Anesthesia Plan  ASA: II  Anesthesia Plan: General   Post-op Pain Management:    Induction: Intravenous  PONV Risk Score and Plan:   Airway Management Planned: Oral ETT  Additional Equipment:   Intra-op Plan:   Post-operative Plan: Extubation in OR  Informed Consent: I have reviewed the patients History and Physical, chart, labs and discussed the procedure including the risks, benefits and alternatives for the proposed anesthesia with the patient or authorized representative who has indicated his/her understanding and acceptance.       Plan Discussed with: CRNA and Surgeon  Anesthesia Plan Comments:         Anesthesia Quick Evaluation

## 2019-06-08 LAB — SURGICAL PATHOLOGY

## 2019-06-10 ENCOUNTER — Emergency Department: Payer: Medicaid Other

## 2019-06-10 ENCOUNTER — Other Ambulatory Visit: Payer: Self-pay

## 2019-06-10 DIAGNOSIS — K5900 Constipation, unspecified: Secondary | ICD-10-CM | POA: Diagnosis not present

## 2019-06-10 DIAGNOSIS — Z5321 Procedure and treatment not carried out due to patient leaving prior to being seen by health care provider: Secondary | ICD-10-CM | POA: Diagnosis not present

## 2019-06-10 DIAGNOSIS — R1010 Upper abdominal pain, unspecified: Secondary | ICD-10-CM | POA: Diagnosis present

## 2019-06-10 DIAGNOSIS — R1110 Vomiting, unspecified: Secondary | ICD-10-CM | POA: Insufficient documentation

## 2019-06-10 LAB — URINALYSIS, COMPLETE (UACMP) WITH MICROSCOPIC
Bilirubin Urine: NEGATIVE
Glucose, UA: NEGATIVE mg/dL
Hgb urine dipstick: NEGATIVE
Ketones, ur: NEGATIVE mg/dL
Nitrite: NEGATIVE
Protein, ur: NEGATIVE mg/dL
Specific Gravity, Urine: 1.017 (ref 1.005–1.030)
pH: 5 (ref 5.0–8.0)

## 2019-06-10 LAB — COMPREHENSIVE METABOLIC PANEL
ALT: 26 U/L (ref 0–44)
AST: 31 U/L (ref 15–41)
Albumin: 3.8 g/dL (ref 3.5–5.0)
Alkaline Phosphatase: 95 U/L (ref 38–126)
Anion gap: 10 (ref 5–15)
BUN: 11 mg/dL (ref 6–20)
CO2: 26 mmol/L (ref 22–32)
Calcium: 9 mg/dL (ref 8.9–10.3)
Chloride: 100 mmol/L (ref 98–111)
Creatinine, Ser: 0.65 mg/dL (ref 0.44–1.00)
GFR calc Af Amer: >60 mL/min (ref >60)
GFR calc non Af Amer: >60 mL/min (ref >60)
Glucose, Bld: 82 mg/dL (ref 70–99)
Potassium: 3.7 mmol/L (ref 3.5–5.1)
Sodium: 136 mmol/L (ref 135–145)
Total Bilirubin: 0.7 mg/dL (ref 0.3–1.2)
Total Protein: 7.3 g/dL (ref 6.5–8.1)

## 2019-06-10 LAB — CBC
HCT: 37.2 % (ref 36.0–46.0)
Hemoglobin: 12.4 g/dL (ref 12.0–15.0)
MCH: 26.7 pg (ref 26.0–34.0)
MCHC: 33.3 g/dL (ref 30.0–36.0)
MCV: 80.2 fL (ref 80.0–100.0)
Platelets: 389 10*3/uL (ref 150–400)
RBC: 4.64 MIL/uL (ref 3.87–5.11)
RDW: 13.4 % (ref 11.5–15.5)
WBC: 7.4 10*3/uL (ref 4.0–10.5)
nRBC: 0 % (ref 0.0–0.2)

## 2019-06-10 LAB — POCT PREGNANCY, URINE: Preg Test, Ur: NEGATIVE

## 2019-06-10 LAB — LIPASE, BLOOD: Lipase: 23 U/L (ref 11–51)

## 2019-06-10 NOTE — ED Triage Notes (Signed)
Pt arrives EMS for upper abd pain and constipation. No BM since Tuesday. Gallbladder removed Wednesday, taking pain meds at home (hydrocodone when EMS arrived). States vomiting as well. A&O, in wheelchair.

## 2019-06-11 ENCOUNTER — Emergency Department: Payer: Medicaid Other

## 2019-06-11 ENCOUNTER — Emergency Department
Admission: EM | Admit: 2019-06-11 | Discharge: 2019-06-11 | Disposition: A | Discharge status: Home / Self Care | Payer: Medicaid Other | Attending: Emergency Medicine | Admitting: Emergency Medicine

## 2019-06-11 NOTE — ED Notes (Signed)
No answer when called 

## 2019-06-13 ENCOUNTER — Other Ambulatory Visit: Payer: Self-pay

## 2019-06-13 ENCOUNTER — Telehealth: Payer: Self-pay | Admitting: *Deleted

## 2019-06-13 NOTE — Telephone Encounter (Signed)
Patient called and wanted to pick up a return to work note for returning to work tomorrow

## 2019-06-13 NOTE — Telephone Encounter (Signed)
Letter completed and placed at the front desk. Patient notified and made aware that the office closes at 5pm.

## 2019-06-22 ENCOUNTER — Encounter: Payer: Self-pay | Admitting: Surgery

## 2019-06-22 ENCOUNTER — Ambulatory Visit (INDEPENDENT_AMBULATORY_CARE_PROVIDER_SITE_OTHER): Payer: Self-pay | Admitting: Surgery

## 2019-06-22 ENCOUNTER — Other Ambulatory Visit: Payer: Self-pay

## 2019-06-22 VITALS — BP 96/56 | HR 66 | Temp 97.7°F | Ht 61.0 in | Wt 157.2 lb

## 2019-06-22 DIAGNOSIS — Z9049 Acquired absence of other specified parts of digestive tract: Secondary | ICD-10-CM | POA: Insufficient documentation

## 2019-06-22 NOTE — Patient Instructions (Signed)
Patient is to refrain from any heavy lifting, bending, pushing or pulling of 20 pounds or more for a total of six weeks.  Follow-up with our office as needed.  Please call and ask to speak with a nurse if you develop questions or concerns.

## 2019-06-22 NOTE — Progress Notes (Signed)
[facility] POST-OP OFFICE VISIT  06/22/2019  HPI: Katelyn Mendoza is a 29 y.o. female 2 weeks s/p robotic cholecystectomy.  She complains of sharp midsternal pain that comes on spontaneously lasts for 10 minutes or so and resolves on its own.  She denies it being a burning, she denies its relationship to eating or swallowing.  She also reports a lingering left upper shoulder pain.  And some discomfort in the area of her gallbladder still.  She denies any nausea, vomiting, chills or fever.  She denies constipation and diarrhea.  She is still caring for 2 twins currently about 6 months out.  She is doing some lifting of both of these children which may be adding some degree of strain.  Vital signs: BP (!) 96/56   Pulse 66   Temp 97.7 F (36.5 C) (Temporal)   Ht 5\' 1"  (1.549 m)   Wt 157 lb 3.2 oz (71.3 kg)   SpO2 98%   BMI 29.70 kg/m    Physical Exam: Constitutional: She appears well. Abdomen: Rounded, nondistended, soft and nontender. Skin: Incisions are clean dry and intact.  Assessment/Plan: This is a 29 y.o. female 2 weeks s/p robotic cholecystectomy.  Patient Active Problem List   Diagnosis Date Noted  . Status post laparoscopic cholecystectomy 06/22/2019  . Elevated blood pressure affecting pregnancy in third trimester, antepartum 12/13/2018  . Gestational hypertension 12/13/2018  . Positive GBS test 12/07/2018  . Pregnancy affected by fetal growth restriction 11/07/2018  . Threatened preterm labor 11/02/2018  . Indication for care in labor and delivery, antepartum 09/20/2018  . Pregnancy 09/20/2018  . Chlamydia trachomatis infection in mother during second trimester of pregnancy 09/06/2018  . Family history of Down syndrome   . Sickle cell trait in mother affecting pregnancy (HCC)   . BMI 30.0-30.9,adult 07/08/2018  . Bipolar I disorder (HCC) 07/04/2018  . Vulvovaginitis due to yeast 07/04/2018  . Twin pregnancy, monochorionic/diamniotic, unspecified  trimester 05/27/2018  . Obesity affecting pregnancy 05/27/2018  . Supervision of high-risk pregnancy 05/10/2018  . 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 Nausea and vomiting during pregnancy 05/10/2018    -Encouragement given, anticipate improvement with time.  Follow-up as needed.      05/12/2018 M.D., FACS 06/22/2019, 2:42 PM

## 2019-06-28 ENCOUNTER — Ambulatory Visit: Payer: Medicaid Other | Admitting: Family Medicine

## 2019-06-28 ENCOUNTER — Other Ambulatory Visit: Payer: Self-pay

## 2019-06-28 ENCOUNTER — Encounter: Payer: Self-pay | Admitting: Family Medicine

## 2019-06-28 DIAGNOSIS — Z113 Encounter for screening for infections with a predominantly sexual mode of transmission: Secondary | ICD-10-CM

## 2019-06-28 LAB — WET PREP FOR TRICH, YEAST, CLUE
Trichomonas Exam: NEGATIVE
Yeast Exam: NEGATIVE

## 2019-06-28 NOTE — Progress Notes (Signed)
[facility] Department STI clinic/screening visit  Subjective:  Katelyn Mendoza is a 29 y.o. female being seen today for an STI screening visit. The patient reports they do have symptoms.  Patient reports that they do not desire a pregnancy in the next year.   They reported they are not interested in discussing contraception today.  No LMP recorded. (Menstrual status: Oral contraceptives).   Patient has the following medical conditions:   Patient Active Problem List   Diagnosis Date Noted  . Status post laparoscopic cholecystectomy 06/22/2019  . Elevated blood pressure affecting pregnancy in third trimester, antepartum 12/13/2018  . Gestational hypertension 12/13/2018  . Positive GBS test 12/07/2018  . Pregnancy affected by fetal growth restriction 11/07/2018  . Threatened preterm labor 11/02/2018  . Indication for care in labor and delivery, antepartum 09/20/2018  . Pregnancy 09/20/2018  . Chlamydia trachomatis infection in mother during second trimester of pregnancy 09/06/2018  . Family history of Down syndrome   . Sickle cell trait in mother affecting pregnancy ([place])   . BMI 30.0-30.9,adult 07/08/2018  . Bipolar I disorder ([place]) 07/04/2018  . Vulvovaginitis due to yeast 07/04/2018  . Twin pregnancy, monochorionic/diamniotic, unspecified trimester 05/27/2018  . Obesity affecting pregnancy 05/27/2018  . Supervision of high-risk pregnancy 05/10/2018  . 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 Nausea and vomiting during pregnancy 05/10/2018    No chief complaint on file.   HPI  Patient reports that she thought she had a yeast infection last week. States her disch was white with few clumps.  She used Monistat 3.  Con't to have discharge no itching or irritation.  Would like to be screened today. She is on OCPS and delivered twin boys in December, 2020 Last HIV test per patient/review of record was 2020 Patient reports last pap was 2020   See flowsheet for further details and  programmatic requirements.    The following portions of the patient's history were reviewed and updated as appropriate: allergies, current medications, past medical history, past social history, past surgical history and problem list.  Objective:  There were no vitals filed for this visit.  Physical Exam Vitals and nursing note reviewed.  Constitutional:      Appearance: Normal appearance.  HENT:     Head: Normocephalic and atraumatic.     Mouth/Throat:     Mouth: Mucous membranes are moist.     Pharynx: Oropharynx is clear. No oropharyngeal exudate or posterior oropharyngeal erythema.  Pulmonary:     Effort: Pulmonary effort is normal.  Abdominal:     General: Abdomen is flat.     Palpations: There is no mass.     Tenderness: There is no abdominal tenderness. There is no rebound.  Genitourinary:    General: Normal vulva.     Exam position: Lithotomy position.     Pubic Area: No rash or pubic lice.      Labia:        Right: No rash or lesion.        Left: No rash or lesion.      Vagina: Vaginal discharge present. No erythema, bleeding or lesions.     Cervix: No cervical motion tenderness, discharge, friability, lesion or erythema.     Uterus: Normal.      Adnexa: Right adnexa normal and left adnexa normal.     Rectum: Normal.     Comments: Thick white discharge. Moderate amount, pH<4.5 Lymphadenopathy:     Head:     Right side of head: No  preauricular or posterior auricular adenopathy.     Left side of head: No preauricular or posterior auricular adenopathy.     Cervical: No cervical adenopathy.     Upper Body:     Right upper body: No supraclavicular or axillary adenopathy.     Left upper body: No supraclavicular or axillary adenopathy.     Lower Body: No right inguinal adenopathy. No left inguinal adenopathy.  Skin:    General: Skin is warm and dry.     Findings: No rash.  Neurological:     Mental Status: She is alert and oriented to person, place, and time.       Assessment and Plan:  Katelyn Mendoza is a 29 y.o. female presenting to the [facility] Department for STI screening  1. Screening examination for STD (sexually transmitted disease)  - HIV [place] [facility] - Syphilis Serology, [place] - WET PREP FOR TRICH, YEAST, CLUE- no treatment indicated - Chlamydia/Gonorrhea [place] [facility] - Gonococcus culture -Co to RTC if symptoms persist. Will have her  First Covid vaccination today.     No follow-ups on file.  No future appointments.  Larene Pickett, FNP

## 2019-06-29 NOTE — Progress Notes (Signed)
Wet mount reviewed with provider, no treatment indicated..Dereke Neumann Brewer-Jensen, RN 

## 2019-07-03 ENCOUNTER — Ambulatory Visit (INDEPENDENT_AMBULATORY_CARE_PROVIDER_SITE_OTHER): Payer: Medicaid Other | Admitting: Obstetrics and Gynecology

## 2019-07-03 ENCOUNTER — Encounter: Payer: Self-pay | Admitting: Obstetrics and Gynecology

## 2019-07-03 ENCOUNTER — Other Ambulatory Visit: Payer: Self-pay

## 2019-07-03 VITALS — BP 100/80 | Ht 61.0 in | Wt 161.0 lb

## 2019-07-03 DIAGNOSIS — N898 Other specified noninflammatory disorders of vagina: Secondary | ICD-10-CM | POA: Diagnosis not present

## 2019-07-03 DIAGNOSIS — N926 Irregular menstruation, unspecified: Secondary | ICD-10-CM | POA: Diagnosis not present

## 2019-07-03 DIAGNOSIS — N760 Acute vaginitis: Secondary | ICD-10-CM | POA: Diagnosis not present

## 2019-07-03 DIAGNOSIS — B9689 Other specified bacterial agents as the cause of diseases classified elsewhere: Secondary | ICD-10-CM

## 2019-07-03 LAB — POCT URINE PREGNANCY: Preg Test, Ur: NEGATIVE

## 2019-07-03 LAB — POCT WET PREP WITH KOH
Clue Cells Wet Prep HPF POC: POSITIVE
KOH Prep POC: POSITIVE — AB
Trichomonas, UA: NEGATIVE
Yeast Wet Prep HPF POC: NEGATIVE

## 2019-07-03 MED ORDER — FLUCONAZOLE 150 MG PO TABS: 150.0000 mg | ORAL_TABLET | Freq: Once | ORAL | 0 refills | Status: AC

## 2019-07-03 MED ORDER — METRONIDAZOLE 500 MG PO TABS: 500.0000 mg | ORAL_TABLET | Freq: Two times a day (BID) | ORAL | 0 refills | Status: AC

## 2019-07-03 NOTE — Patient Instructions (Signed)
I value your feedback and entrusting us with your care. If you get a [facility] patient survey, I would appreciate you taking the time to let us know about your experience today. Thank you!  As of December 22, 2018, your lab results will be released to your MyChart immediately, before I even have a chance to see them. Please give me time to review them and contact you if there are any abnormalities. Thank you for your patience.  

## 2019-07-03 NOTE — Progress Notes (Signed)
[employer], manufacturing Complaint  Patient presents with  . Vaginal Discharge    itching and irritation, no odor on/off x 2 weeks, pt would like UPT    HPI:      Katelyn Mendoza is a 29 y.o. [phone] whose LMP was No LMP recorded (lmp unknown). (Menstrual status: Oral contraceptives)., presents today for increased vag d/c with irritation, no odor, for 2 wks. Had neg wet prep for yeast, trich and BV 06/28/19 at [facility] but had also treated with monistat -3. Sx first started after surgery 06/07/19. Improved with monistat use but worse again for past wk. Hx of BV in past. Did change detergents recently. No urin sx, LBP, pelvic pain, fevers.  Katelyn Mendoza is sex active, on OCPs. Sometimes misses period with pills. No new partners. Had full STD testing at [facility] 06/28/19--awaiting those results.   Past Medical History:  Diagnosis Date  . Gallstones   . HSV (herpes simplex virus) infection   . Mental disorder    bipolar disorder dx'd at age 18  . Sickle cell trait (HCC)   . Spinal headache   . Urinary tract infection affecting care of mother, antepartum     Past Surgical History:  Procedure Laterality Date  . CHOLECYSTECTOMY    . NO PAST SURGERIES    . spontaneous vaginal deliver     twins 12/20  . WISDOM TOOTH EXTRACTION      Family History  Problem Relation Age of Onset  . Diabetes Mother   . Hypertension Mother   . Asthma Mother   . Asthma Brother   . Colon cancer Maternal Grandfather     Social History   Socioeconomic History  . Marital status: Single    Spouse name: Not on file  . Number of children: Not on file  . Years of education: Not on file  . Highest education level: Not on file  Occupational History  . Not on file  Tobacco Use  . Smoking status: Never Smoker  . Smokeless tobacco: Never Used  Vaping Use  . Vaping Use: Never used  Substance and Sexual Activity  . Alcohol use: Yes    Comment: rarely  . Drug use: No  . Sexual activity: Yes     Birth control/protection: Pill  Other Topics Concern  . Not on file  Social History Narrative  . Not on file   Social Determinants of Health   Financial Resource Strain:   . Difficulty of Paying Living Expenses:   Food Insecurity: Unknown  . Worried About Programme researcher, broadcasting/film/video in the Last Year: Patient refused  . Ran Out of Food in the Last Year: Patient refused  Transportation Needs: No Transportation Needs  . Lack of Transportation (Medical): No  . Lack of Transportation (Non-Medical): No  Physical Activity: Unknown  . Days of Exercise per Week: Patient refused  . Minutes of Exercise per Session: Patient refused  Stress: No Stress Concern Present  . Feeling of Stress : Not at all  Social Connections: Unknown  . Frequency of Communication with Friends and Family: Patient refused  . Frequency of Social Gatherings with Friends and Family: Patient refused  . Attends Religious Services: Patient refused  . Active Member of Clubs or Organizations: Patient refused  . Attends Banker Meetings: Patient refused  . Marital Status: Patient refused  Intimate Partner Violence: Not At Risk  . Fear of Current or Ex-Partner: No  . Emotionally Abused: No  .  Physically Abused: No  . Sexually Abused: No    Outpatient Medications Prior to Visit  Medication Sig Dispense Refill  . drospirenone-ethinyl estradiol (YAZ) 3-0.02 MG tablet TAKE 1 TABLET BY MOUTH DAILY 28 tablet 10  . HYDROcodone-acetaminophen (NORCO/VICODIN) 5-325 MG tablet Take 1 tablet by mouth every 4 (four) hours as needed. (Patient not taking: Reported on 07/03/2019) 15 tablet 0  . ibuprofen (ADVIL) 800 MG tablet Take 1 tablet (800 mg total) by mouth every 8 (eight) hours as needed. (Patient not taking: Reported on 07/03/2019) 30 tablet 0  . valACYclovir (VALTREX) 500 MG tablet Take 500 mg by mouth 2 (two) times daily. (Patient not taking: Reported on 07/03/2019)     No facility-administered medications prior to visit.       ROS:  Review of Systems  Constitutional: Negative for fever.  Gastrointestinal: Negative for blood in stool, constipation, diarrhea, nausea and vomiting.  Genitourinary: Positive for vaginal discharge. Negative for dyspareunia, dysuria, flank pain, frequency, hematuria, urgency, vaginal bleeding and vaginal pain.  Musculoskeletal: Negative for back pain.  Skin: Negative for rash.    OBJECTIVE:   Vitals:  BP 100/80   Ht 5\' 1"  (1.549 m)   Wt 161 lb (73 kg)   LMP  (LMP Unknown)   Breastfeeding No   BMI 30.42 kg/m   Physical Exam Vitals reviewed.  Constitutional:      Appearance: Katelyn Mendoza is well-developed.  Pulmonary:     Effort: Pulmonary effort is normal.  Genitourinary:    Pubic Area: No rash.      Labia:        Right: No rash, tenderness or lesion.        Left: No rash, tenderness or lesion.      Vagina: Vaginal discharge present. No erythema or tenderness.     Cervix: Normal.     Uterus: Normal. Not enlarged and not tender.      Adnexa: Right adnexa normal and left adnexa normal.       Right: No mass or tenderness.         Left: No mass or tenderness.       Comments: ERYTHEMA BILAT LABIA MINORA Musculoskeletal:        General: Normal range of motion.     Cervical back: Normal range of motion.  Skin:    General: Skin is warm and dry.  Neurological:     General: No focal deficit present.     Mental Status: Katelyn Mendoza is alert and oriented to person, place, and time.  Psychiatric:        Mood and Affect: Mood normal.        Behavior: Behavior normal.        Thought Content: Thought content normal.        Judgment: Judgment normal.     Results: Results for orders placed or performed in visit on 07/03/19 (from the past 24 hour(s))  POCT Wet Prep with KOH     Status: Abnormal   Collection Time: 07/03/19  4:07 PM  Result Value Ref Range   Trichomonas, UA Negative    Clue Cells Wet Prep HPF POC pos    Epithelial Wet Prep HPF POC     Yeast Wet Prep HPF POC neg     Bacteria Wet Prep HPF POC     RBC Wet Prep HPF POC     WBC Wet Prep HPF POC     KOH Prep POC Positive (A) Negative  POCT urine pregnancy  Status: Normal   Collection Time: 07/03/19  4:09 PM  Result Value Ref Range   Preg Test, Ur Negative Negative     Assessment/Plan: Bacterial vaginosis - Plan: metroNIDAZOLE (FLAGYL) 500 MG tablet, POCT Wet Prep with KOH; Pos sx/wet prep. Rx flagyl. No EtOH. Will RF if sx recur. F/u prn.   Vaginal itching - Plan: fluconazole (DIFLUCAN) 150 MG tablet; neg wet prep, pos sx and exam. Rx diflucan. F/u prn.   Late menses--with OCPs. Neg UPT. Reassurance. Fu/ prn.   Meds ordered this encounter  Medications  . fluconazole (DIFLUCAN) 150 MG tablet    Sig: Take 1 tablet (150 mg total) by mouth once for 1 dose.    Dispense:  1 tablet    Refill:  0    Order Specific Question:   Supervising Provider    Answer:   Nadara Mustard [id number]  . metroNIDAZOLE (FLAGYL) 500 MG tablet    Sig: Take 1 tablet (500 mg total) by mouth 2 (two) times daily for 7 days.    Dispense:  14 tablet    Refill:  0    Order Specific Question:   Supervising Provider    Answer:   Nadara Mustard [[id number]]      Return if symptoms worsen or fail to improve.  Magdalyn Arenivas B. Kinsie Belford, PA-C 07/03/2019 4:09 PM

## 2019-07-04 LAB — GONOCOCCUS CULTURE

## 2019-07-25 ENCOUNTER — Ambulatory Visit: Payer: Medicaid Other | Attending: Critical Care Medicine

## 2019-07-25 DIAGNOSIS — Z23 Encounter for immunization: Secondary | ICD-10-CM

## 2019-07-25 NOTE — Progress Notes (Signed)
   Covid-19 Vaccination Clinic  Name:  Katelyn Mendoza    MRN: [id number] DOB: 1990-08-09  07/25/2019  Katelyn Mendoza was observed post Covid-19 immunization for 15 minutes without incident. She was provided with Vaccine Information Sheet and instruction to access the V-Safe system.   Katelyn Mendoza was instructed to call 911 with any severe reactions post vaccine: Marland Kitchen Difficulty breathing  . Swelling of face and throat  . A fast heartbeat  . A bad rash all over body  . Dizziness and weakness   Immunizations Administered    Name Date Dose VIS Date Route   Pfizer COVID-19 Vaccine 07/25/2019  9:58 AM 0.3 mL 03/08/2018 Intramuscular   Manufacturer: [employer], [employer]   Lot: [id number]   NDC: [address]-8

## 2019-07-27 ENCOUNTER — Telehealth: Payer: Self-pay

## 2019-07-27 NOTE — Telephone Encounter (Signed)
Called pt to get more details, no answer, LVMTRC.

## 2019-07-27 NOTE — Telephone Encounter (Signed)
Pt called.  Missed call; please call back.

## 2019-07-27 NOTE — Telephone Encounter (Signed)
No recent urine C&S on file. Have pt come by tomorrow with RN for UA, send for C&S. Thx

## 2019-07-27 NOTE — Telephone Encounter (Signed)
Patient returning call.

## 2019-07-27 NOTE — Telephone Encounter (Signed)
Called pt, no answer, LVMTRC. You want more details or is info pt provided enough?

## 2019-07-27 NOTE — Telephone Encounter (Signed)
Pt calling; has UTI - urgency, frequency.  Can something be called in?  [phone]

## 2019-07-28 NOTE — Telephone Encounter (Signed)
Called and left voicemail for patient to call back to be scheduled for nurse visit per [facility]

## 2019-08-08 NOTE — Telephone Encounter (Signed)
Pt has appt 08/09/19 c [facility].

## 2019-08-09 ENCOUNTER — Ambulatory Visit: Payer: Medicaid Other | Admitting: Advanced Practice Midwife

## 2019-08-16 ENCOUNTER — Ambulatory Visit: Payer: Medicaid Other | Admitting: Obstetrics and Gynecology

## 2019-10-05 ENCOUNTER — Ambulatory Visit: Payer: Medicaid Other | Admitting: Obstetrics and Gynecology

## 2019-10-05 NOTE — Progress Notes (Deleted)
[employer]   No chief complaint on file.   HPI:      Katelyn Mendoza is a 29 y.o. [phone] whose LMP was No LMP recorded. (Menstrual status: Oral contraceptives)., presents today for *** Neg gon/chlam 6/21 at [facility] BV and yeast vag 6/21  Past Medical History:  Diagnosis Date  . Gallstones   . HSV (herpes simplex virus) infection   . Mental disorder    bipolar disorder dx'd at age 67  . Sickle cell trait (HCC)   . Spinal headache   . Urinary tract infection affecting care of mother, antepartum     Past Surgical History:  Procedure Laterality Date  . CHOLECYSTECTOMY    . NO PAST SURGERIES    . spontaneous vaginal deliver     twins 12/20  . WISDOM TOOTH EXTRACTION      Family History  Problem Relation Age of Onset  . Diabetes Mother   . Hypertension Mother   . Asthma Mother   . Asthma Brother   . Colon cancer Maternal Grandfather     Social History   Socioeconomic History  . Marital status: Single    Spouse name: Not on file  . Number of children: Not on file  . Years of education: Not on file  . Highest education level: Not on file  Occupational History  . Not on file  Tobacco Use  . Smoking status: Never Smoker  . Smokeless tobacco: Never Used  Vaping Use  . Vaping Use: Never used  Substance and Sexual Activity  . Alcohol use: Yes    Comment: rarely  . Drug use: No  . Sexual activity: Yes    Birth control/protection: Pill  Other Topics Concern  . Not on file  Social History Narrative  . Not on file   Social Determinants of Health   Financial Resource Strain:   . Difficulty of Paying Living Expenses: Not on file  Food Insecurity: Unknown  . Worried About Programme researcher, broadcasting/film/video in the Last Year: Patient refused  . Ran Out of Food in the Last Year: Patient refused  Transportation Needs: No Transportation Needs  . Lack of Transportation (Medical): No  . Lack of Transportation (Non-Medical): No  Physical Activity: Unknown    . Days of Exercise per Week: Patient refused  . Minutes of Exercise per Session: Patient refused  Stress: No Stress Concern Present  . Feeling of Stress : Not at all  Social Connections: Unknown  . Frequency of Communication with Friends and Family: Patient refused  . Frequency of Social Gatherings with Friends and Family: Patient refused  . Attends Religious Services: Patient refused  . Active Member of Clubs or Organizations: Patient refused  . Attends Banker Meetings: Patient refused  . Marital Status: Patient refused  Intimate Partner Violence: Not At Risk  . Fear of Current or Ex-Partner: No  . Emotionally Abused: No  . Physically Abused: No  . Sexually Abused: No    Outpatient Medications Prior to Visit  Medication Sig Dispense Refill  . drospirenone-ethinyl estradiol (YAZ) 3-0.02 MG tablet TAKE 1 TABLET BY MOUTH DAILY 28 tablet 10  . HYDROcodone-acetaminophen (NORCO/VICODIN) 5-325 MG tablet Take 1 tablet by mouth every 4 (four) hours as needed. (Patient not taking: Reported on 07/03/2019) 15 tablet 0  . ibuprofen (ADVIL) 800 MG tablet Take 1 tablet (800 mg total) by mouth every 8 (eight) hours as needed. (Patient not taking: Reported on 07/03/2019) 30 tablet  0  . valACYclovir (VALTREX) 500 MG tablet Take 500 mg by mouth 2 (two) times daily. (Patient not taking: Reported on 07/03/2019)     No facility-administered medications prior to visit.      ROS:  Review of Systems BREAST: No symptoms   OBJECTIVE:   Vitals:  There were no vitals taken for this visit.  Physical Exam  Results: No results found for this or any previous visit (from the past 24 hour(s)).   Assessment/Plan: No diagnosis found.    No orders of the defined types were placed in this encounter.     No follow-ups on file.  Zaidy Absher B. Miko Markwood, PA-C 10/05/2019 11:06 AM

## 2019-10-10 ENCOUNTER — Encounter: Payer: Self-pay | Admitting: Obstetrics and Gynecology

## 2019-10-10 ENCOUNTER — Other Ambulatory Visit: Payer: Self-pay

## 2019-10-10 ENCOUNTER — Ambulatory Visit (INDEPENDENT_AMBULATORY_CARE_PROVIDER_SITE_OTHER): Payer: Medicaid Other | Admitting: Obstetrics and Gynecology

## 2019-10-10 VITALS — BP 100/60 | Ht 61.0 in | Wt 156.0 lb

## 2019-10-10 DIAGNOSIS — Z113 Encounter for screening for infections with a predominantly sexual mode of transmission: Secondary | ICD-10-CM | POA: Diagnosis not present

## 2019-10-10 DIAGNOSIS — N898 Other specified noninflammatory disorders of vagina: Secondary | ICD-10-CM | POA: Diagnosis not present

## 2019-10-10 LAB — POCT WET PREP WITH KOH
Clue Cells Wet Prep HPF POC: NEGATIVE
KOH Prep POC: NEGATIVE
Trichomonas, UA: NEGATIVE
Yeast Wet Prep HPF POC: NEGATIVE

## 2019-10-10 MED ORDER — FLUCONAZOLE 150 MG PO TABS: 150.0000 mg | ORAL_TABLET | Freq: Once | ORAL | 0 refills | Status: AC

## 2019-10-10 NOTE — Progress Notes (Signed)
[employer], Engineer, manufacturing Complaint  Patient presents with  . STD testing    HPI:      Katelyn Mendoza is a 29 y.o. [phone] whose LMP was No LMP recorded. (Menstrual status: Oral contraceptives)., presents today for STD testing. Has had increased vag d/c with irritation, no fishy odor for the past wk. No meds to treat. Hx of BV and yeast vag 6/21. Pt is sex active, no new partners. No urin sx, no LBP, pelvic pain, fevers.  Neg gon/chlam/RPR/HIV 6/21 at [facility]   Past Medical History:  Diagnosis Date  . Gallstones   . HSV (herpes simplex virus) infection   . Mental disorder    bipolar disorder dx'd at age 77  . Sickle cell trait (HCC)   . Spinal headache   . Urinary tract infection affecting care of mother, antepartum     Past Surgical History:  Procedure Laterality Date  . CHOLECYSTECTOMY    . NO PAST SURGERIES    . spontaneous vaginal deliver     twins 12/20  . WISDOM TOOTH EXTRACTION      Family History  Problem Relation Age of Onset  . Diabetes Mother   . Hypertension Mother   . Asthma Mother   . Asthma Brother   . Colon cancer Maternal Grandfather     Social History   Socioeconomic History  . Marital status: Single    Spouse name: Not on file  . Number of children: Not on file  . Years of education: Not on file  . Highest education level: Not on file  Occupational History  . Not on file  Tobacco Use  . Smoking status: Never Smoker  . Smokeless tobacco: Never Used  Vaping Use  . Vaping Use: Never used  Substance and Sexual Activity  . Alcohol use: Yes    Comment: rarely  . Drug use: No  . Sexual activity: Yes    Birth control/protection: Pill  Other Topics Concern  . Not on file  Social History Narrative  . Not on file   Social Determinants of Health   Financial Resource Strain:   . Difficulty of Paying Living Expenses: Not on file  Food Insecurity: Unknown  . Worried About Programme researcher, broadcasting/film/video in the Last Year: Patient  refused  . Ran Out of Food in the Last Year: Patient refused  Transportation Needs: No Transportation Needs  . Lack of Transportation (Medical): No  . Lack of Transportation (Non-Medical): No  Physical Activity: Unknown  . Days of Exercise per Week: Patient refused  . Minutes of Exercise per Session: Patient refused  Stress: No Stress Concern Present  . Feeling of Stress : Not at all  Social Connections: Unknown  . Frequency of Communication with Friends and Family: Patient refused  . Frequency of Social Gatherings with Friends and Family: Patient refused  . Attends Religious Services: Patient refused  . Active Member of Clubs or Organizations: Patient refused  . Attends Banker Meetings: Patient refused  . Marital Status: Patient refused  Intimate Partner Violence: Not At Risk  . Fear of Current or Ex-Partner: No  . Emotionally Abused: No  . Physically Abused: No  . Sexually Abused: No    Outpatient Medications Prior to Visit  Medication Sig Dispense Refill  . drospirenone-ethinyl estradiol (YAZ) 3-0.02 MG tablet TAKE 1 TABLET BY MOUTH DAILY 28 tablet 10  . valACYclovir (VALTREX) 500 MG tablet Take 500 mg by mouth 2 (  two) times daily.     Marland Kitchen HYDROcodone-acetaminophen (NORCO/VICODIN) 5-325 MG tablet Take 1 tablet by mouth every 4 (four) hours as needed. (Patient not taking: Reported on 07/03/2019) 15 tablet 0  . ibuprofen (ADVIL) 800 MG tablet Take 1 tablet (800 mg total) by mouth every 8 (eight) hours as needed. (Patient not taking: Reported on 07/03/2019) 30 tablet 0   No facility-administered medications prior to visit.      ROS:  Review of Systems  Constitutional: Negative for fever.  Gastrointestinal: Negative for blood in stool, constipation, diarrhea, nausea and vomiting.  Genitourinary: Positive for vaginal discharge. Negative for dyspareunia, dysuria, flank pain, frequency, hematuria, urgency, vaginal bleeding and vaginal pain.  Musculoskeletal: Negative  for back pain.  Skin: Negative for rash.   BREAST: No symptoms   OBJECTIVE:   Vitals:  BP 100/60   Ht 5\' 1"  (1.549 m)   Wt 156 lb (70.8 kg)   Breastfeeding No   BMI 29.48 kg/m   Physical Exam Vitals reviewed.  Constitutional:      Appearance: She is well-developed.  Pulmonary:     Effort: Pulmonary effort is normal.  Genitourinary:    General: Normal vulva.     Pubic Area: No rash.      Labia:        Right: No rash, tenderness or lesion.        Left: No rash, tenderness or lesion.      Vagina: Vaginal discharge present. No erythema or tenderness.     Cervix: Normal.     Uterus: Normal. Not enlarged and not tender.      Adnexa: Right adnexa normal and left adnexa normal.       Right: No mass or tenderness.         Left: No mass or tenderness.       Comments: SLIGHT ERYTHEMA AT VAG OPENING Musculoskeletal:        General: Normal range of motion.     Cervical back: Normal range of motion.  Skin:    General: Skin is warm and dry.  Neurological:     General: No focal deficit present.     Mental Status: She is alert and oriented to person, place, and time.  Psychiatric:        Mood and Affect: Mood normal.        Behavior: Behavior normal.        Thought Content: Thought content normal.        Judgment: Judgment normal.     Results: Results for orders placed or performed in visit on 10/10/19 (from the past 24 hour(s))  POCT Wet Prep with KOH     Status: Normal   Collection Time: 10/10/19 11:14 AM  Result Value Ref Range   Trichomonas, UA Negative    Clue Cells Wet Prep HPF POC neg    Epithelial Wet Prep HPF POC     Yeast Wet Prep HPF POC neg    Bacteria Wet Prep HPF POC     RBC Wet Prep HPF POC     WBC Wet Prep HPF POC     KOH Prep POC Negative Negative     Assessment/Plan: Vaginal discharge - Plan: fluconazole (DIFLUCAN) 150 MG tablet, POCT Wet Prep with KOH, NuSwab Vaginitis Plus (VG+); Neg wet prep, pos sx and exam. Rx diflucan. Check nuswab. Will f/u  with results if pos. F/u prn.   Screening for STD (sexually transmitted disease) - Plan: NuSwab Vaginitis Plus (VG+)  Meds ordered this encounter  Medications  . fluconazole (DIFLUCAN) 150 MG tablet    Sig: Take 1 tablet (150 mg total) by mouth once for 1 dose.    Dispense:  1 tablet    Refill:  0    Order Specific Question:   Supervising Provider    Answer:   Nadara Mustard [[id number]]      Return if symptoms worsen or fail to improve.  Javon Snee B. Mayre Bury, PA-C 10/10/2019 11:15 AM

## 2019-10-10 NOTE — Patient Instructions (Signed)
I value your feedback and entrusting us with your care. If you get a [facility] patient survey, I would appreciate you taking the time to let us know about your experience today. Thank you!  As of December 22, 2018, your lab results will be released to your MyChart immediately, before I even have a chance to see them. Please give me time to review them and contact you if there are any abnormalities. Thank you for your patience.  

## 2019-10-12 ENCOUNTER — Telehealth: Payer: Self-pay

## 2019-10-12 NOTE — Telephone Encounter (Signed)
Pt calling; was seen earlier this week; wants results; okay to leave msg.  [phone]  Adv pt results are not back yet.  She asked if it will be on MyChart.  Adv is should.

## 2019-10-13 LAB — NUSWAB VAGINITIS PLUS (VG+)
Candida albicans, NAA: NEGATIVE
Candida glabrata, NAA: NEGATIVE
Chlamydia trachomatis, NAA: NEGATIVE
Neisseria gonorrhoeae, NAA: NEGATIVE
Trich vag by NAA: NEGATIVE

## 2019-10-13 NOTE — Telephone Encounter (Signed)
Pt calling for results.  [phone]  Pt aware not back yet.  Will have LC tech investigate and get back to her.

## 2019-11-28 ENCOUNTER — Other Ambulatory Visit: Payer: Self-pay

## 2019-11-28 MED ORDER — VALACYCLOVIR HCL 500 MG PO TABS: 500.0000 mg | ORAL_TABLET | Freq: Two times a day (BID) | ORAL | 0 refills | Status: DC

## 2019-11-28 NOTE — Telephone Encounter (Signed)
Pt needs refill on her valtrex

## 2019-11-29 ENCOUNTER — Telehealth: Payer: Self-pay

## 2019-11-29 NOTE — Telephone Encounter (Signed)
Pt calling for refill of cyclovir to be sent to [facility] in [place].  [phone]  Pt aware refill eRx'd 11/28/19 9:45am

## 2019-12-22 ENCOUNTER — Ambulatory Visit: Payer: Medicaid Other | Admitting: Obstetrics

## 2019-12-31 ENCOUNTER — Other Ambulatory Visit: Payer: Self-pay | Admitting: Surgery

## 2020-01-02 ENCOUNTER — Other Ambulatory Visit: Payer: Self-pay

## 2020-01-02 ENCOUNTER — Other Ambulatory Visit (HOSPITAL_COMMUNITY)
Admission: RE | Admit: 2020-01-02 | Discharge: 2020-01-02 | Disposition: A | Discharge status: Home / Self Care | Payer: Medicaid Other | Source: Ambulatory Visit | Attending: Obstetrics | Admitting: Obstetrics

## 2020-01-02 ENCOUNTER — Ambulatory Visit (INDEPENDENT_AMBULATORY_CARE_PROVIDER_SITE_OTHER): Payer: Medicaid Other | Admitting: Obstetrics

## 2020-01-02 ENCOUNTER — Encounter: Payer: Self-pay | Admitting: Obstetrics

## 2020-01-02 VITALS — BP 100/60 | HR 78 | Temp 99.5°F | Ht 61.0 in | Wt 156.0 lb

## 2020-01-02 DIAGNOSIS — R110 Nausea: Secondary | ICD-10-CM

## 2020-01-02 DIAGNOSIS — Z308 Encounter for other contraceptive management: Secondary | ICD-10-CM | POA: Diagnosis not present

## 2020-01-02 DIAGNOSIS — Z113 Encounter for screening for infections with a predominantly sexual mode of transmission: Secondary | ICD-10-CM | POA: Diagnosis not present

## 2020-01-02 DIAGNOSIS — Z01419 Encounter for gynecological examination (general) (routine) without abnormal findings: Secondary | ICD-10-CM | POA: Insufficient documentation

## 2020-01-02 DIAGNOSIS — B009 Herpesviral infection, unspecified: Secondary | ICD-10-CM

## 2020-01-02 DIAGNOSIS — N926 Irregular menstruation, unspecified: Secondary | ICD-10-CM

## 2020-01-02 LAB — POCT URINE PREGNANCY: Preg Test, Ur: NEGATIVE

## 2020-01-02 MED ORDER — VALACYCLOVIR HCL 500 MG PO TABS: 500.0000 mg | ORAL_TABLET | Freq: Two times a day (BID) | ORAL | 2 refills | Status: DC

## 2020-01-02 MED ORDER — NORGESTIMATE-ETH ESTRADIOL 0.25-35 MG-MCG PO TABS: 1.0000 | ORAL_TABLET | Freq: Every day | ORAL | 3 refills | Status: DC

## 2020-01-02 NOTE — Progress Notes (Signed)
Gynecology Annual Exam  PCP: [facility]  Chief Complaint:  Chief Complaint  Patient presents with  . Annual Exam    C/o upper abd pain; nauesa; not feeling well; wants preg test. Needs refills    History of Present Illness: Katelyn Mendoza is a 29 y.o. [phone] presents for annual exam. The patient complains of some ongoing intermittant problems with abdominal pain, which she wants to address with a physician.  She wants her annual Well woman Gyn PE and renew her OCPs. She is also requesting STI screening.  Her menses are regular, they occur every month, and they last she is here for a Well Woman Gyn Pe and to renew OCPs days. Her flow is irregular and moderate. She does have intermenstrual bleeding. Her last menstrual period was unknown..she forgets to take her OCPs from time to time but has tried Depo, the Nexplanon, and was not happy with either. She prefers OCPs. She denies dysmenorrhea. Last pap smear: 2020, results were NILM   The patient is  sexually active. She currently uses nothing for contraception. She does not have dyspareunia.  Since her last visit, she has had no significant changes in her health.  Her past medical history is remarkable for gallbladder surgery this year. She has a hx of HSV and uses Valtrex  The patient does perform self breast exams. Her last mammogram was NA, results were NA.   There is no family history of breast cancer. Genetic testing has not been done.   There is no family history of ovarian cancer. Genetic testing has not been done.  The patient denies smoking.  She reports drinking alcohol. She reports have drinks rarely adn socially drinks per week.   She denies illegal drug use.  The patient reports exercising regularly.  The patient denies current symptoms of depression.    Review of Systems: Review of Systems  Constitutional: Negative.   HENT: Negative.   Eyes: Negative.   Respiratory: Negative.    Cardiovascular: Negative.   Gastrointestinal: Positive for abdominal pain, nausea and vomiting.  Genitourinary: Negative.   Musculoskeletal: Negative.   Skin: Negative.   Neurological: Positive for dizziness.  Endo/Heme/Allergies: Negative.   Psychiatric/Behavioral: Negative.     Past Medical History:  Past Medical History:  Diagnosis Date  . Gallstones   . HSV (herpes simplex virus) infection   . Mental disorder    bipolar disorder dx'd at age 45  . Sickle cell trait (HCC)   . Spinal headache   . Urinary tract infection affecting care of mother, antepartum     Past Surgical History:  Past Surgical History:  Procedure Laterality Date  . CHOLECYSTECTOMY  05/2019  . NO PAST SURGERIES    . spontaneous vaginal deliver     twins 12/20  . WISDOM TOOTH EXTRACTION      Family History:  Family History  Problem Relation Age of Onset  . Diabetes Mother   . Hypertension Mother   . Asthma Mother   . Asthma Brother   . Colon cancer Maternal Grandfather     Social History:  Social History   Socioeconomic History  . Marital status: Single    Spouse name: Not on file  . Number of children: Not on file  . Years of education: Not on file  . Highest education level: Not on file  Occupational History  . Not on file  Tobacco Use  . Smoking status: Never Smoker  . Smokeless tobacco:  Never Used  Vaping Use  . Vaping Use: Never used  Substance and Sexual Activity  . Alcohol use: Yes    Comment: rarely  . Drug use: No  . Sexual activity: Yes    Birth control/protection: Pill  Other Topics Concern  . Not on file  Social History Narrative  . Not on file   Social Determinants of Health   Financial Resource Strain: Not on file  Food Insecurity: Not on file  Transportation Needs: Not on file  Physical Activity: Not on file  Stress: Not on file  Social Connections: Not on file  Intimate Partner Violence: Not on file    Allergies:  No Known  Allergies  Medications: Prior to Admission medications   Medication Sig Start Date End Date Taking? Authorizing Provider  drospirenone-ethinyl estradiol (YAZ) 3-0.02 MG tablet TAKE 1 TABLET BY MOUTH DAILY 03/22/19  Yes Nadara Mustard, MD  valACYclovir (VALTREX) 500 MG tablet Take 1 tablet (500 mg total) by mouth 2 (two) times daily. 11/28/19  Yes Vena Austria, MD    Physical Exam Vitals: Blood pressure 100/60, pulse 78, temperature 99.5 F (37.5 C), height 5\' 1"  (1.549 m), weight 156 lb (70.8 kg), not currently breastfeeding.  General: NAD HEENT: normocephalic, anicteric Neck: no thyroid enlargement, no palpable nodules, no cervical lymphadenopathy  Pulmonary: No increased work of breathing, CTAB Cardiovascular: RRR, without murmur  Breast: Breast symmetrical, no tenderness, no palpable nodules or masses, no skin or nipple retraction present, no nipple discharge.  No axillary, infraclavicular or supraclavicular lymphadenopathy. Abdomen: Soft, non-tender, non-distended. Slight discomfort with deeper palpation. Umbilicus without lesions.  No hepatomegaly or masses palpable. No evidence of hernia. Genitourinary:  External: Normal external female genitalia.  Normal urethral meatus, normal  Bartholin's and Skene's glands.    Vagina: Normal vaginal mucosa, no evidence of prolapse.    Cervix: Grossly normal in appearance, no bleeding, non-tender, scant white discharge  Uterus: Anteverted, normal size, shape, and consistency, mobile, and non-tender  Adnexa: No adnexal masses, non-tender  Rectal: deferred  Lymphatic: no evidence of inguinal lymphadenopathy Extremities: no edema, erythema, or tenderness Neurologic: Grossly intact Psychiatric: mood appropriate, affect full     Assessment: 29 y.o. 37  For Annual physical Desire STI screening Cancer screening by pap smear.    Plan:  Will order STI blood work (HIV, HEP B, RPR) and send GC/CT cultures.  1) Breast cancer  screening - recommend monthly self breast exam.  2) STI screening was offered and accepted.  3) Cervical cancer screening - Pap was done. ASCCP guidelines and rational discussed.  Patient opts for yearly screening interval  4) Contraception - Education given regarding options for contraception  5) Routine healthcare maintenance including cholesterol and diabetes screening ordered today  Regarding her chronic abdomina issues and recent gallbladder surgery this Summer, she is encouraged to contact her PCP for follow up.Will notify her of lab results as they come in. Her Rx for Valtrex is renewed today. Her OCPs are also renewed.  01-29-1969, CNM  01/02/2020 5:11 PM

## 2020-01-03 ENCOUNTER — Encounter: Payer: Self-pay | Admitting: Obstetrics

## 2020-01-03 LAB — HEP, RPR, HIV PANEL
HIV Screen 4th Generation wRfx: NONREACTIVE
Hepatitis B Surface Ag: NEGATIVE
RPR Ser Ql: NONREACTIVE

## 2020-01-10 ENCOUNTER — Other Ambulatory Visit: Payer: Self-pay | Admitting: Obstetrics

## 2020-01-10 DIAGNOSIS — A599 Trichomoniasis, unspecified: Secondary | ICD-10-CM

## 2020-01-10 LAB — CYTOLOGY - PAP
Chlamydia: NEGATIVE
Comment: NEGATIVE
Comment: NEGATIVE
Comment: NORMAL
Diagnosis: NEGATIVE
Diagnosis: REACTIVE
Neisseria Gonorrhea: NEGATIVE
Trichomonas: POSITIVE — AB

## 2020-01-10 MED ORDER — METRONIDAZOLE 500 MG PO TABS: 500.0000 mg | ORAL_TABLET | Freq: Two times a day (BID) | ORAL | 0 refills | Status: AC

## 2020-01-10 NOTE — Progress Notes (Signed)
Patient recently seen for annual well Woman Physical and had STI testing. She has tested positive for trichomonas, and I have ordered her flagyl 500 mg BID for 7 days. Unable to reach her at her listed phone number . A note has been sent to the office for the triage staff to try contacting her in the morning.Mirna Mires, CNM  01/10/2020 8:56 PM

## 2020-01-11 ENCOUNTER — Telehealth: Payer: Self-pay

## 2020-01-11 NOTE — Telephone Encounter (Signed)
Pt aware of results, rx, partner to be tested and tx'd, and for them not to have sex until they are both cleared.

## 2020-01-11 NOTE — Telephone Encounter (Signed)
-----   Message from Loran Senters, [place] sent at 01/11/2020  8:16 AM EST ----- [facility]

## 2020-01-22 ENCOUNTER — Ambulatory Visit: Payer: Medicaid Other | Admitting: Obstetrics and Gynecology

## 2020-01-23 ENCOUNTER — Other Ambulatory Visit: Payer: Self-pay

## 2020-01-23 ENCOUNTER — Other Ambulatory Visit (HOSPITAL_COMMUNITY)
Admission: RE | Admit: 2020-01-23 | Discharge: 2020-01-23 | Disposition: A | Discharge status: Home / Self Care | Payer: Medicaid Other | Source: Ambulatory Visit | Attending: Advanced Practice Midwife | Admitting: Advanced Practice Midwife

## 2020-01-23 ENCOUNTER — Encounter: Payer: Self-pay | Admitting: Advanced Practice Midwife

## 2020-01-23 ENCOUNTER — Ambulatory Visit (INDEPENDENT_AMBULATORY_CARE_PROVIDER_SITE_OTHER): Payer: Medicaid Other | Admitting: Advanced Practice Midwife

## 2020-01-23 VITALS — BP 118/78 | Ht 61.0 in | Wt 153.0 lb

## 2020-01-23 DIAGNOSIS — Z113 Encounter for screening for infections with a predominantly sexual mode of transmission: Secondary | ICD-10-CM | POA: Insufficient documentation

## 2020-01-23 DIAGNOSIS — A599 Trichomoniasis, unspecified: Secondary | ICD-10-CM

## 2020-01-23 MED ORDER — METRONIDAZOLE 500 MG PO TABS: 2000.0000 mg | ORAL_TABLET | Freq: Once | ORAL | 0 refills | Status: AC

## 2020-01-23 NOTE — Progress Notes (Signed)
Patient ID: Katelyn Mendoza, female   DOB: [birth year], 30 y.o.   MRN: [id number]  Reason for Consult: Follow-up (Std testing - RM 4)    Subjective:  HPI:  Katelyn Mendoza is a 30 y.o. female being seen for follow up STD testing. She was diagnosed with Trichomonas at her visit in December. She ended up throwing up 5 of the 14 prescribed pills. She reports mild irritation currently as her only symptom. She requests repeat testing and another prescription. She reports that her partner was treated. She has no other concerns today.  Past Medical History:  Diagnosis Date  . Gallstones   . HSV (herpes simplex virus) infection   . Mental disorder    bipolar disorder dx'd at age 26  . Sickle cell trait (HCC)   . Spinal headache   . Urinary tract infection affecting care of mother, antepartum    Family History  Problem Relation Age of Onset  . Diabetes Mother   . Hypertension Mother   . Asthma Mother   . Asthma Brother   . Colon cancer Maternal Grandfather    Past Surgical History:  Procedure Laterality Date  . CHOLECYSTECTOMY  05/2019  . NO PAST SURGERIES    . spontaneous vaginal deliver     twins 12/20  . WISDOM TOOTH EXTRACTION      Short Social History:  Social History   Tobacco Use  . Smoking status: Never Smoker  . Smokeless tobacco: Never Used  Substance Use Topics  . Alcohol use: Yes    Comment: rarely    No Known Allergies  Current Outpatient Medications  Medication Sig Dispense Refill  . metroNIDAZOLE (FLAGYL) 500 MG tablet Take 4 tablets (2,000 mg total) by mouth once for 1 dose. 4 tablet 0  . norgestimate-ethinyl estradiol (ORTHO-CYCLEN) 0.25-35 MG-MCG tablet Take 1 tablet by mouth daily. 84 tablet 3   No current facility-administered medications for this visit.    Review of Systems  Constitutional: Negative for chills and fever.  HENT: Negative for congestion, ear discharge, ear pain, hearing loss, sinus pain and sore throat.   Eyes: Negative for  blurred vision and double vision.  Respiratory: Negative for cough, shortness of breath and wheezing.   Cardiovascular: Negative for chest pain, palpitations and leg swelling.  Gastrointestinal: Negative for abdominal pain, blood in stool, constipation, diarrhea, heartburn, melena, nausea and vomiting.  Genitourinary: Negative for dysuria, flank pain, frequency, hematuria and urgency.       Positive for mild vaginal irritation  Musculoskeletal: Negative for back pain, joint pain and myalgias.  Skin: Negative for itching and rash.  Neurological: Negative for dizziness, tingling, tremors, sensory change, speech change, focal weakness, seizures, loss of consciousness, weakness and headaches.  Endo/Heme/Allergies: Negative for environmental allergies. Does not bruise/bleed easily.  Psychiatric/Behavioral: Negative for depression, hallucinations, memory loss, substance abuse and suicidal ideas. The patient is not nervous/anxious and does not have insomnia.         Objective:  Objective   Vitals:   01/23/20 1602  BP: 118/78  Weight: 153 lb (69.4 kg)  Height: 5\' 1"  (1.549 m)   Body mass index is 28.91 kg/m. Constitutional: Well nourished, well developed female in no acute distress.  HEENT: normal Skin: Warm and dry.  Respiratory: Clear to auscultation bilateral. Normal respiratory effort Neuro: DTRs 2+, Cranial nerves grossly intact Psych: Alert and Oriented x3. No memory deficits. Normal mood and affect.  MS: normal gait, normal bilateral lower extremity ROM/strength/stability.  Pelvic exam:  is not limited by body habitus EGBUS: within normal limits Vagina: within normal limits and with normal mucosa, brown discharge on aptima swab Cervix: not evaluated   Assessment/Plan:     30 y.o. G3 P50 female follow up STD testing and re-treatment of previously diagnosed Trichomonal infection due to vomiting of medicine  Aptima- STD swab Rx Metronidazole 2 grams x 1 dose   [name] CNM [facility] 01/23/2020, 4:47 PM

## 2020-01-25 ENCOUNTER — Telehealth: Payer: Self-pay

## 2020-01-25 LAB — CERVICOVAGINAL ANCILLARY ONLY
Chlamydia: NEGATIVE
Comment: NEGATIVE
Comment: NEGATIVE
Comment: NORMAL
Neisseria Gonorrhea: NEGATIVE
Trichomonas: NEGATIVE

## 2020-01-25 NOTE — Telephone Encounter (Signed)
Patient unable to get into my chart. Inquiring about lab results. Cb#[phone]

## 2020-01-25 NOTE — Telephone Encounter (Signed)
Patient advised results of 01/23/19 Aptima neg/normal. Assited with regaining access to my chart. Password reset. Username given.

## 2020-02-06 ENCOUNTER — Other Ambulatory Visit: Payer: Self-pay

## 2020-02-06 ENCOUNTER — Ambulatory Visit: Payer: Medicaid Other | Admitting: Advanced Practice Midwife

## 2020-02-06 ENCOUNTER — Encounter: Payer: Self-pay | Admitting: Advanced Practice Midwife

## 2020-02-06 DIAGNOSIS — B379 Candidiasis, unspecified: Secondary | ICD-10-CM

## 2020-02-06 DIAGNOSIS — Z113 Encounter for screening for infections with a predominantly sexual mode of transmission: Secondary | ICD-10-CM | POA: Diagnosis not present

## 2020-02-06 LAB — WET PREP FOR TRICH, YEAST, CLUE: Trichomonas Exam: NEGATIVE

## 2020-02-06 MED ORDER — CLOTRIMAZOLE 1 % VA CREA: 1.0000 | TOPICAL_CREAM | Freq: Every day | VAGINAL | 0 refills | Status: DC

## 2020-02-06 NOTE — Progress Notes (Signed)
Declined Covid vaccjne booster today. Jossie Ng, RN  Wet mount reviewed and treated for yeast per standing order. Jossie Ng, RN

## 2020-02-06 NOTE — Progress Notes (Signed)
[facility] Department STI clinic/screening visit  Subjective:  Katelyn Mendoza is a 30 y.o. SBF G3P3 nonsmoker female being seen today for an STI screening visit. The patient reports they do have symptoms.  Patient reports that they do not desire a pregnancy in the next year.   They reported they are not interested in discussing contraception today.  Patient's last menstrual period was 01/30/2020 (within days).   Patient has the following medical conditions:   Patient Active Problem List   Diagnosis Date Noted  . Status post laparoscopic cholecystectomy 06/22/2019  . Gestational hypertension 12/13/2018  . Chlamydia trachomatis infection in mother during second trimester of pregnancy 09/06/2018  . BMI 30.0-30.9,adult 07/08/2018  . Bipolar I disorder (HCC) 07/04/2018  . Vulvovaginitis due to yeast 07/04/2018  . 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 Nausea and vomiting during pregnancy 05/10/2018    Chief Complaint  Patient presents with  . SEXUALLY TRANSMITTED DISEASE    HPI  Patient reports internal vaginal irritation, increased white d/c past few days.  LMP 02/06/20.  Last sex 01/26/20 with condom; with current partner x 5 years.  Last MJ 12/2019.  Last ETOH last night (1 glass wine) 1x/wk.  Last HIV test per patient/review of record was 06/28/19 Patient reports last pap was 01/02/20  See flowsheet for further details and programmatic requirements.    The following portions of the patient's history were reviewed and updated as appropriate: allergies, current medications, past medical history, past social history, past surgical history and problem list.  Objective:  There were no vitals filed for this visit.  Physical Exam Vitals and nursing note reviewed.  Constitutional:      Appearance: Normal appearance.  HENT:     Head: Normocephalic and atraumatic.     Mouth/Throat:     Mouth: Mucous membranes are moist.     Pharynx: Oropharynx is clear. No oropharyngeal exudate  or posterior oropharyngeal erythema.  Eyes:     Conjunctiva/sclera: Conjunctivae normal.  Pulmonary:     Effort: Pulmonary effort is normal.  Chest:  Breasts:     Right: No axillary adenopathy or supraclavicular adenopathy.     Left: No axillary adenopathy or supraclavicular adenopathy.    Abdominal:     Palpations: Abdomen is soft. There is no mass.     Tenderness: There is no abdominal tenderness. There is no rebound.     Comments: Soft without masses or tenderness, poor tone  Genitourinary:    General: Normal vulva.     Exam position: Lithotomy position.     Pubic Area: No rash or pubic lice.      Labia:        Right: No rash or lesion.        Left: No rash or lesion.      Vagina: Vaginal discharge (light red mucousy leukorrhea, ph>4.5) present. No erythema, bleeding or lesions.     Cervix: Normal.     Uterus: Normal.      Adnexa: Right adnexa normal and left adnexa normal.     Rectum: Normal.  Lymphadenopathy:     Head:     Right side of head: No preauricular or posterior auricular adenopathy.     Left side of head: No preauricular or posterior auricular adenopathy.     Cervical: No cervical adenopathy.     Upper Body:     Right upper body: No supraclavicular or axillary adenopathy.     Left upper body: No supraclavicular or axillary adenopathy.  Lower Body: No right inguinal adenopathy. No left inguinal adenopathy.  Skin:    General: Skin is warm and dry.     Findings: No rash.  Neurological:     Mental Status: She is alert and oriented to person, place, and time.      Assessment and Plan:  Katelyn Mendoza is a 30 y.o. female presenting to the [facility] Department for STI screening  1. Screening examination for venereal disease Treat wet mount per standing orders Immunization nurse consult - Gonococcus culture - Chlamydia/Gonorrhea [facility]     No follow-ups on file.  No future appointments.  Alberteen Spindle, CNM

## 2020-02-11 LAB — GONOCOCCUS CULTURE

## 2020-03-14 ENCOUNTER — Ambulatory Visit: Payer: Medicaid Other

## 2020-03-21 ENCOUNTER — Other Ambulatory Visit: Payer: Self-pay

## 2020-03-21 ENCOUNTER — Ambulatory Visit: Payer: Medicaid Other | Admitting: Family Medicine

## 2020-03-21 ENCOUNTER — Encounter: Payer: Self-pay | Admitting: Family Medicine

## 2020-03-21 DIAGNOSIS — B3731 Acute candidiasis of vulva and vagina: Secondary | ICD-10-CM

## 2020-03-21 DIAGNOSIS — Z113 Encounter for screening for infections with a predominantly sexual mode of transmission: Secondary | ICD-10-CM | POA: Diagnosis not present

## 2020-03-21 DIAGNOSIS — B9689 Other specified bacterial agents as the cause of diseases classified elsewhere: Secondary | ICD-10-CM

## 2020-03-21 DIAGNOSIS — B379 Candidiasis, unspecified: Secondary | ICD-10-CM

## 2020-03-21 DIAGNOSIS — N760 Acute vaginitis: Secondary | ICD-10-CM

## 2020-03-21 DIAGNOSIS — B373 Candidiasis of vulva and vagina: Secondary | ICD-10-CM

## 2020-03-21 LAB — WET PREP FOR TRICH, YEAST, CLUE
Trichomonas Exam: NEGATIVE
Yeast Exam: NEGATIVE

## 2020-03-21 MED ORDER — CLOTRIMAZOLE 1 % VA CREA
1.0000 | TOPICAL_CREAM | Freq: Every day | VAGINAL | 0 refills | Status: AC
Start: 2020-03-21 — End: 2020-03-28

## 2020-03-21 MED ORDER — METRONIDAZOLE 500 MG PO TABS: 500.0000 mg | ORAL_TABLET | Freq: Two times a day (BID) | ORAL | 0 refills | Status: AC

## 2020-03-21 NOTE — Progress Notes (Signed)
[facility] Department STI clinic/screening visit  Subjective:  Katelyn Mendoza is a 30 y.o. female being seen today for an STI screening visit. The patient reports they do have symptoms.  Patient reports that they do not desire a pregnancy in the next year.   They reported they are not interested in discussing contraception today.  No LMP recorded. (Menstrual status: Oral contraceptives).   Patient has the following medical conditions:   Patient Active Problem List   Diagnosis Date Noted  . Status post laparoscopic cholecystectomy 06/22/2019  . Gestational hypertension 12/13/2018  . Chlamydia trachomatis infection in mother during second trimester of pregnancy 09/06/2018  . BMI 30.0-30.9,adult 07/08/2018  . Bipolar I disorder (HCC) 07/04/2018  . Vulvovaginitis due to yeast 07/04/2018  . 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 Nausea and vomiting during pregnancy 05/10/2018    Chief Complaint  Patient presents with  . SEXUALLY TRANSMITTED DISEASE    Screening     HPI  Patient reports having a yeast infection   Last HIV test per patient/review of record was 01/02/2020  Patient reports last pap was 01/02/2020  See flowsheet for further details and programmatic requirements.    The following portions of the patient's history were reviewed and updated as appropriate: allergies, current medications, past medical history, past social history, past surgical history and problem list.  Objective:  There were no vitals filed for this visit.  Physical Exam Vitals and nursing note reviewed.  Constitutional:      Appearance: Normal appearance.  HENT:     Head: Normocephalic and atraumatic.     Mouth/Throat:     Mouth: Mucous membranes are moist.     Pharynx: Oropharynx is clear. No oropharyngeal exudate or posterior oropharyngeal erythema.  Pulmonary:     Effort: Pulmonary effort is normal.  Chest:  Breasts:     Right: No axillary adenopathy or supraclavicular adenopathy.      Left: No axillary adenopathy or supraclavicular adenopathy.    Abdominal:     General: Abdomen is flat.     Palpations: Abdomen is soft. There is no mass.     Tenderness: There is no abdominal tenderness. There is no rebound.  Genitourinary:    General: Normal vulva.     Exam position: Lithotomy position.     Pubic Area: No rash or pubic lice.      Labia:        Right: No rash or lesion.        Left: No rash or lesion.      Vagina: Normal. No vaginal discharge, erythema, bleeding or lesions.     Cervix: No cervical motion tenderness, discharge, friability, lesion or erythema.     Uterus: Normal.      Adnexa: Right adnexa normal and left adnexa normal.     Rectum: Normal.     Comments: External genitalia without, lice, nits, erythema, edema , lesions or inguinal adenopathy. Vagina with normal mucosa and discharge and pH > 4.  Cervix without visual lesions, uterus firm, mobile, non-tender, no masses, CMT adnexal fullness or tenderness.   Musculoskeletal:     Cervical back: Normal range of motion and neck supple.  Lymphadenopathy:     Head:     Right side of head: No preauricular or posterior auricular adenopathy.     Left side of head: No preauricular or posterior auricular adenopathy.     Cervical: No cervical adenopathy.     Upper Body:     Right upper body:  No supraclavicular or axillary adenopathy.     Left upper body: No supraclavicular or axillary adenopathy.     Lower Body: No right inguinal adenopathy. No left inguinal adenopathy.  Skin:    General: Skin is warm and dry.     Findings: No rash.  Neurological:     Mental Status: She is alert and oriented to person, place, and time.  Psychiatric:        Mood and Affect: Mood normal.        Behavior: Behavior normal.      Assessment and Plan:  Katelyn Mendoza is a 30 y.o. female presenting to the [facility] Department for STI screening  1. Screening examination for venereal disease - HBV Antigen/Antibody  [facility] - Chlamydia/Gonorrhea [facility] - WET PREP FOR TRICH, YEAST, CLUE - Syphilis Serology, [place] [facility] - HIV/HCV [place] [facility] - Chlamydia/Gonorrhea  Lab  2. BV (bacterial vaginosis)  - metroNIDAZOLE (FLAGYL) 500 MG tablet; Take 1 tablet (500 mg total) by mouth 2 (two) times daily for 7 days.  Dispense: 14 tablet; Refill: 0  3. Yeast vaginitis Small amount of Yeast assessed in vaginal canal.   - clotrimazole (V-R CLOTRIMAZOLE VAGINAL) 1 % vaginal cream; Place 1 Applicatorful vaginally at bedtime for 7 days.  Dispense: 45 g; Refill: 0  Directed to use internally and externally.   Patient accepted all screenings including oral, vaginal CT/GC and bloodwork for HIV/RPR.  Patient meets criteria for HepB screening? Yes. Ordered? Yes Patient meets criteria for HepC screening? Yes. Ordered? Yes  Wet prep results positive for BV.   Treatment needed for BV and yeast.   Discussed time line for [facility] results and that patient will be called with positive results and encouraged patient to call if she had not heard in 2 weeks.  Counseled to return or seek care for continued or worsening symptoms Recommended condom use with all sex  Patient is currently using OCP to prevent pregnancy.    Return if symptoms worsen or fail to improve, for as needed.  No future appointments.  Wendi Snipes, FNP

## 2020-03-25 NOTE — Progress Notes (Signed)
Chart reviewed by Pharmacist  Suzanne Walker PharmD, Contract Pharmacist at [facility]  

## 2020-04-24 ENCOUNTER — Encounter: Payer: Self-pay | Admitting: Advanced Practice Midwife

## 2020-04-24 ENCOUNTER — Other Ambulatory Visit: Payer: Self-pay

## 2020-04-24 ENCOUNTER — Other Ambulatory Visit (HOSPITAL_COMMUNITY)
Admission: RE | Admit: 2020-04-24 | Discharge: 2020-04-24 | Disposition: A | Discharge status: Home / Self Care | Payer: Medicaid Other | Source: Ambulatory Visit | Attending: Advanced Practice Midwife | Admitting: Advanced Practice Midwife

## 2020-04-24 ENCOUNTER — Ambulatory Visit (INDEPENDENT_AMBULATORY_CARE_PROVIDER_SITE_OTHER): Payer: Medicaid Other | Admitting: Advanced Practice Midwife

## 2020-04-24 VITALS — BP 114/70 | Ht 61.0 in | Wt 153.8 lb

## 2020-04-24 DIAGNOSIS — N898 Other specified noninflammatory disorders of vagina: Secondary | ICD-10-CM | POA: Diagnosis not present

## 2020-04-24 DIAGNOSIS — N926 Irregular menstruation, unspecified: Secondary | ICD-10-CM | POA: Diagnosis not present

## 2020-04-24 LAB — POCT URINE PREGNANCY: Preg Test, Ur: NEGATIVE

## 2020-04-24 NOTE — Progress Notes (Signed)
Patient ID: Katelyn Mendoza, female   DOB: 1990-03-12, 30 y.o.   MRN: [id number]  Reason for Consult: Gynecologic Exam (Vaginal discharge x 2 weeks)    Subjective:  HPI:  Katelyn Mendoza is a 30 y.o. female being seen for concern of vaginal discharge with a slight fishy odor and some mild vaginal itching. She describes the discharge as thin/white. Her symptoms started about 2 weeks ago. She usually uses condoms and recently did not use a condom. She wonders if she is pregnant and requests vaginitis/STD testing.  Past Medical History:  Diagnosis Date  . Gallstones   . HSV (herpes simplex virus) infection   . Mental disorder    bipolar disorder dx'd at age 27  . Sickle cell trait (HCC)   . Spinal headache   . Urinary tract infection affecting care of mother, antepartum    Family History  Problem Relation Age of Onset  . Diabetes Mother   . Hypertension Mother   . Asthma Mother   . Asthma Brother   . Colon cancer Maternal Grandfather    Past Surgical History:  Procedure Laterality Date  . CHOLECYSTECTOMY  05/2019  . NO PAST SURGERIES    . spontaneous vaginal deliver     twins 12/20  . WISDOM TOOTH EXTRACTION      Short Social History:  Social History   Tobacco Use  . Smoking status: Never Smoker  . Smokeless tobacco: Never Used  Substance Use Topics  . Alcohol use: Yes    Alcohol/week: 1.0 standard drink    Types: 1 Glasses of wine per week    Comment: last use 02/05/20    No Known Allergies  Current Outpatient Medications  Medication Sig Dispense Refill  . norgestimate-ethinyl estradiol (ORTHO-CYCLEN) 0.25-35 MG-MCG tablet Take 1 tablet by mouth daily. 84 tablet 3   No current facility-administered medications for this visit.    Review of Systems  Constitutional: Negative for chills and fever.  HENT: Negative for congestion, ear discharge, ear pain, hearing loss, sinus pain and sore throat.   Eyes: Negative for blurred vision and double vision.   Respiratory: Negative for cough, shortness of breath and wheezing.   Cardiovascular: Negative for chest pain, palpitations and leg swelling.  Gastrointestinal: Negative for abdominal pain, blood in stool, constipation, diarrhea, heartburn, melena, nausea and vomiting.  Genitourinary: Negative for dysuria, flank pain, frequency, hematuria and urgency.       Positive for vaginal discharge, odor, itch  Musculoskeletal: Negative for back pain, joint pain and myalgias.  Skin: Negative for itching and rash.  Neurological: Negative for dizziness, tingling, tremors, sensory change, speech change, focal weakness, seizures, loss of consciousness, weakness and headaches.  Endo/Heme/Allergies: Negative for environmental allergies. Does not bruise/bleed easily.  Psychiatric/Behavioral: Negative for depression, hallucinations, memory loss, substance abuse and suicidal ideas. The patient is not nervous/anxious and does not have insomnia.        Objective:  Objective   Vitals:   04/24/20 1110  BP: 114/70  Weight: 153 lb 12.8 oz (69.8 kg)  Height: 5\' 1"  (1.549 m)   Body mass index is 29.06 kg/m. Constitutional: Well nourished, well developed female in no acute distress.  HEENT: normal Skin: Warm and dry.   Extremity: no edema  Respiratory:  Normal respiratory effort Neuro: DTRs 2+, Cranial nerves grossly intact Psych: Alert and Oriented x3. No memory deficits. Normal mood and affect.  MS: normal gait, normal bilateral lower extremity ROM/strength/stability.  Pelvic exam:  is not limited by  body habitus EGBUS: within normal limits Vagina: within normal limits and with normal mucosa, pink on aptima swab  Data: UPT is negative  Assessment/Plan:     30 y.o. G3 P34 female presenting for STD and vaginitis testing  Aptima: vaginitis/STD Follow up as needed after lab results   [name] CNM [facility] 04/24/2020, 11:41 AM

## 2020-04-26 LAB — CERVICOVAGINAL ANCILLARY ONLY
Bacterial Vaginitis (gardnerella): NEGATIVE
Candida Glabrata: NEGATIVE
Candida Vaginitis: NEGATIVE
Chlamydia: NEGATIVE
Comment: NEGATIVE
Comment: NEGATIVE
Comment: NEGATIVE
Comment: NEGATIVE
Comment: NEGATIVE
Comment: NORMAL
Neisseria Gonorrhea: NEGATIVE
Trichomonas: NEGATIVE

## 2020-05-21 ENCOUNTER — Other Ambulatory Visit (HOSPITAL_COMMUNITY)
Admission: RE | Admit: 2020-05-21 | Discharge: 2020-05-21 | Disposition: A | Discharge status: Home / Self Care | Payer: Medicaid Other | Source: Ambulatory Visit | Attending: Advanced Practice Midwife | Admitting: Advanced Practice Midwife

## 2020-05-21 ENCOUNTER — Encounter: Payer: Self-pay | Admitting: Advanced Practice Midwife

## 2020-05-21 ENCOUNTER — Other Ambulatory Visit: Payer: Self-pay

## 2020-05-21 ENCOUNTER — Ambulatory Visit (INDEPENDENT_AMBULATORY_CARE_PROVIDER_SITE_OTHER): Payer: Medicaid Other | Admitting: Advanced Practice Midwife

## 2020-05-21 VITALS — BP 100/60 | Ht 61.0 in | Wt 156.0 lb

## 2020-05-21 DIAGNOSIS — N898 Other specified noninflammatory disorders of vagina: Secondary | ICD-10-CM

## 2020-05-21 DIAGNOSIS — Z113 Encounter for screening for infections with a predominantly sexual mode of transmission: Secondary | ICD-10-CM | POA: Diagnosis present

## 2020-05-21 MED ORDER — HYDROCORTISONE 2.5 % EX CREA: TOPICAL_CREAM | Freq: Two times a day (BID) | CUTANEOUS | 0 refills | Status: DC

## 2020-05-23 ENCOUNTER — Encounter: Payer: Self-pay | Admitting: Advanced Practice Midwife

## 2020-05-23 LAB — CERVICOVAGINAL ANCILLARY ONLY
Bacterial Vaginitis (gardnerella): NEGATIVE
Candida Glabrata: NEGATIVE
Candida Vaginitis: NEGATIVE
Chlamydia: NEGATIVE
Comment: NEGATIVE
Comment: NEGATIVE
Comment: NEGATIVE
Comment: NEGATIVE
Comment: NEGATIVE
Comment: NORMAL
Neisseria Gonorrhea: POSITIVE — AB
Trichomonas: NEGATIVE

## 2020-05-23 NOTE — Progress Notes (Signed)
Patient ID: Katelyn Mendoza, female   DOB: 03-Dec-1990, 30 y.o.   MRN: [id number]  Reason for Consult: Vaginitis (Itching and burning after sex, vaginal irritation. Requesting STD testing  RM 5)    Subjective:  Date of Service: 05/21/2020  HPI:  Katelyn Mendoza is a 30 y.o. female being seen for vaginal irritation after intercourse. She did use a condom and reports irritation both with and without condom use. She admits a little itching and thin white discharge. She denies odor or urinary symptoms. She requests vaginitis/STD testing. She does admit vaginal dryness with intercourse. She is already on a higher dose estrogen BC pill. We discussed using lubricant and short term use of hydrocortisone cream for the irritation/itch.  Past Medical History:  Diagnosis Date  . Gallstones   . HSV (herpes simplex virus) infection   . Mental disorder    bipolar disorder dx'd at age 62  . Sickle cell trait (HCC)   . Spinal headache   . Urinary tract infection affecting care of mother, antepartum    Family History  Problem Relation Age of Onset  . Diabetes Mother   . Hypertension Mother   . Asthma Mother   . Asthma Brother   . Colon cancer Maternal Grandfather    Past Surgical History:  Procedure Laterality Date  . CHOLECYSTECTOMY  05/2019  . NO PAST SURGERIES    . spontaneous vaginal deliver     twins 12/20  . WISDOM TOOTH EXTRACTION      Short Social History:  Social History   Tobacco Use  . Smoking status: Never Smoker  . Smokeless tobacco: Never Used  Substance Use Topics  . Alcohol use: Yes    Alcohol/week: 1.0 standard drink    Types: 1 Glasses of wine per week    Comment: last use 02/05/20    No Known Allergies  Current Outpatient Medications  Medication Sig Dispense Refill  . hydrocortisone 2.5 % cream Apply topically 2 (two) times daily. 30 g 0  . norgestimate-ethinyl estradiol (ORTHO-CYCLEN) 0.25-35 MG-MCG tablet Take 1 tablet by mouth daily. 84 tablet 3   No  current facility-administered medications for this visit.   Review of Systems  Constitutional: Negative for chills and fever.  HENT: Negative for congestion, ear discharge, ear pain, hearing loss, sinus pain and sore throat.   Eyes: Negative for blurred vision and double vision.  Respiratory: Negative for cough, shortness of breath and wheezing.   Cardiovascular: Negative for chest pain, palpitations and leg swelling.  Gastrointestinal: Negative for abdominal pain, blood in stool, constipation, diarrhea, heartburn, melena, nausea and vomiting.  Genitourinary: Negative for dysuria, flank pain, frequency, hematuria and urgency.       Positive for vaginal irritation, itch, discharge  Musculoskeletal: Negative for back pain, joint pain and myalgias.  Skin: Negative for itching and rash.  Neurological: Negative for dizziness, tingling, tremors, sensory change, speech change, focal weakness, seizures, loss of consciousness, weakness and headaches.  Endo/Heme/Allergies: Negative for environmental allergies. Does not bruise/bleed easily.  Psychiatric/Behavioral: Negative for depression, hallucinations, memory loss, substance abuse and suicidal ideas. The patient is not nervous/anxious and does not have insomnia.        Objective:  Objective   Vitals:   05/21/20 1530  BP: 100/60  Weight: 156 lb (70.8 kg)  Height: 5\' 1"  (1.549 m)   Body mass index is 29.48 kg/m. Constitutional: Well nourished, well developed female in no acute distress.  HEENT: normal Skin: Warm and dry.  Cardiovascular: Regular  rate and rhythm.   Respiratory: Clear to auscultation bilateral. Normal respiratory effort Neuro: DTRs 2+, Cranial nerves grossly intact Psych: Alert and Oriented x3. No memory deficits. Normal mood and affect.  MS: normal gait, normal bilateral lower extremity ROM/strength/stability.  Pelvic exam:  is not limited by body habitus EGBUS: within normal limits Vagina: Mucosa dry and  reddened   Assessment/Plan:     30 y.o. G3 P 2013 with vaginal irritation  Aptima: vaginitis/STD Rx hydrocortisone cream for external use Follow up as needed after labs result Use vaginal lubricant as needed   [name] CNM [facility] 05/23/2020, 10:22 AM

## 2020-05-24 ENCOUNTER — Ambulatory Visit (INDEPENDENT_AMBULATORY_CARE_PROVIDER_SITE_OTHER): Payer: Medicaid Other

## 2020-05-24 ENCOUNTER — Other Ambulatory Visit: Payer: Self-pay | Admitting: Advanced Practice Midwife

## 2020-05-24 ENCOUNTER — Other Ambulatory Visit: Payer: Self-pay

## 2020-05-24 DIAGNOSIS — A549 Gonococcal infection, unspecified: Secondary | ICD-10-CM

## 2020-05-24 MED ORDER — CEFTRIAXONE SODIUM 250 MG IJ SOLR
500.0000 mg | Freq: Once | INTRAMUSCULAR | Status: AC
  Administered 2020-05-24: 500 mg via INTRAMUSCULAR
  Filled 2020-05-24: qty 500

## 2020-05-24 MED ORDER — CEFTRIAXONE SODIUM 500 MG IJ SOLR
500.0000 mg | Freq: Once | INTRAMUSCULAR | Status: AC
  Administered 2020-05-24: 500 mg via INTRAMUSCULAR

## 2020-05-24 NOTE — Progress Notes (Signed)
Patient notified of need for treatment for gonorrhea. She has 3:30 pm appointment today for rocephin IM. Other instructions given. Communicable disease reporting done.

## 2020-05-24 NOTE — Progress Notes (Signed)
Pt here for inj of Ceftriaxone 500mg  which was reconstituted with 84ml 1% Xylocaine Lot # 0m; exp:10/2023; [phone] and given IM right glut.  Pt c/o not feeling well - nasal congestion, tired.  Adv to get tested for covid.

## 2020-06-06 ENCOUNTER — Encounter: Payer: Self-pay | Admitting: Obstetrics

## 2020-06-06 ENCOUNTER — Other Ambulatory Visit: Payer: Self-pay

## 2020-06-06 ENCOUNTER — Ambulatory Visit (INDEPENDENT_AMBULATORY_CARE_PROVIDER_SITE_OTHER): Payer: Medicaid Other | Admitting: Obstetrics

## 2020-06-06 ENCOUNTER — Other Ambulatory Visit (HOSPITAL_COMMUNITY)
Admission: RE | Admit: 2020-06-06 | Discharge: 2020-06-06 | Disposition: A | Discharge status: Home / Self Care | Payer: Medicaid Other | Source: Ambulatory Visit | Attending: Obstetrics | Admitting: Obstetrics

## 2020-06-06 VITALS — BP 122/70 | Ht 61.0 in | Wt 158.6 lb

## 2020-06-06 DIAGNOSIS — Z113 Encounter for screening for infections with a predominantly sexual mode of transmission: Secondary | ICD-10-CM | POA: Diagnosis present

## 2020-06-06 DIAGNOSIS — A549 Gonococcal infection, unspecified: Secondary | ICD-10-CM

## 2020-06-06 HISTORY — DX: Gonococcal infection, unspecified: A54.9

## 2020-06-06 NOTE — Progress Notes (Signed)
Obstetrics & Gynecology Office Visit   Chief Complaint: No chief complaint on file.   History of Present Illness: Katelyn Mendoza presents requesting some STI screening. Earlier this month she was treated for Gonorrhea and now RTC for a TOC. In addition, she is requesting HIV screening and the entire panel.  She has one sexual partner, who , she believes, has not been faithful.  She denies any vaginal discharge, irritation , or odor.   Review of Systems:  Review of Systems  Constitutional: Negative.   HENT: Negative.   Cardiovascular: Negative.   Genitourinary: Negative.   Musculoskeletal: Negative.   Skin: Negative.   Neurological: Negative.   Endo/Heme/Allergies: Negative.      Past Medical History:  Past Medical History:  Diagnosis Date  . Gallstones   . HSV (herpes simplex virus) infection   . Mental disorder    bipolar disorder dx'd at age 30  . Sickle cell trait (HCC)   . Spinal headache   . Urinary tract infection affecting care of mother, antepartum     Past Surgical History:  Past Surgical History:  Procedure Laterality Date  . CHOLECYSTECTOMY  05/2019  . NO PAST SURGERIES    . spontaneous vaginal deliver     twins 12/20  . WISDOM TOOTH EXTRACTION      Gynecologic History: Patient's last menstrual period was 05/08/2020.  Obstetric History: [id number]  Family History:  Family History  Problem Relation Age of Onset  . Diabetes Mother   . Hypertension Mother   . Asthma Mother   . Asthma Brother   . Colon cancer Maternal Grandfather     Social History:  Social History   Socioeconomic History  . Marital status: Single    Spouse name: Not on file  . Number of children: Not on file  . Years of education: Not on file  . Highest education level: Not on file  Occupational History  . Not on file  Tobacco Use  . Smoking status: Never Smoker  . Smokeless tobacco: Never Used  Vaping Use  . Vaping Use: Never used  Substance and Sexual Activity  . Alcohol  use: Yes    Alcohol/week: 1.0 standard drink    Types: 1 Glasses of wine per week    Comment: last use 02/05/20  . Drug use: Yes    Types: Marijuana    Comment: last use 12/2019  . Sexual activity: Yes    Partners: Male    Birth control/protection: Pill  Other Topics Concern  . Not on file  Social History Narrative  . Not on file   Social Determinants of Health   Financial Resource Strain: Not on file  Food Insecurity: Not on file  Transportation Needs: Not on file  Physical Activity: Not on file  Stress: Not on file  Social Connections: Not on file  Intimate Partner Violence: Not At Risk  . Fear of Current or Ex-Partner: No  . Emotionally Abused: No  . Physically Abused: No  . Sexually Abused: No    Allergies:  No Known Allergies  Medications: Prior to Admission medications   Medication Sig Start Date End Date Taking? Authorizing Provider  hydrocortisone 2.5 % cream Apply topically 2 (two) times daily. 05/21/20  Yes [name], CNM  norgestimate-ethinyl estradiol (ORTHO-CYCLEN) 0.25-35 MG-MCG tablet Take 1 tablet by mouth daily. 01/02/20  Yes Mirna Mires, CNM    Physical Exam Vitals:  Vitals:   06/06/20 1603  BP: 122/70   Patient's last menstrual  period was 05/08/2020.  Physical Exam Vitals reviewed.  Constitutional:      Appearance: Normal appearance.  HENT:     Head: Normocephalic and atraumatic.     Nose: Nose normal.  Cardiovascular:     Rate and Rhythm: Normal rate and regular rhythm.     Pulses: Normal pulses.     Heart sounds: Normal heart sounds.  Pulmonary:     Effort: Pulmonary effort is normal.     Breath sounds: Normal breath sounds.  Genitourinary:    Comments: No rashes or lesions noted. Normal vaginal mucosa, with scant white discharge. Cervical ancillary probe retrieved. Musculoskeletal:        General: Normal range of motion.     Cervical back: Normal range of motion and neck supple.  Skin:    General: Skin is warm and dry.   Neurological:     General: No focal deficit present.     Mental Status: She is alert and oriented to person, place, and time.  Psychiatric:        Mood and Affect: Mood normal.        Behavior: Behavior normal.      Assessment: 30 y.o. G3P2013 for STI screening and GC TOC.   Plan: Problem List Items Addressed This Visit      Other   GC (gonococcus infection)    Other Visit Diagnoses    Routine screening for STI (sexually transmitted infection)    -  Primary   Relevant Orders   Cervicovaginal ancillary only   HEP, RPR, HIV Panel   Hepatitis C Antibody    Will contact her with the lab results.  Mirna Mires, CNM  06/06/2020 5:03 PM

## 2020-06-07 LAB — HEP, RPR, HIV PANEL
HIV Screen 4th Generation wRfx: NONREACTIVE
Hepatitis B Surface Ag: NEGATIVE
RPR Ser Ql: NONREACTIVE

## 2020-06-07 LAB — HEPATITIS C ANTIBODY: Hep C Virus Ab: <0.1 s/co ratio (ref 0.0–0.9)

## 2020-06-08 ENCOUNTER — Encounter: Payer: Self-pay | Admitting: Obstetrics

## 2020-06-11 ENCOUNTER — Telehealth: Payer: Self-pay

## 2020-06-11 ENCOUNTER — Other Ambulatory Visit: Payer: Self-pay | Admitting: Obstetrics

## 2020-06-11 DIAGNOSIS — A549 Gonococcal infection, unspecified: Secondary | ICD-10-CM

## 2020-06-11 DIAGNOSIS — B9689 Other specified bacterial agents as the cause of diseases classified elsewhere: Secondary | ICD-10-CM

## 2020-06-11 LAB — CERVICOVAGINAL ANCILLARY ONLY
Bacterial Vaginitis (gardnerella): POSITIVE — AB
Candida Glabrata: NEGATIVE
Candida Vaginitis: NEGATIVE
Chlamydia: NEGATIVE
Comment: NEGATIVE
Comment: NEGATIVE
Comment: NEGATIVE
Comment: NEGATIVE
Comment: NEGATIVE
Comment: NORMAL
Neisseria Gonorrhea: POSITIVE — AB
Trichomonas: NEGATIVE

## 2020-06-11 MED ORDER — CEFTRIAXONE SODIUM 500 MG IJ SOLR: 500.0000 mg | Freq: Once | INTRAMUSCULAR | Status: DC

## 2020-06-11 MED ORDER — METRONIDAZOLE 0.75 % VA GEL: 1.0000 | Freq: Every day | VAGINAL | 1 refills | Status: AC

## 2020-06-11 NOTE — Telephone Encounter (Signed)
Pt calling; only sees her blood work results not the GC/Chlm.  [phone]  Adv those results aren't back yet.  They were taken late Thurs pm; probably not processed until Fri; due to Holiday weekend we haven't gotten them back yet.

## 2020-06-11 NOTE — Progress Notes (Signed)
metrogel 

## 2020-06-11 NOTE — Progress Notes (Signed)
Patient contacted by phone about her results. As it has been two weeks only since she was treated for the [facility], will treat for BV, and arrange a TOC in another 2 weeks. She admits to IC with her BF and "the condom broke". She is uncertain whether he actually got treated for [facility] as well.  TOC planned for several weeks form now. Rx for Metrogel sent to her pharmacy.

## 2020-06-14 ENCOUNTER — Ambulatory Visit: Payer: Medicaid Other

## 2020-06-18 ENCOUNTER — Other Ambulatory Visit (HOSPITAL_COMMUNITY)
Admission: RE | Admit: 2020-06-18 | Discharge: 2020-06-18 | Disposition: A | Discharge status: Home / Self Care | Payer: Medicaid Other | Source: Ambulatory Visit | Attending: Advanced Practice Midwife | Admitting: Advanced Practice Midwife

## 2020-06-18 ENCOUNTER — Encounter: Payer: Self-pay | Admitting: Advanced Practice Midwife

## 2020-06-18 ENCOUNTER — Ambulatory Visit (INDEPENDENT_AMBULATORY_CARE_PROVIDER_SITE_OTHER): Payer: Medicaid Other | Admitting: Advanced Practice Midwife

## 2020-06-18 ENCOUNTER — Other Ambulatory Visit: Payer: Self-pay

## 2020-06-18 VITALS — BP 120/80 | Ht 61.0 in | Wt 160.0 lb

## 2020-06-18 DIAGNOSIS — Z113 Encounter for screening for infections with a predominantly sexual mode of transmission: Secondary | ICD-10-CM

## 2020-06-18 DIAGNOSIS — R102 Pelvic and perineal pain: Secondary | ICD-10-CM

## 2020-06-18 DIAGNOSIS — N926 Irregular menstruation, unspecified: Secondary | ICD-10-CM | POA: Diagnosis not present

## 2020-06-18 DIAGNOSIS — N949 Unspecified condition associated with female genital organs and menstrual cycle: Secondary | ICD-10-CM | POA: Insufficient documentation

## 2020-06-18 LAB — POCT URINE PREGNANCY: Preg Test, Ur: NEGATIVE

## 2020-06-20 ENCOUNTER — Encounter: Payer: Self-pay | Admitting: Advanced Practice Midwife

## 2020-06-20 LAB — CERVICOVAGINAL ANCILLARY ONLY
Bacterial Vaginitis (gardnerella): NEGATIVE
Candida Glabrata: NEGATIVE
Candida Vaginitis: NEGATIVE
Chlamydia: NEGATIVE
Comment: NEGATIVE
Comment: NEGATIVE
Comment: NEGATIVE
Comment: NEGATIVE
Comment: NEGATIVE
Comment: NORMAL
Neisseria Gonorrhea: NEGATIVE
Trichomonas: NEGATIVE

## 2020-06-20 NOTE — Progress Notes (Signed)
Patient ID: Katelyn Mendoza, female   DOB: October 30, 1990, 30 y.o.   MRN: [id number]  Reason for Consult: std check    Subjective:  Date of Service: 06/18/2020  HPI:  Katelyn Mendoza is a 30 y.o. female being seen for pain that is located at her "rectum" that she has noticed for the past 4 or 5 days. The pain is described as burning and irritation. She denies vaginal itching, irritation, discharge or odor. She denies urinary symptoms. She denies any outbreak of HSV. She requests a test of cure today for gonorrhea that was treated a couple of weeks ago. She also wants a pregnancy test due to irregular periods/broken condom.   Past Medical History:  Diagnosis Date   Gallstones    HSV (herpes simplex virus) infection    Mental disorder    bipolar disorder dx'd at age 46   Sickle cell trait (HCC)    Spinal headache    Urinary tract infection affecting care of mother, antepartum    Family History  Problem Relation Age of Onset   Diabetes Mother    Hypertension Mother    Asthma Mother    Asthma Brother    Colon cancer Maternal Grandfather    Past Surgical History:  Procedure Laterality Date   CHOLECYSTECTOMY  05/2019   NO PAST SURGERIES     spontaneous vaginal deliver     twins 12/20   WISDOM TOOTH EXTRACTION      Short Social History:  Social History   Tobacco Use   Smoking status: Never   Smokeless tobacco: Never  Substance Use Topics   Alcohol use: Yes    Alcohol/week: 1.0 standard drink    Types: 1 Glasses of wine per week    Comment: last use 02/05/20    No Known Allergies  Current Outpatient Medications  Medication Sig Dispense Refill   norgestimate-ethinyl estradiol (ORTHO-CYCLEN) 0.25-35 MG-MCG tablet Take 1 tablet by mouth daily. 84 tablet 3   hydrocortisone 2.5 % cream Apply topically 2 (two) times daily. (Patient not taking: Reported on 06/18/2020) 30 g 0   No current facility-administered medications for this visit.    Review of Systems  Constitutional:   Negative for chills and fever.  HENT:  Negative for congestion, ear discharge, ear pain, hearing loss, sinus pain and sore throat.   Eyes:  Negative for blurred vision and double vision.  Respiratory:  Negative for cough, shortness of breath and wheezing.   Cardiovascular:  Negative for chest pain, palpitations and leg swelling.  Gastrointestinal:  Negative for abdominal pain, blood in stool, constipation, diarrhea, heartburn, melena, nausea and vomiting.  Genitourinary:  Negative for dysuria, flank pain, frequency, hematuria and urgency.       Pain at "rectum"  Musculoskeletal:  Negative for back pain, joint pain and myalgias.  Skin:  Negative for itching and rash.  Neurological:  Negative for dizziness, tingling, tremors, sensory change, speech change, focal weakness, seizures, loss of consciousness, weakness and headaches.  Endo/Heme/Allergies:  Negative for environmental allergies. Does not bruise/bleed easily.  Psychiatric/Behavioral:  Negative for depression, hallucinations, memory loss, substance abuse and suicidal ideas. The patient is not nervous/anxious and does not have insomnia.        Objective:  Objective   Vitals:   06/18/20 1409  BP: 120/80  Weight: 160 lb (72.6 kg)  Height: 5\' 1"  (1.549 m)   Body mass index is 30.23 kg/m. Constitutional: Well nourished, well developed female in no acute distress.  HEENT: normal  Skin: Warm and dry.  Cardiovascular: Regular rate and rhythm.   Respiratory: Clear to auscultation bilateral. Normal respiratory effort Neuro: DTRs 2+, Cranial nerves grossly intact Psych: Alert and Oriented x3. No memory deficits. Normal mood and affect.  MS: normal gait, normal bilateral lower extremity ROM/strength/stability.  Pelvic exam:  is not limited by body habitus EGBUS: within normal limits Vagina: within normal limits and with normal mucosa, positive for perineal scrape, no evidence of HSV   Data: Results for KASY, IANNACONE (MRN  [id number]) as of 06/20/2020 13:38  Ref. Range 06/18/2020 14:41 06/18/2020 14:50  Preg Test, Ur Latest Ref Range: Negative  Negative   Chlamydia Unknown  Negative  Neisseria Gonorrhea Unknown  Negative  Trichomonas Unknown  Negative  Candida Vaginitis Unknown  Negative  Candida Glabrata Unknown  Negative  Bacterial Vaginitis (gardnerella) Unknown  Negative    Time spent in care and management of this patient: 18 minutes Assessment/Plan:     30 y.o. G3 P65 female with perineal abrasion  Aptima POCT: pregnancy Follow up as needed Sea Salt tub soaks A&D or antibiotic ointment or coconut oil to affected area     [name] CNM [facility] 06/20/2020, 1:42 PM

## 2020-07-05 IMAGING — US US MFM OB FOLLOW-UP
2 series · 16 of 28 positions shown · non-contrast
Comparison: none

PATIENT INFO:

PERFORMED BY:
                   Sonographer                              BRISTOW
SERVICE(S) PROVIDED:
 ----------------------------------------------------------------------
INDICATIONS:
  35 weeks gestation of pregnancy
FETAL EVALUATION (FETUS A):
 Num Of Fetuses:         2
 Fetal Heart Rate(bpm):  137
 Cardiac Activity:       Present
 Presentation:           Cephalic
 Placenta:               Posterior
BIOMETRY (FETUS A):
 BPD:      87.6  mm     G. Age:  35w 3d         60  %    CI:         78.6   %    70 - 86
                                                         FL/HC:      21.2   %    20.1 -
 HC:      312.5  mm     G. Age:  35w 0d         16  %    HC/AC:      1.01        0.93 -
 AC:      309.1  mm     G. Age:  34w 6d         49  %    FL/BPD:     75.5   %    71 - 87
 FL:       66.1  mm     G. Age:  34w 0d         18  %    FL/AC:      21.4   %    20 - 24
 HUM:      60.2  mm     G. Age:  35w 0d         60  %
 Est. FW:    4828  gm      5 lb 8 oz     38  %     FW Discordancy     0 \ 15 %
OB HISTORY:
 Gravidity:    3
 Living:       1
GESTATIONAL AGE (FETUS A):
 LMP:           35w 1d        Date:  03/27/18                 EDD:   01/01/19
 U/S Today:     34w 6d                                        EDD:   01/03/19
 Best:          35w 1d     Det. By:  LMP  (03/27/18)          EDD:   01/01/19
ANATOMY (FETUS A):
 Cavum:                 Suboptimal             LVOT:                   Within Normal Limits
 Ventricles:            Suboptimal             Stomach:                Within Normal Limits
 Heart:                 4-Chamber view         Kidneys:                Within Normal Limits
                        appears normal
 RVOT:                  Within Normal Limits   Bladder:                Within Normal Limits
DOPPLER - FETAL VESSELS (FETUS A):
 Umbilical Artery
  S/D     %tile     RI    %tile                     PSV
                                                  (cm/s)
 2.44       49   0.[REDACTED]
FETAL EVALUATION (FETUS B):
 Fetal Heart Rate(bpm):  140
                             Largest Pocket(cm)
BIOMETRY (FETUS B):
 BPD:      69.6  mm     G. Age:  28w 0d        < 1  %    CI:        58.68   %    70 - 86
                                                         FL/HC:      22.1   %    20.1 -
 HC:      295.5  mm     G. Age:  32w 5d        < 3  %    HC/AC:      0.99        0.93 -
 AC:      298.8  mm     G. Age:  33w 6d         22  %    FL/BPD:     93.7   %    71 - 87
 FL:       65.2  mm     G. Age:  33w 4d         10  %    FL/AC:      21.8   %    20 - 24
 HUM:      59.1  mm     G. Age:  34w 1d         48  %
 Est. FW:    9009  gm    4 lb 11 oz       8  %     FW Discordancy        15  %
GESTATIONAL AGE (FETUS B):
 U/S Today:     32w 0d                                        EDD:   01/23/19
ANATOMY (FETUS B):
 Cranium:               See physician's        LVOT:                   Within Normal Limits
                        comments
 Cavum:                 Within Normal Limits   Stomach:                Seen
 Ventricles:            Within Normal Limits   Kidneys:                Within Normal Limits
 Heart:                 4-Chamber view         Bladder:                Within Normal Limits
 RVOT:                  Within Normal Limits
DOPPLER - FETAL VESSELS (FETUS B):
    3       76   0.[REDACTED]

[Series 1: us mfm ob follow-up · 0.25mm/px · 15 of 59 slices shown (1 of 2)]
[im 1/59]
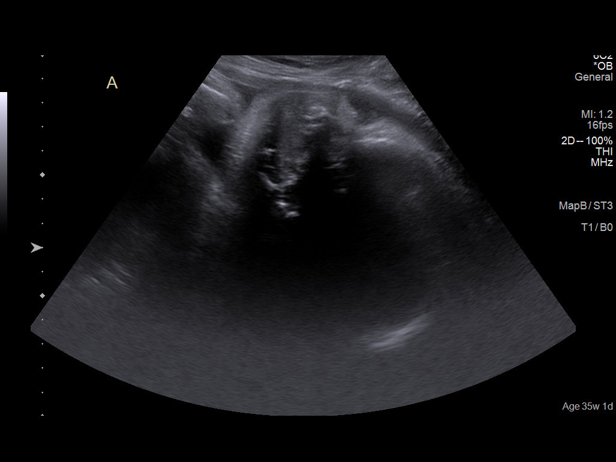
[im 5/59]
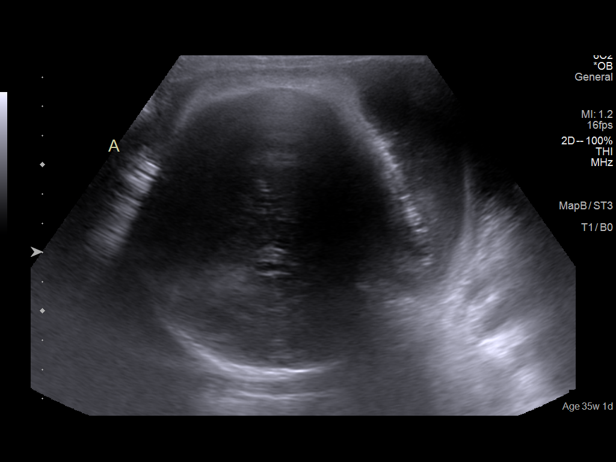
[im 10/59]
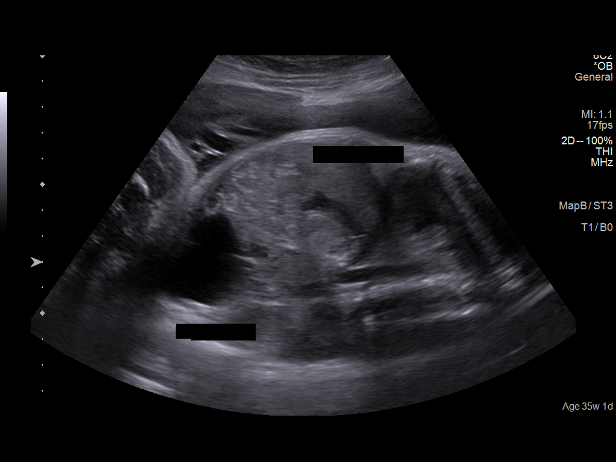
[im 14/59]
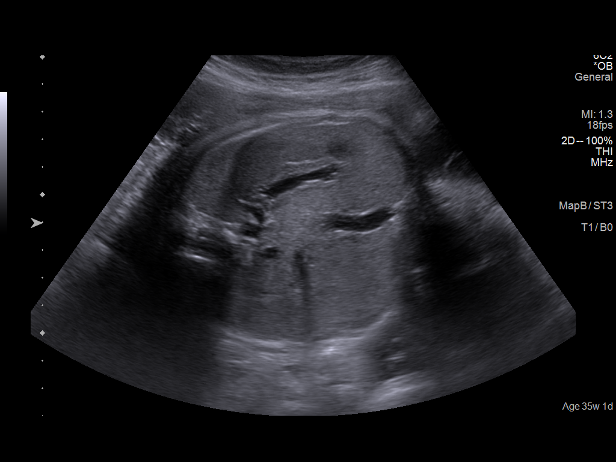
[im 17/59]
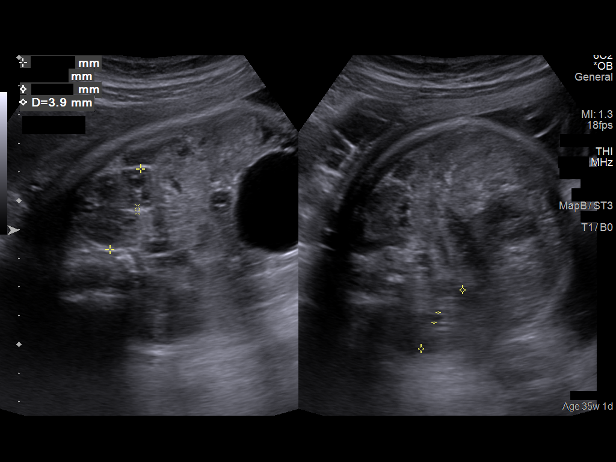
[im 21/59]
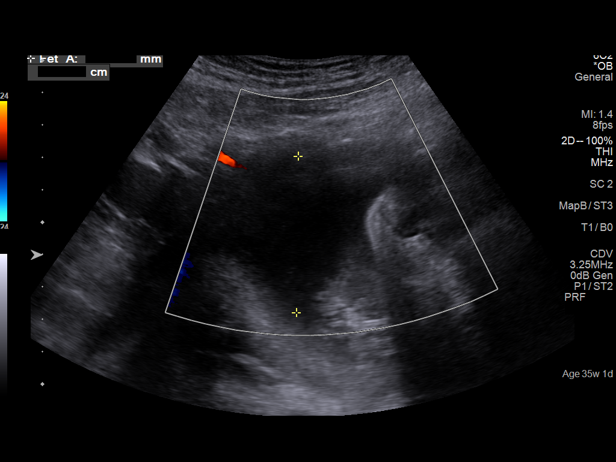
[im 26/59]
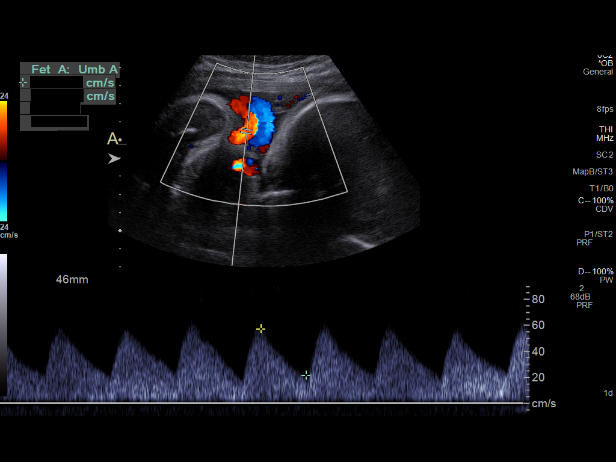
[im 31/59]
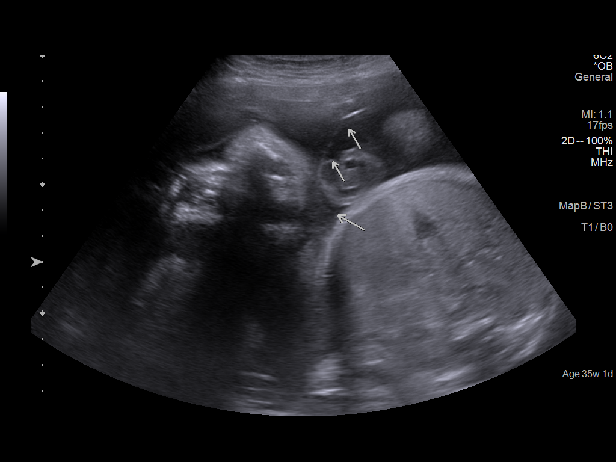
[im 33/59]
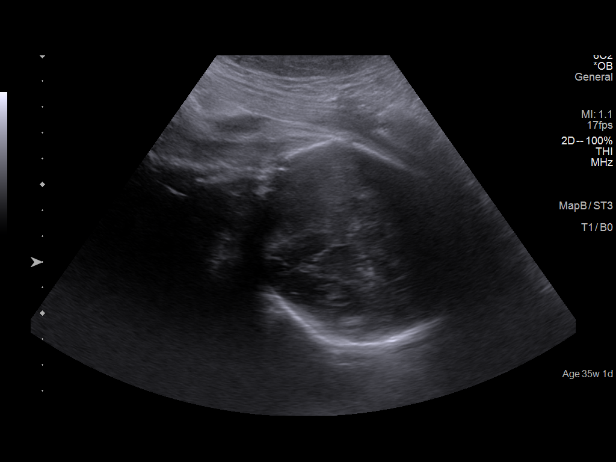
[im 38/59]
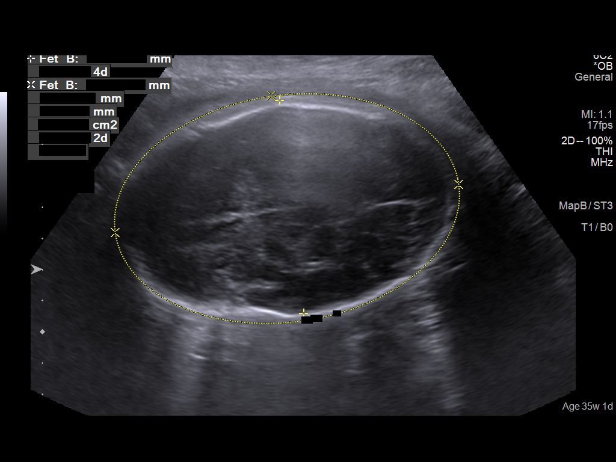
[im 42/59]
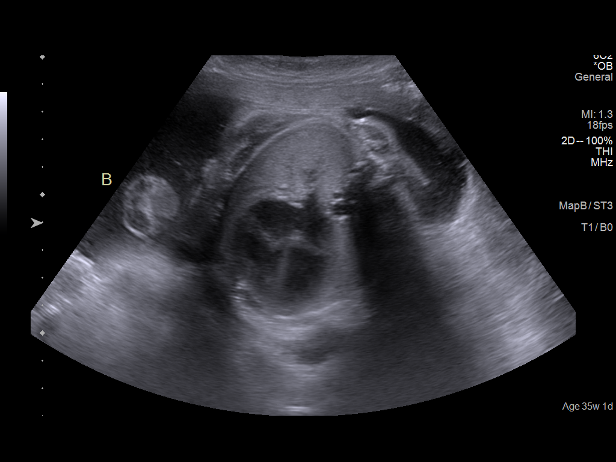
[im 47/59]
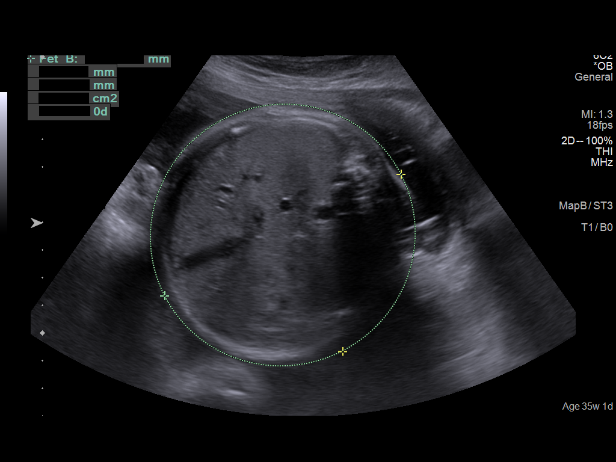
[im 49/59]
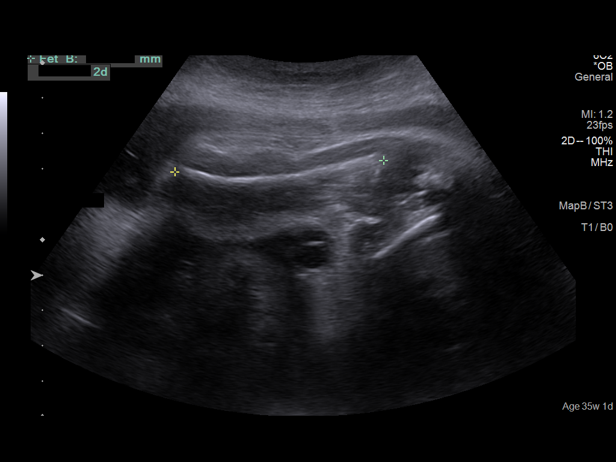
[im 54/59]
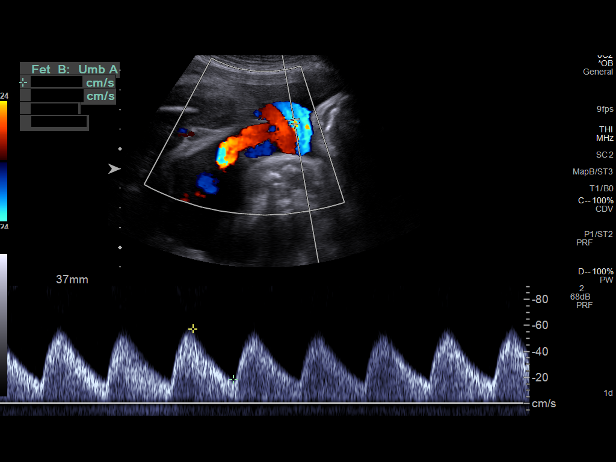
[im 59/59]
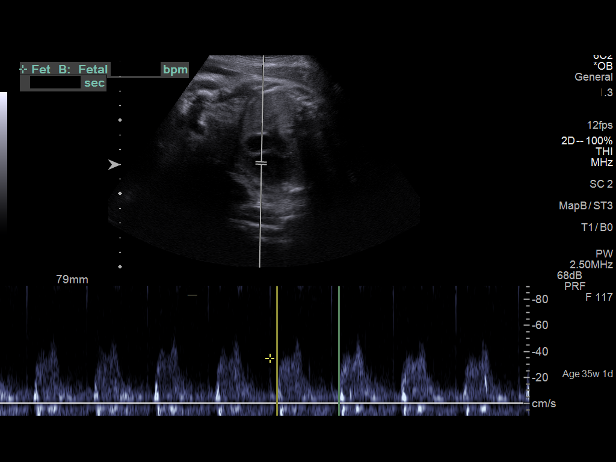

[Series 1002: us mfm ob follow-up · 0.20mm/px · 1 of 4 slices shown (2 of 2)]
[im 4/4]
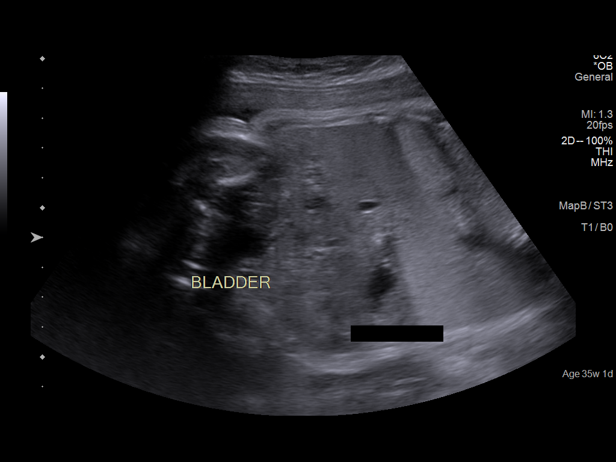

[16 of 28 positions shown; findings below may reference images not displayed]

IMPRESSION: Dear Dr.  BRISTOW
 ,

 Thank you for referring your patient for ultrasound for HOOTEN/Ebadat
 Klever growth evaluation.

 Labelle Salha Brack Tiger pregnancy is identified. with Oriel Yoo of 35w 1d
 based on LMP c/w earliest scan performed at [facility] on
 05/27/2018 at 8w 2d.

 A thin dividing membrane is once again identified.

 Twin A is located on the maternal     right  side (posterior
 placenta).
 Twin B is located on the maternal  left     side /posterior
 placenta).

 Twin A is cephalic.  Twin a is at the 38th %ile.BPP [DATE] The
 MVP for Twin A is 4.8   cm.  S/D ratio of the midcord on A
 2.44 ( 49th %ile)
 Twin B is cephalic. Twin B is at the 8th %ile  BPP [DATE]  The
 MVP for Twin B is  3.3  cm. S/D ratio on the midcord on B is 3
 (76th %ile )
 HC is lagging on Twin B measures -1Sd -( generally
 considered insignificant unless smaller than -2 SD )
 intracranial findings normal.

 The fetal weight discordance measured 15%.

 The amniotic fluid is within normal limits in each sac.

 The patient is planning a SVD . Our practice pattern is to
 induce Haji Omar Harifal twins at 37 weeks . If there is any concern
 with maternal blood pressure or fetal monitoring then 36
 weeks.

 Thank you for allowing us to participate in your patient's care.

## 2020-07-12 IMAGING — US US MFM OB LIMITED
2 series · 13 of 28 positions shown · non-contrast
Comparison: none

PATIENT INFO:

PERFORMED BY:
                   Sonographer                              ROMANTIQUE
SERVICE(S) PROVIDED:
  US MFM OB LIMITED                                    76815.01
 ----------------------------------------------------------------------
INDICATIONS:
  36 weeks gestation of pregnancy
FETAL EVALUATION (FETUS A):
 Num Of Fetuses:         2
 Fetal Heart Rate(bpm):  144
 Cardiac Activity:       Present
 Fetal Lie:              Maternal Right
 Presentation:           Vertex
 Placenta:               Posterior, fundal
 Membrane Desc:      Monochorionic - diamniotic
                             Largest Pocket(cm)
OB HISTORY:
 Gravidity:    3
 Living:       1
GESTATIONAL AGE (FETUS A):
 LMP:           36w 1d        Date:  03/27/18                 EDD:   01/01/19
 Best:          36w 1d     Det. By:  LMP  (03/27/18)          EDD:   01/01/19
DOPPLER - FETAL VESSELS (FETUS A):
 Umbilical Artery
  S/D     %tile
  2.4       50
FETAL EVALUATION (FETUS B):
 Fetal Heart Rate(bpm):  136
 Fetal Lie:              Maternal Left
GESTATIONAL AGE (FETUS B):
DOPPLER - FETAL VESSELS (FETUS B):
 2.49       55

[Series 1: us mfm ob limited · 0.26mm/px · 9 of 20 slices shown (1 of 2)]
[im 2/20]
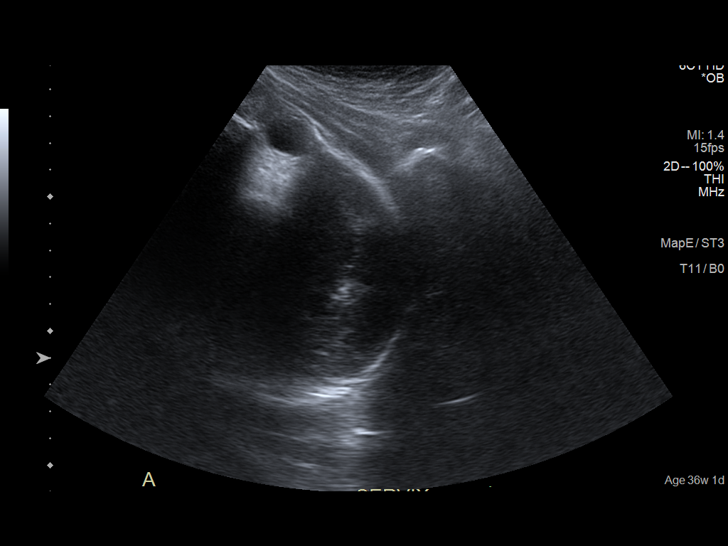
[im 4/20]
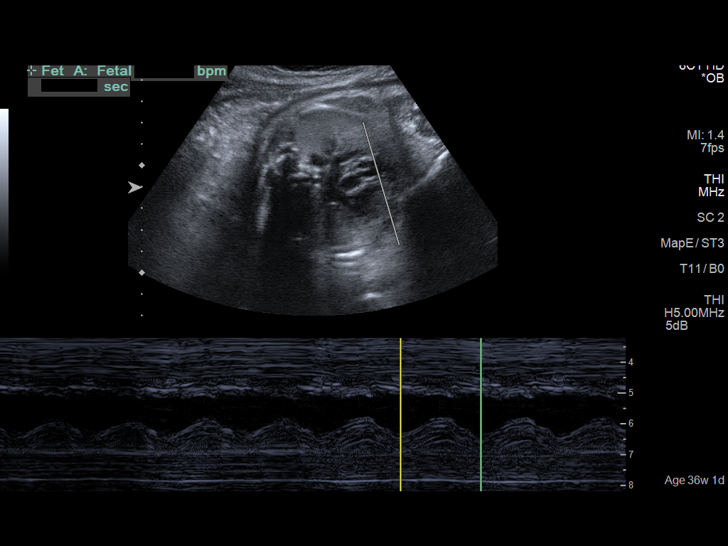
[im 6/20]
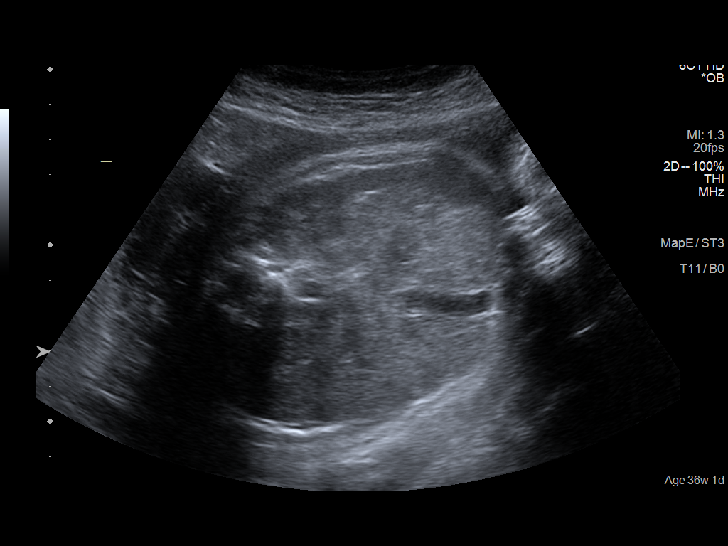
[im 8/20]
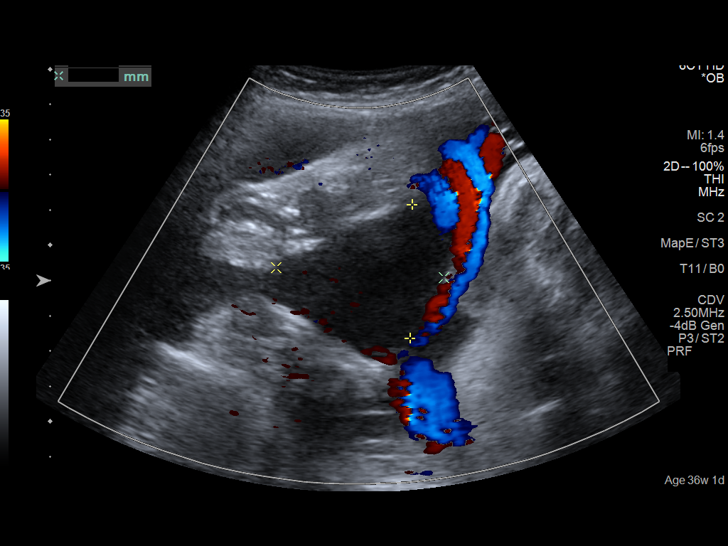
[im 10/20]
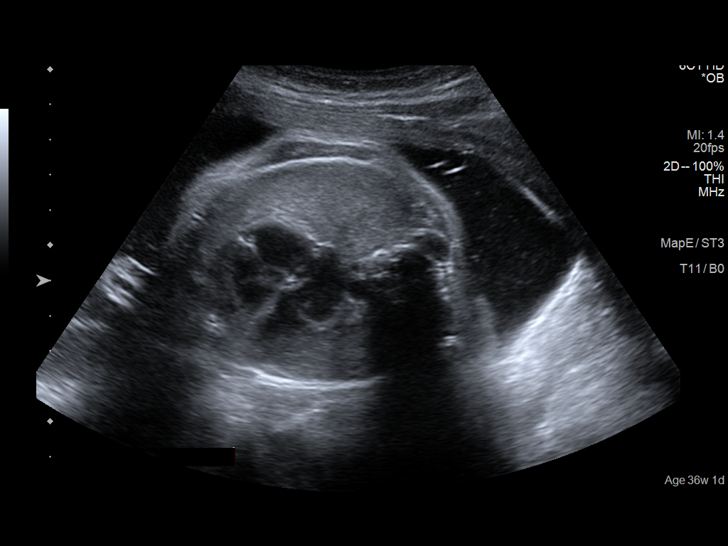
[im 12/20]
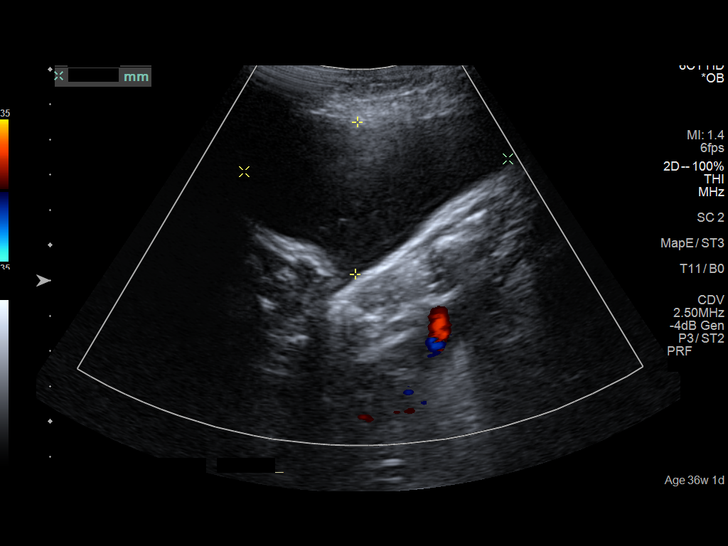
[im 15/20]
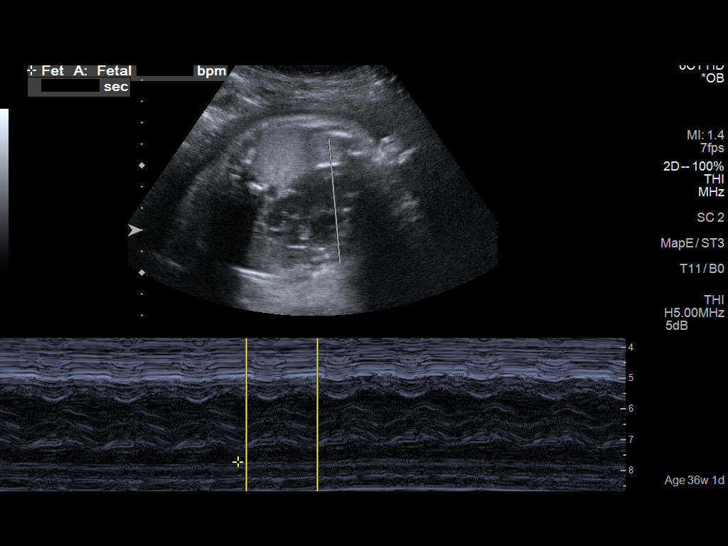
[im 17/20]
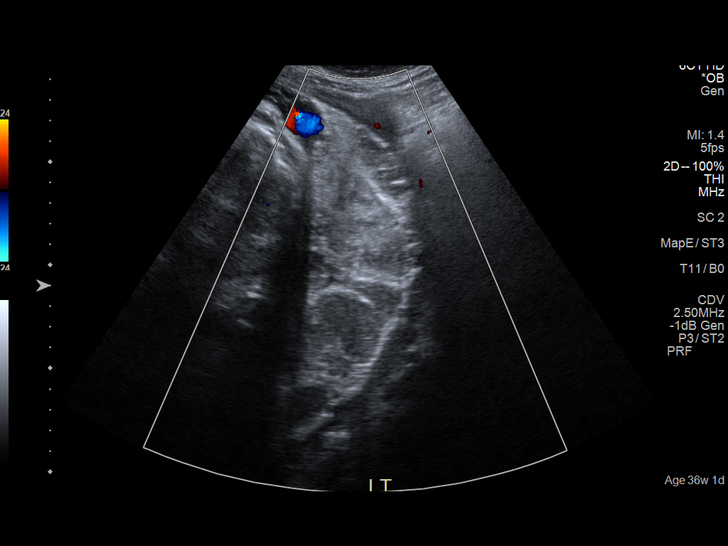
[im 20/20]
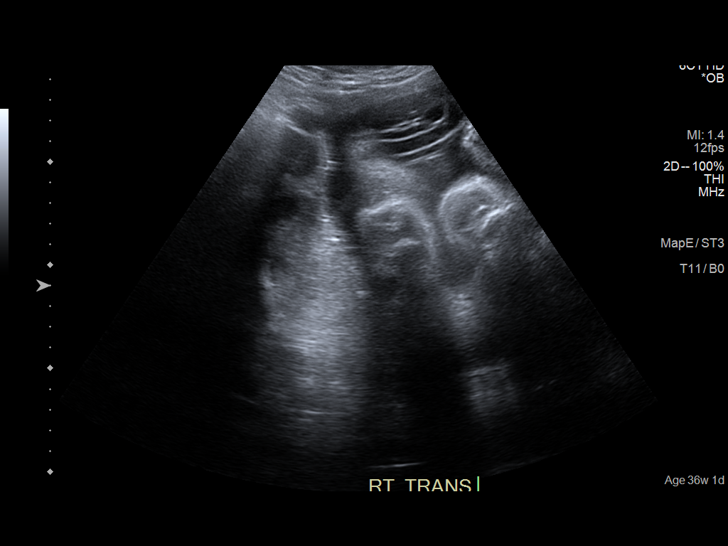

[Series 1001: us mfm ob limited · 4 of 10 slices shown (2 of 2)]
[im 2/10]
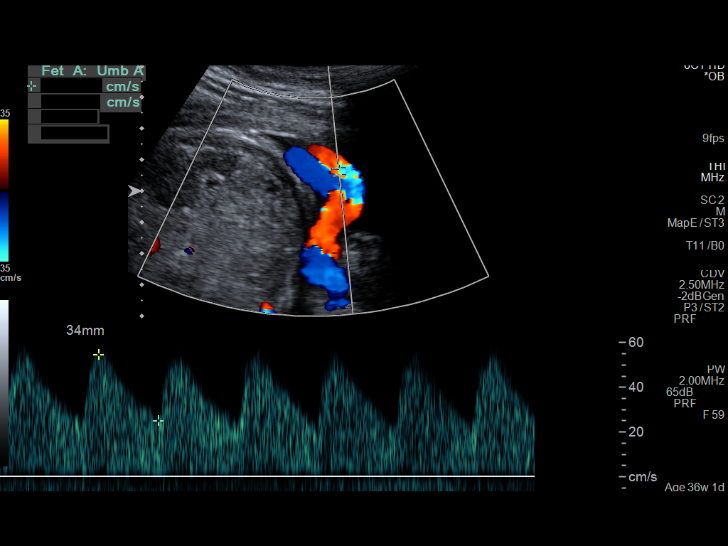
[im 4/10]
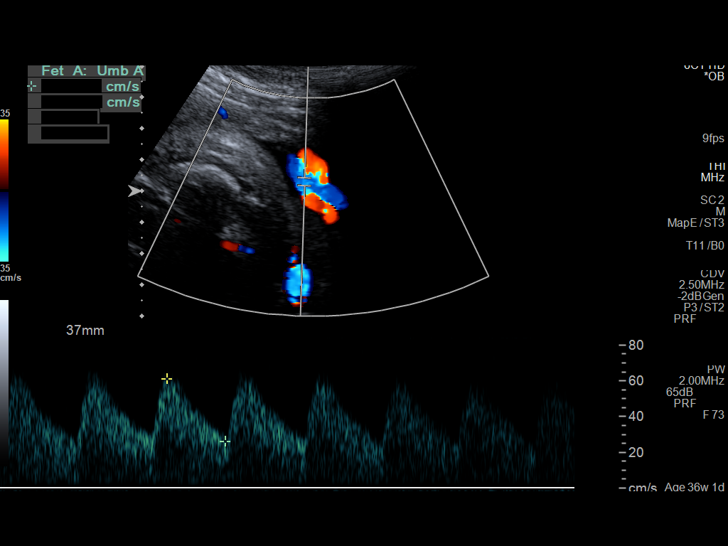
[im 6/10]
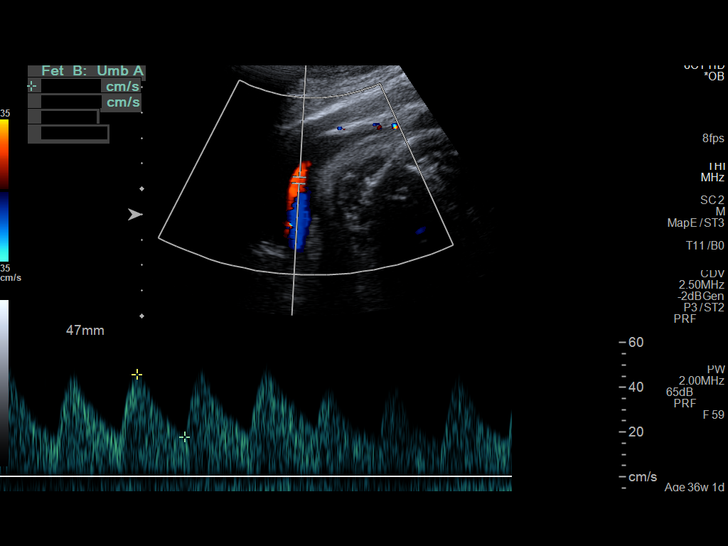
[im 8/10]
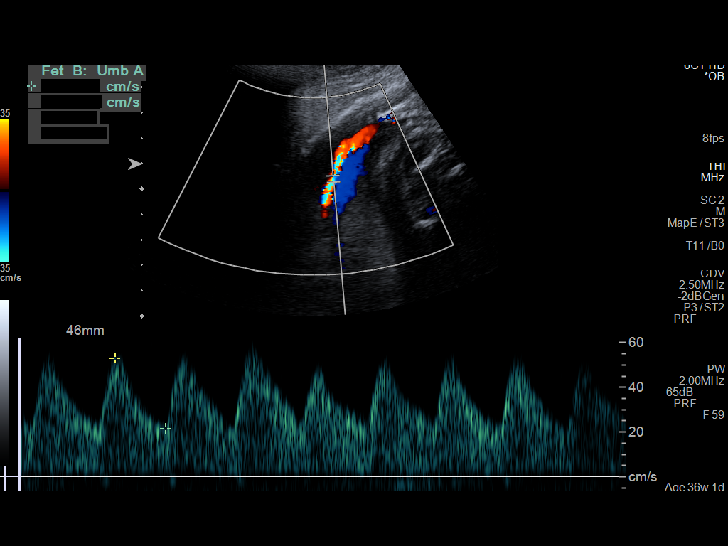

[13 of 28 positions shown; findings below may reference images not displayed]

IMPRESSION: Dear Dr.  ROMANTIQUE
 ,

 Thank you for referring your patient for a twin biophysical
 profile study (BPP).

 A twin pregnancy is identified. Ms Andre Manuel is now at 36 [DATE]
 weeks

 A dividing membrane is once again identified.

 Twin A is located on the maternal  right     side (posterior
 placenta).
 Twin B is located on the maternal   left    side (posterior
 placenta).

 Twin A is cephalic.
 Twin B is cephalic.

 The amniotic fluid is within normal limits in each sac.
 The MVP for Twin A is   3.8 cm.
 The MVP for Twin B is   4.3 cm.

 The BPP's were [DATE] x 2, which was reassuring.

 Doppler s/d ratio A =2.4
 doppler s/d ratio B =2.5

 Consider induction in the 37th week

 Thank you for allowing us to participate in your patient's care.

## 2020-07-19 ENCOUNTER — Encounter: Payer: Self-pay | Admitting: Advanced Practice Midwife

## 2020-07-19 ENCOUNTER — Ambulatory Visit: Payer: Medicaid Other

## 2020-07-19 ENCOUNTER — Ambulatory Visit: Payer: Medicaid Other | Admitting: Advanced Practice Midwife

## 2020-07-19 DIAGNOSIS — Z113 Encounter for screening for infections with a predominantly sexual mode of transmission: Secondary | ICD-10-CM

## 2020-07-19 LAB — WET PREP FOR TRICH, YEAST, CLUE
Trichomonas Exam: NEGATIVE
Yeast Exam: NEGATIVE

## 2020-07-19 LAB — HEMOGLOBIN, FINGERSTICK: Hemoglobin: 13.2 g/dL (ref 11.1–15.9)

## 2020-07-19 NOTE — Progress Notes (Signed)
Pt here for STD screening.  Wet mount results reviewed, no treatment required. Pt given condoms. Raynetta Osterloh M Kawena Lyday, RN  

## 2020-07-19 NOTE — Progress Notes (Signed)
[facility] Department STI clinic/screening visit  Subjective:  Katelyn Mendoza is a 30 y.o. SBF G3P3 exsmoker female being seen today for an STI screening visit. The patient reports they do have symptoms.  Patient reports that they do not desire a pregnancy in the next year.   They reported they are not interested in discussing contraception today.  No LMP recorded. (Menstrual status: Oral contraceptives).   Patient has the following medical conditions:   Patient Active Problem List   Diagnosis Date Noted   GC (gonococcus infection) 06/06/2020   Status post laparoscopic cholecystectomy 06/22/2019   BMI 30.0-30.9,adult 07/08/2018   Bipolar I disorder (HCC) 07/04/2018   Vulvovaginitis due to yeast 07/04/2018   HSV infection 05/10/2018    Chief Complaint  Patient presents with   SEXUALLY TRANSMITTED DISEASE    screening    HPI  Patient reports "bread smell after I pee" x 2 wks. LMP 07/15/20. Last sex 07/16/20 without condom; with current partner x 2 years; 1 partner in last 3 mo. Last MJ 06/2020. Last ETOH 07/15/20 (3 shots liquor) 3-4x/mo. Last cig 5 years ago. Pt in exam room with cough and runny nose and no mask; mask given to pt  Last HIV test per patient/review of record was 03/21/20 Patient reports last pap was 01/02/20 neg  See flowsheet for further details and programmatic requirements.    The following portions of the patient's history were reviewed and updated as appropriate: allergies, current medications, past medical history, past social history, past surgical history and problem list.  Objective:  There were no vitals filed for this visit.  Physical Exam Vitals and nursing note reviewed.  Constitutional:      Appearance: Normal appearance.  HENT:     Head: Normocephalic and atraumatic.     Mouth/Throat:     Mouth: Mucous membranes are moist.     Pharynx: Oropharynx is clear. No oropharyngeal exudate or posterior oropharyngeal erythema.  Eyes:      Conjunctiva/sclera: Conjunctivae normal.  Pulmonary:     Effort: Pulmonary effort is normal.  Chest:  Breasts:    Right: No axillary adenopathy or supraclavicular adenopathy.     Left: No axillary adenopathy or supraclavicular adenopathy.  Abdominal:     Palpations: Abdomen is soft. There is no mass.     Tenderness: There is no abdominal tenderness. There is no rebound.  Genitourinary:    General: Normal vulva.     Exam position: Lithotomy position.     Pubic Area: No rash or pubic lice.      Labia:        Right: No rash or lesion.        Left: No rash or lesion.      Vagina: Normal. No vaginal discharge, erythema, bleeding or lesions.     Cervix: No cervical motion tenderness, discharge, friability, lesion or erythema.     Uterus: Normal.      Adnexa: Right adnexa normal and left adnexa normal.     Rectum: Normal.  Lymphadenopathy:     Head:     Right side of head: No preauricular or posterior auricular adenopathy.     Left side of head: No preauricular or posterior auricular adenopathy.     Cervical: No cervical adenopathy.     Upper Body:     Right upper body: No supraclavicular or axillary adenopathy.     Left upper body: No supraclavicular or axillary adenopathy.     Lower Body: No right inguinal  adenopathy. No left inguinal adenopathy.  Skin:    General: Skin is warm and dry.     Findings: No rash.  Neurological:     Mental Status: She is alert and oriented to person, place, and time.     Assessment and Plan:  Katelyn Mendoza is a 30 y.o. female presenting to the [facility] Department for STI screening  1. Screening examination for venereal disease Treat wet mount per standing orders Immunization nurse consult - WET PREP FOR TRICH, YEAST, CLUE - Chlamydia/Gonorrhea [place] [facility] - HIV [place] [facility] - Syphilis Serology, [place] [facility]     No follow-ups on file.  No future appointments.  Alberteen Spindle, CNM

## 2020-07-23 LAB — HM HIV SCREENING LAB: HM HIV Screening: NEGATIVE

## 2020-07-24 ENCOUNTER — Ambulatory Visit: Payer: Medicaid Other

## 2020-07-24 ENCOUNTER — Telehealth: Payer: Self-pay

## 2020-07-24 ENCOUNTER — Other Ambulatory Visit: Payer: Self-pay

## 2020-07-24 DIAGNOSIS — Z113 Encounter for screening for infections with a predominantly sexual mode of transmission: Secondary | ICD-10-CM

## 2020-07-24 DIAGNOSIS — A549 Gonococcal infection, unspecified: Secondary | ICD-10-CM

## 2020-07-24 MED ORDER — CEFTRIAXONE SODIUM 500 MG IJ SOLR
500.0000 mg | Freq: Once | INTRAMUSCULAR | Status: AC
  Administered 2020-07-24: 500 mg via INTRAMUSCULAR

## 2020-07-24 NOTE — Telephone Encounter (Signed)
Pc to pt to advise of positive test results for gonorrhea. Pt requested to come in ASAP. Added pt to schedule for this afternoon.

## 2020-07-24 NOTE — Progress Notes (Signed)
Here for gonorrhea tx. Treated today per standing order Dr. Ralene Bathe with Ceftriaxone 500mg  IM. Tolerated well. Advised to stay for 20 min observation. Pt stayed for 10 min and said she had to pick up her kids. Denies any problems. Advised to seek immediate medical attn if experiences any signs of anaphylaxis. , RN

## 2020-08-07 ENCOUNTER — Other Ambulatory Visit: Payer: Self-pay

## 2020-08-07 ENCOUNTER — Ambulatory Visit: Payer: Medicaid Other | Admitting: Physician Assistant

## 2020-08-07 DIAGNOSIS — Z113 Encounter for screening for infections with a predominantly sexual mode of transmission: Secondary | ICD-10-CM | POA: Diagnosis not present

## 2020-08-08 NOTE — Progress Notes (Signed)
S:  Patient into clinic requesting retesting for GC.  Per chart review and patient report, patient had positive test for [facility] and was treated on 07/24/2020.  Denies problems with the medicine given and states that she has not had any sexual contact with anyone since prior to her treatment. O:  WDWN female in NAD, A&O x 3, normal work of breathing.  A/P:  1.  Patient was treated for [facility] on 07/24/2020 and is requesting retesting today. 2. Counseled patient that the testing if it was done today would likely be positive still since it has only been about 2 weeks since she was treated. 3.  Counseled patient that with the testing that we use, the recommendation is to retest, at the earliest in 4 weeks for an accurate result. 4.  Counseled that testing too soon after treatment can give false positive results since it takes the body a few weeks to slough off the bacteria. 5.  Offered patient retest today if she still wants to do this but patient opts to RTC in another 1-2 weeks and have retesting done at that time. 6.  Enc condoms with all sex. 7.  RTC prn.

## 2020-08-13 ENCOUNTER — Ambulatory Visit: Payer: Medicaid Other

## 2020-08-15 ENCOUNTER — Encounter: Payer: Self-pay | Admitting: Advanced Practice Midwife

## 2020-08-15 ENCOUNTER — Other Ambulatory Visit: Payer: Self-pay

## 2020-08-15 ENCOUNTER — Ambulatory Visit: Payer: Medicaid Other | Admitting: Advanced Practice Midwife

## 2020-08-15 DIAGNOSIS — Z113 Encounter for screening for infections with a predominantly sexual mode of transmission: Secondary | ICD-10-CM | POA: Diagnosis not present

## 2020-08-15 DIAGNOSIS — A549 Gonococcal infection, unspecified: Secondary | ICD-10-CM

## 2020-08-15 DIAGNOSIS — B9689 Other specified bacterial agents as the cause of diseases classified elsewhere: Secondary | ICD-10-CM

## 2020-08-15 DIAGNOSIS — N760 Acute vaginitis: Secondary | ICD-10-CM

## 2020-08-15 LAB — PREGNANCY, URINE: Preg Test, Ur: NEGATIVE

## 2020-08-15 LAB — WET PREP FOR TRICH, YEAST, CLUE
Trichomonas Exam: NEGATIVE
Yeast Exam: NEGATIVE

## 2020-08-15 MED ORDER — METRONIDAZOLE 500 MG PO TABS: 500.0000 mg | ORAL_TABLET | Freq: Two times a day (BID) | ORAL | 0 refills | Status: AC

## 2020-08-15 NOTE — Progress Notes (Signed)
[facility] Department STI clinic/screening visit  Subjective:  Katelyn Mendoza is a 30 y.o. SBF G3P3 exsmoker female being seen today for an STI screening visit. The patient reports they do not have symptoms.  Patient reports that they  are unsure if  she  desire a pregnancy in the next year.   They reported they are not interested in discussing contraception today.  Patient's last menstrual period was 07/17/2020 (within days).   Patient has the following medical conditions:   Patient Active Problem List   Diagnosis Date Noted   GC (gonococcus infection) 06/06/2020   Status post laparoscopic cholecystectomy 06/22/2019   BMI 30.0-30.9,adult 07/08/2018   Bipolar I disorder (HCC) 07/04/2018   Vulvovaginitis due to yeast 07/04/2018   HSV infection 05/10/2018    Chief Complaint  Patient presents with   SEXUALLY TRANSMITTED DISEASE    Screening    HPI  Patient reports treated 3 wks ago for GC on 07/24/20.  Pt demanding retesting of GC even though counseled by provider this will be an innacurate test and should be done at the earliest 4 weeks after last tx. Last sex 08/09/20 with condom; with current partner x 3 years; 1 partner in last 3 mo. LMP 07/17/20. Last MJ last week. Last ETOH 08/09/20 (3 glasses wine) 1x/wk. Last cig 5 years ago.   Last HIV test per patient/review of record was 07/23/20 Patient reports last pap was 01/02/20 neg  See flowsheet for further details and programmatic requirements.    The following portions of the patient's history were reviewed and updated as appropriate: allergies, current medications, past medical history, past social history, past surgical history and problem list.  Objective:  There were no vitals filed for this visit.  Physical Exam Vitals and nursing note reviewed.  Constitutional:      Appearance: Normal appearance.  HENT:     Head: Normocephalic and atraumatic.     Mouth/Throat:     Mouth: Mucous membranes are moist.      Pharynx: Oropharynx is clear. No oropharyngeal exudate or posterior oropharyngeal erythema.  Eyes:     Conjunctiva/sclera: Conjunctivae normal.  Pulmonary:     Effort: Pulmonary effort is normal.  Chest:  Breasts:    Right: No axillary adenopathy or supraclavicular adenopathy.     Left: No axillary adenopathy or supraclavicular adenopathy.  Abdominal:     Palpations: Abdomen is soft. There is no mass.     Tenderness: There is no abdominal tenderness. There is no rebound.     Comments: Soft without masses or tenderness  Genitourinary:    General: Normal vulva.     Exam position: Lithotomy position.     Pubic Area: No rash or pubic lice.      Labia:        Right: No rash or lesion.        Left: No rash or lesion.      Vagina: Vaginal discharge (grey malodorous leukorrhea, ph>4.5) present. No erythema, bleeding or lesions.     Cervix: Normal.     Uterus: Normal.      Adnexa: Right adnexa normal and left adnexa normal.     Rectum: Normal.  Lymphadenopathy:     Head:     Right side of head: No preauricular or posterior auricular adenopathy.     Left side of head: No preauricular or posterior auricular adenopathy.     Cervical: No cervical adenopathy.     Right cervical: No superficial, deep or posterior  cervical adenopathy.    Left cervical: No superficial, deep or posterior cervical adenopathy.     Upper Body:     Right upper body: No supraclavicular or axillary adenopathy.     Left upper body: No supraclavicular or axillary adenopathy.     Lower Body: No right inguinal adenopathy. No left inguinal adenopathy.  Skin:    General: Skin is warm and dry.     Findings: No rash.  Neurological:     Mental Status: She is alert and oriented to person, place, and time.     Assessment and Plan:  Katelyn Mendoza is a 30 y.o. female presenting to the [facility] Department for STI screening  1. Screening examination for venereal disease Counseled pt that it has only been 3  wks since treated for [facility] and test may be not as accurate as if testing done at 4 wks post treatment; pt insists on testing today and having a PT.  Treat wet mount per standing orders Immunization nurse consult - WET PREP FOR TRICH, YEAST, CLUE - Pregnancy, urine - Chlamydia/Gonorrhea [place] [facility] - Gonococcus culture  2. GC (gonococcus infection) Treated 05/2020 and 07/24/20     No follow-ups on file.  No future appointments.  Alberteen Spindle, CNM

## 2020-08-15 NOTE — Progress Notes (Signed)
Wet mount and UPT reviewed by provider; per verbal order by Arnetha Courser, CNM pt treated for BV per standing order. Provider orders completed.

## 2020-08-20 LAB — GONOCOCCUS CULTURE

## 2020-09-18 ENCOUNTER — Other Ambulatory Visit (HOSPITAL_COMMUNITY)
Admission: RE | Admit: 2020-09-18 | Discharge: 2020-09-18 | Disposition: A | Discharge status: Home / Self Care | Payer: Medicaid Other | Source: Ambulatory Visit | Attending: Obstetrics and Gynecology | Admitting: Obstetrics and Gynecology

## 2020-09-18 ENCOUNTER — Ambulatory Visit (INDEPENDENT_AMBULATORY_CARE_PROVIDER_SITE_OTHER): Payer: Medicaid Other | Admitting: Obstetrics and Gynecology

## 2020-09-18 ENCOUNTER — Encounter: Payer: Self-pay | Admitting: Obstetrics and Gynecology

## 2020-09-18 ENCOUNTER — Other Ambulatory Visit: Payer: Self-pay

## 2020-09-18 VITALS — BP 118/60 | Ht 61.0 in | Wt 161.0 lb

## 2020-09-18 DIAGNOSIS — N760 Acute vaginitis: Secondary | ICD-10-CM | POA: Diagnosis not present

## 2020-09-18 DIAGNOSIS — R399 Unspecified symptoms and signs involving the genitourinary system: Secondary | ICD-10-CM

## 2020-09-18 DIAGNOSIS — Z113 Encounter for screening for infections with a predominantly sexual mode of transmission: Secondary | ICD-10-CM

## 2020-09-18 DIAGNOSIS — B9689 Other specified bacterial agents as the cause of diseases classified elsewhere: Secondary | ICD-10-CM | POA: Diagnosis not present

## 2020-09-18 LAB — POCT URINALYSIS DIPSTICK
Bilirubin, UA: NEGATIVE
Blood, UA: NEGATIVE
Glucose, UA: NEGATIVE
Ketones, UA: NEGATIVE
Leukocytes, UA: NEGATIVE
Nitrite, UA: NEGATIVE
Protein, UA: NEGATIVE
Spec Grav, UA: 1.015 (ref 1.010–1.025)
pH, UA: 6 (ref 5.0–8.0)

## 2020-09-18 LAB — POCT WET PREP WITH KOH
Clue Cells Wet Prep HPF POC: POSITIVE
KOH Prep POC: POSITIVE — AB
Trichomonas, UA: NEGATIVE
Yeast Wet Prep HPF POC: NEGATIVE

## 2020-09-18 MED ORDER — METRONIDAZOLE 0.75 % VA GEL: 1.0000 | Freq: Every day | VAGINAL | 0 refills | Status: AC

## 2020-09-18 NOTE — Progress Notes (Signed)
[employer], Engineer, manufacturing Complaint  Patient presents with   STD testing   Urinary Tract Infection    Frequency and burning urinating x 2 weeks   Vaginal Discharge    Fishy odor, no itchiness or irritation x 2 weeks    HPI:      Katelyn Mendoza is a 30 y.o. [id number] whose LMP was Patient's last menstrual period was 08/28/2020 (approximate)., presents today for STD testing.  Neg gon/chlam/HIV at [facility] 08/15/20, would like testing again. She is having increased vag d/c with odor, mild irritation for 2 wks. No meds to treat, no prior abx use. Hx of BV in past. Also with urinary frequency with good flow, mild dysuria and LBP, no pelvic pain/hematuria/fevers. Drinks lots of caffeine, little water. Neg pap 01/02/20. She is sex active, no pain/bleeding. No new partners.   Past Medical History:  Diagnosis Date   Gallstones    HSV (herpes simplex virus) infection    Mental disorder    bipolar disorder dx'd at age 85   Sickle cell trait (HCC)    Spinal headache    Trichomonosis    Urinary tract infection affecting care of mother, antepartum     Past Surgical History:  Procedure Laterality Date   CHOLECYSTECTOMY  05/2019   spontaneous vaginal deliver     twins 12/20   WISDOM TOOTH EXTRACTION      Family History  Problem Relation Age of Onset   Diabetes Mother    Hypertension Mother    Asthma Mother    Asthma Brother    Colon cancer Maternal Grandfather     Social History   Socioeconomic History   Marital status: Single    Spouse name: Not on file   Number of children: Not on file   Years of education: Not on file   Highest education level: Not on file  Occupational History   Not on file  Tobacco Use   Smoking status: Former    Types: Cigarettes   Smokeless tobacco: Never  Vaping Use   Vaping Use: Never used  Substance and Sexual Activity   Alcohol use: Yes    Alcohol/week: 4.0 standard drinks    Types: 1 Glasses of wine, 3 Shots of liquor per  week    Comment: last use 08/09/20   Drug use: Yes    Types: Marijuana    Comment: last use end of July 2022   Sexual activity: Yes    Partners: Male    Birth control/protection: None  Other Topics Concern   Not on file  Social History Narrative   Not on file   Social Determinants of Health   Financial Resource Strain: Not on file  Food Insecurity: Not on file  Transportation Needs: Not on file  Physical Activity: Not on file  Stress: Not on file  Social Connections: Not on file  Intimate Partner Violence: Not At Risk   Fear of Current or Ex-Partner: No   Emotionally Abused: No   Physically Abused: No   Sexually Abused: No    Outpatient Medications Prior to Visit  Medication Sig Dispense Refill   hydrocortisone 2.5 % cream Apply topically 2 (two) times daily. (Patient not taking: No sig reported) 30 g 0   norgestimate-ethinyl estradiol (ORTHO-CYCLEN) 0.25-35 MG-MCG tablet Take 1 tablet by mouth daily. (Patient not taking: Reported on 08/15/2020) 84 tablet 3   No facility-administered medications prior to visit.    ROS:  Review of  Systems  Constitutional:  Negative for fever.  Gastrointestinal:  Negative for blood in stool, constipation, diarrhea, nausea and vomiting.  Genitourinary:  Positive for frequency and vaginal discharge. Negative for dyspareunia, dysuria, flank pain, hematuria, urgency, vaginal bleeding and vaginal pain.  Musculoskeletal:  Negative for back pain.  Skin:  Negative for rash.  BREAST: No symptoms   OBJECTIVE:   Vitals:  BP 118/60   Ht 5\' 1"  (1.549 m)   Wt 161 lb (73 kg)   LMP 08/28/2020 (Approximate)   Breastfeeding No   BMI 30.42 kg/m   Physical Exam Vitals reviewed.  Constitutional:      Appearance: She is well-developed.  Pulmonary:     Effort: Pulmonary effort is normal.  Genitourinary:    General: Normal vulva.     Pubic Area: No rash.      Labia:        Right: No rash, tenderness or lesion.        Left: No rash, tenderness  or lesion.      Vagina: Vaginal discharge present. No erythema or tenderness.     Cervix: Normal.     Uterus: Normal. Not enlarged and not tender.      Adnexa: Right adnexa normal and left adnexa normal.       Right: No mass or tenderness.         Left: No mass or tenderness.    Musculoskeletal:        General: Normal range of motion.     Cervical back: Normal range of motion.  Skin:    General: Skin is warm and dry.  Neurological:     General: No focal deficit present.     Mental Status: She is alert and oriented to person, place, and time.  Psychiatric:        Mood and Affect: Mood normal.        Behavior: Behavior normal.        Thought Content: Thought content normal.        Judgment: Judgment normal.    Results: Results for orders placed or performed in visit on 09/18/20 (from the past 24 hour(s))  POCT Urinalysis Dipstick     Status: Normal   Collection Time: 09/18/20  2:34 PM  Result Value Ref Range   Color, UA yellow    Clarity, UA clear    Glucose, UA Negative Negative   Bilirubin, UA neg    Ketones, UA neg    Spec Grav, UA 1.015 1.010 - 1.025   Blood, UA neg    pH, UA 6.0 5.0 - 8.0   Protein, UA Negative Negative   Urobilinogen, UA     Nitrite, UA neg    Leukocytes, UA Negative Negative   Appearance     Odor    POCT Wet Prep with KOH     Status: Abnormal   Collection Time: 09/18/20  2:35 PM  Result Value Ref Range   Trichomonas, UA Negative    Clue Cells Wet Prep HPF POC pos    Epithelial Wet Prep HPF POC     Yeast Wet Prep HPF POC neg    Bacteria Wet Prep HPF POC     RBC Wet Prep HPF POC     WBC Wet Prep HPF POC     KOH Prep POC Positive (A) Negative     Assessment/Plan: BV (bacterial vaginosis) - Plan: metroNIDAZOLE (METROGEL) 0.75 % vaginal gel, POCT Wet Prep with KOH; pos sx and wet prep. Rx  metrogel, will RF if sx recur.   UTI symptoms - Plan: POCT Urinalysis Dipstick, Urine Culture; pos sx and neg UA. Check C&S. If neg, most likely due to  caffeine use. D/c and increase water intake.   Screening for STD (sexually transmitted disease) - Plan: Cervicovaginal ancillary only    Meds ordered this encounter  Medications   metroNIDAZOLE (METROGEL) 0.75 % vaginal gel    Sig: Place 1 Applicatorful vaginally at bedtime for 5 days.    Dispense:  50 g    Refill:  0    Order Specific Question:   Supervising Provider    Answer:   Nadara Mustard [[id number]]       Return if symptoms worsen or fail to improve.  Dezmond Downie B. Jessee Newnam, PA-C 09/18/2020 2:37 PM

## 2020-09-20 LAB — CERVICOVAGINAL ANCILLARY ONLY
Chlamydia: NEGATIVE
Comment: NEGATIVE
Comment: NEGATIVE
Comment: NORMAL
Neisseria Gonorrhea: NEGATIVE
Trichomonas: NEGATIVE

## 2020-09-20 LAB — URINE CULTURE

## 2020-10-21 ENCOUNTER — Ambulatory Visit: Payer: Medicaid Other | Admitting: Physician Assistant

## 2020-10-21 ENCOUNTER — Other Ambulatory Visit: Payer: Self-pay

## 2020-10-21 DIAGNOSIS — Z113 Encounter for screening for infections with a predominantly sexual mode of transmission: Secondary | ICD-10-CM

## 2020-10-21 LAB — WET PREP FOR TRICH, YEAST, CLUE
Trichomonas Exam: NEGATIVE
Yeast Exam: NEGATIVE

## 2020-10-21 NOTE — Progress Notes (Signed)
Pt here for STD screening.  Wet mount results reviewed, no treatment required.  Jariyah Hackley M Hayley Horn, RN ? ?

## 2020-10-22 ENCOUNTER — Encounter: Payer: Self-pay | Admitting: Physician Assistant

## 2020-10-22 NOTE — Progress Notes (Signed)
[facility] Department STI clinic/screening visit  Subjective:  Katelyn Mendoza is a 30 y.o. female being seen today for an STI screening visit. The patient reports they do have symptoms.  Patient reports that they do not desire a pregnancy in the next year.   They reported they are not interested in discussing contraception today.  Patient's last menstrual period was 09/29/2020 (exact date).   Patient has the following medical conditions:   Patient Active Problem List   Diagnosis Date Noted   BV (bacterial vaginosis) 09/18/2020   GC (gonococcus infection) 06/06/2020   Status post laparoscopic cholecystectomy 06/22/2019   BMI 30.0-30.9,adult 07/08/2018   Bipolar I disorder [facility]) dx'd age 15 07/04/2018   Vulvovaginitis due to yeast 07/04/2018   HSV infection 05/10/2018    Chief Complaint  Patient presents with   SEXUALLY TRANSMITTED DISEASE    SCREENING    HPI  Patient reports that she has had a "creamy" discharge for 2 weeks.  States that she is not having any other symptoms but wants to be sure everything is OK.  Reports that she has d/c hormonal BCM and wonders if the discharge is her body "getting back to normal".  Denies chronic conditions and regular medicines.  States last HIV test was in July of this year and last pap was 12/2019.  LMP 09/29/2020 and is using condoms as her current BCM.  See flowsheet for further details and programmatic requirements.    The following portions of the patient's history were reviewed and updated as appropriate: allergies, current medications, past medical history, past social history, past surgical history and problem list.  Objective:  There were no vitals filed for this visit.  Physical Exam Constitutional:      General: She is not in acute distress.    Appearance: Normal appearance.  HENT:     Head: Normocephalic and atraumatic.     Comments: No nits,lice, or hair loss. No cervical, supraclavicular or axillary  adenopathy.     Mouth/Throat:     Mouth: Mucous membranes are moist.     Pharynx: Oropharynx is clear. No oropharyngeal exudate or posterior oropharyngeal erythema.  Eyes:     Conjunctiva/sclera: Conjunctivae normal.  Pulmonary:     Effort: Pulmonary effort is normal.  Abdominal:     Palpations: Abdomen is soft. There is no mass.     Tenderness: There is no abdominal tenderness. There is no guarding or rebound.  Genitourinary:    General: Normal vulva.     Rectum: Normal.     Comments: External genitalia/pubic area without nits, lice, edema, erythema, lesions and inguinal adenopathy. Vagina with normal mucosa and discharge. Cervix without visible lesions. Uterus firm, mobile, nt, no masses, no CMT, no adnexal tenderness or fullness.  Musculoskeletal:     Cervical back: Neck supple. No tenderness.  Skin:    General: Skin is warm and dry.     Findings: No bruising, erythema, lesion or rash.  Neurological:     Mental Status: She is alert and oriented to person, place, and time.  Psychiatric:        Mood and Affect: Mood normal.        Behavior: Behavior normal.        Thought Content: Thought content normal.        Judgment: Judgment normal.     Assessment and Plan:  Katelyn Mendoza is a 30 y.o. female presenting to the [facility] Department for STI screening  1. Screening  for STD (sexually transmitted disease) Patient into clinic with symptoms. Reviewed wet mount results.  No treatment indicated today. Rec condoms with all sex. Await test results.  Counseled that RN will call if needs to RTC for treatment once results are back.  - WET PREP FOR TRICH, YEAST, CLUE - Gonococcus culture - Chlamydia/Gonorrhea [place] [facility] - HIV [place] [facility] - Syphilis Serology, [place] [facility]     No follow-ups on file.  No future appointments.  Matt Holmes, PA

## 2020-10-25 LAB — GONOCOCCUS CULTURE

## 2020-12-09 ENCOUNTER — Other Ambulatory Visit: Payer: Self-pay

## 2020-12-09 ENCOUNTER — Ambulatory Visit: Payer: Medicaid Other | Admitting: Obstetrics and Gynecology

## 2020-12-09 ENCOUNTER — Encounter: Payer: Self-pay | Admitting: Obstetrics and Gynecology

## 2020-12-09 ENCOUNTER — Other Ambulatory Visit (HOSPITAL_COMMUNITY)
Admission: RE | Admit: 2020-12-09 | Discharge: 2020-12-09 | Disposition: A | Discharge status: Home / Self Care | Payer: Medicaid Other | Source: Ambulatory Visit | Attending: Obstetrics and Gynecology | Admitting: Obstetrics and Gynecology

## 2020-12-09 VITALS — BP 90/60 | Ht 61.0 in | Wt 165.0 lb

## 2020-12-09 DIAGNOSIS — Z113 Encounter for screening for infections with a predominantly sexual mode of transmission: Secondary | ICD-10-CM | POA: Diagnosis present

## 2020-12-09 DIAGNOSIS — B9689 Other specified bacterial agents as the cause of diseases classified elsewhere: Secondary | ICD-10-CM | POA: Diagnosis not present

## 2020-12-09 DIAGNOSIS — N760 Acute vaginitis: Secondary | ICD-10-CM | POA: Diagnosis not present

## 2020-12-09 LAB — POCT WET PREP WITH KOH
Clue Cells Wet Prep HPF POC: POSITIVE
KOH Prep POC: POSITIVE — AB
Trichomonas, UA: NEGATIVE
Yeast Wet Prep HPF POC: NEGATIVE

## 2020-12-09 MED ORDER — METRONIDAZOLE 0.75 % VA GEL: 1.0000 | Freq: Every day | VAGINAL | 0 refills | Status: AC

## 2020-12-09 NOTE — Progress Notes (Signed)
[employer], Engineer, manufacturing Complaint  Patient presents with   STD testing    HPI:      Katelyn Mendoza is a 30 y.o. [id number] whose LMP was Patient's last menstrual period was 11/20/2020 (exact date)., presents today for STD testing. Does almost monthly between here and [facility]. Neg 10/22 at [facility]. Has increased vag d/c, no odor/irritation, but had same sx 9/22 and diagnosed with BV, treated with flagyl with sx relief. No new sex partners. Uses condoms sometimes. Uses dove sens skin soap, no dryer sheets, uses cotton, regular underwear, no thongs/panytliners. No LBP, pelvic pain, fevers. No urin sx. Declines BC.  Hx of trich and HSV  Patient Active Problem List   Diagnosis Date Noted   BV (bacterial vaginosis) 09/18/2020   GC (gonococcus infection) 06/06/2020   Status post laparoscopic cholecystectomy 06/22/2019   BMI 30.0-30.9,adult 07/08/2018   Bipolar I disorder (H[facility]) dx'd age 25 07/04/2018   Vulvovaginitis due to yeast 07/04/2018   HSV infection 05/10/2018    Past Surgical History:  Procedure Laterality Date   CHOLECYSTECTOMY  05/2019   spontaneous vaginal deliver     twins 12/20   WISDOM TOOTH EXTRACTION      Family History  Problem Relation Age of Onset   Diabetes Mother    Hypertension Mother    Asthma Mother    Asthma Brother    Colon cancer Maternal Grandfather     Social History   Socioeconomic History   Marital status: Single    Spouse name: Not on file   Number of children: Not on file   Years of education: Not on file   Highest education level: Not on file  Occupational History   Not on file  Tobacco Use   Smoking status: Former    Types: Cigarettes   Smokeless tobacco: Never  Vaping Use   Vaping Use: Never used  Substance and Sexual Activity   Alcohol use: Not Currently    Alcohol/week: 4.0 standard drinks    Types: 1 Glasses of wine, 3 Shots of liquor per week    Comment: last use 08/09/20   Drug use: Not Currently    Types:  Marijuana    Comment: last use end of July 2022   Sexual activity: Yes    Partners: Male    Birth control/protection: None, Condom  Other Topics Concern   Not on file  Social History Narrative   Not on file   Social Determinants of Health   Financial Resource Strain: Not on file  Food Insecurity: Not on file  Transportation Needs: Not on file  Physical Activity: Not on file  Stress: Not on file  Social Connections: Not on file  Intimate Partner Violence: Not At Risk   Fear of Current or Ex-Partner: No   Emotionally Abused: No   Physically Abused: No   Sexually Abused: No    No outpatient medications prior to visit.   No facility-administered medications prior to visit.      ROS:  Review of Systems  Constitutional:  Negative for fever.  Gastrointestinal:  Negative for blood in stool, constipation, diarrhea, nausea and vomiting.  Genitourinary:  Positive for vaginal discharge. Negative for dyspareunia, dysuria, flank pain, frequency, hematuria, urgency, vaginal bleeding and vaginal pain.  Musculoskeletal:  Negative for back pain.  Skin:  Negative for rash.  BREAST: No symptoms   OBJECTIVE:   Vitals:  BP 90/60   Ht 5\' 1"  (1.549 m)  Wt 165 lb (74.8 kg)   LMP 11/20/2020 (Exact Date)   Breastfeeding No   BMI 31.18 kg/m   Physical Exam Vitals reviewed.  Constitutional:      Appearance: She is well-developed.  Pulmonary:     Effort: Pulmonary effort is normal.  Genitourinary:    General: Normal vulva.     Pubic Area: No rash.      Labia:        Right: No rash, tenderness or lesion.        Left: No rash, tenderness or lesion.      Vagina: Normal. No vaginal discharge, erythema or tenderness.     Cervix: Normal.     Uterus: Normal. Not enlarged and not tender.      Adnexa: Right adnexa normal and left adnexa normal.       Right: No mass or tenderness.         Left: No mass or tenderness.    Musculoskeletal:        General: Normal range of motion.      Cervical back: Normal range of motion.  Skin:    General: Skin is warm and dry.  Neurological:     General: No focal deficit present.     Mental Status: She is alert and oriented to person, place, and time.  Psychiatric:        Mood and Affect: Mood normal.        Behavior: Behavior normal.        Thought Content: Thought content normal.        Judgment: Judgment normal.    Results: Results for orders placed or performed in visit on 12/09/20 (from the past 24 hour(s))  POCT Wet Prep with KOH     Status: Abnormal   Collection Time: 12/09/20 10:43 AM  Result Value Ref Range   Trichomonas, UA Negative    Clue Cells Wet Prep HPF POC pos    Epithelial Wet Prep HPF POC     Yeast Wet Prep HPF POC neg    Bacteria Wet Prep HPF POC     RBC Wet Prep HPF POC     WBC Wet Prep HPF POC     KOH Prep POC Positive (A) Negative     Assessment/Plan: Screening for STD (sexually transmitted disease) - Plan: Cervicovaginal ancillary only  BV (bacterial vaginosis) - Plan: metroNIDAZOLE (METROGEL) 0.75 % vaginal gel, POCT Wet Prep with KOH; pos sx and wet prep. Rx metrogel. Add boric acid supp. F/u prn.    Meds ordered this encounter  Medications   metroNIDAZOLE (METROGEL) 0.75 % vaginal gel    Sig: Place 1 Applicatorful vaginally at bedtime for 5 days.    Dispense:  50 g    Refill:  0    Order Specific Question:   Supervising Provider    Answer:   Katelyn Mendoza      Return if symptoms worsen or fail to improve.  Tricia Pledger B. Keziah Avis, PA-C 12/09/2020 10:44 AM

## 2020-12-10 LAB — CERVICOVAGINAL ANCILLARY ONLY
Chlamydia: NEGATIVE
Comment: NEGATIVE
Comment: NEGATIVE
Comment: NORMAL
Neisseria Gonorrhea: NEGATIVE
Trichomonas: NEGATIVE

## 2021-02-01 ENCOUNTER — Emergency Department: Payer: Medicaid Other

## 2021-02-01 ENCOUNTER — Other Ambulatory Visit: Payer: Self-pay

## 2021-02-01 ENCOUNTER — Emergency Department
Admission: EM | Admit: 2021-02-01 | Discharge: 2021-02-01 | Discharge status: Against Medical Advice | Payer: Medicaid Other | Attending: Emergency Medicine | Admitting: Emergency Medicine

## 2021-02-01 DIAGNOSIS — R079 Chest pain, unspecified: Secondary | ICD-10-CM | POA: Insufficient documentation

## 2021-02-01 DIAGNOSIS — Z5321 Procedure and treatment not carried out due to patient leaving prior to being seen by health care provider: Secondary | ICD-10-CM | POA: Insufficient documentation

## 2021-02-01 DIAGNOSIS — R109 Unspecified abdominal pain: Secondary | ICD-10-CM | POA: Diagnosis not present

## 2021-02-01 DIAGNOSIS — R1110 Vomiting, unspecified: Secondary | ICD-10-CM | POA: Diagnosis present

## 2021-02-01 LAB — CBC
HCT: 44.4 % (ref 36.0–46.0)
Hemoglobin: 15.1 g/dL — ABNORMAL HIGH (ref 12.0–15.0)
MCH: 27.2 pg (ref 26.0–34.0)
MCHC: 34 g/dL (ref 30.0–36.0)
MCV: 79.9 fL — ABNORMAL LOW (ref 80.0–100.0)
Platelets: 420 10*3/uL — ABNORMAL HIGH (ref 150–400)
RBC: 5.56 MIL/uL — ABNORMAL HIGH (ref 3.87–5.11)
RDW: 13.1 % (ref 11.5–15.5)
WBC: 8.6 10*3/uL (ref 4.0–10.5)
nRBC: 0 % (ref 0.0–0.2)

## 2021-02-01 LAB — COMPREHENSIVE METABOLIC PANEL
ALT: 20 U/L (ref 0–44)
AST: 22 U/L (ref 15–41)
Albumin: 4.9 g/dL (ref 3.5–5.0)
Alkaline Phosphatase: 70 U/L (ref 38–126)
Anion gap: 17 — ABNORMAL HIGH (ref 5–15)
BUN: 15 mg/dL (ref 6–20)
CO2: 22 mmol/L (ref 22–32)
Calcium: 9.5 mg/dL (ref 8.9–10.3)
Chloride: 103 mmol/L (ref 98–111)
Creatinine, Ser: 0.55 mg/dL (ref 0.44–1.00)
GFR, Estimated: >60 mL/min (ref >60)
Glucose, Bld: 120 mg/dL — ABNORMAL HIGH (ref 70–99)
Potassium: 3.7 mmol/L (ref 3.5–5.1)
Sodium: 142 mmol/L (ref 135–145)
Total Bilirubin: 0.9 mg/dL (ref 0.3–1.2)
Total Protein: 8.9 g/dL — ABNORMAL HIGH (ref 6.5–8.1)

## 2021-02-01 LAB — POC URINE PREG, ED: Preg Test, Ur: NEGATIVE

## 2021-02-01 LAB — TROPONIN I (HIGH SENSITIVITY): Troponin I (High Sensitivity): <2 ng/L (ref <18)

## 2021-02-01 MED ORDER — ONDANSETRON 4 MG PO TBDP
4.0000 mg | ORAL_TABLET | Freq: Once | ORAL | Status: AC | PRN
  Administered 2021-02-01: 4 mg via ORAL
  Filled 2021-02-01: qty 1

## 2021-02-01 NOTE — ED Triage Notes (Signed)
Pt states vomiting, abd pain, ches tpain that began at 0200 this am.

## 2021-02-07 ENCOUNTER — Ambulatory Visit: Payer: Medicaid Other | Admitting: Family Medicine

## 2021-02-07 ENCOUNTER — Other Ambulatory Visit: Payer: Self-pay

## 2021-02-07 ENCOUNTER — Encounter: Payer: Self-pay | Admitting: Family Medicine

## 2021-02-07 DIAGNOSIS — Z113 Encounter for screening for infections with a predominantly sexual mode of transmission: Secondary | ICD-10-CM | POA: Diagnosis not present

## 2021-02-07 DIAGNOSIS — A599 Trichomoniasis, unspecified: Secondary | ICD-10-CM

## 2021-02-07 DIAGNOSIS — Z3200 Encounter for pregnancy test, result unknown: Secondary | ICD-10-CM

## 2021-02-07 LAB — WET PREP FOR TRICH, YEAST, CLUE
Trichomonas Exam: POSITIVE — AB
Yeast Exam: NEGATIVE

## 2021-02-07 LAB — PREGNANCY, URINE: Preg Test, Ur: NEGATIVE

## 2021-02-07 MED ORDER — METRONIDAZOLE 500 MG PO TABS: 500.0000 mg | ORAL_TABLET | Freq: Two times a day (BID) | ORAL | 0 refills | Status: AC

## 2021-02-07 NOTE — Progress Notes (Signed)
[facility] Department  STI clinic/screening visit [place] [address] [phone]  Subjective:  Katelyn Mendoza is a 31 y.o. female being seen today for an STI screening visit. The patient reports they do have symptoms.  Patient reports that they do not desire a pregnancy in the next year.   They reported they are not interested in discussing contraception today.    Patient's last menstrual period was 01/17/2021 (exact date).   Patient has the following medical conditions:   Patient Active Problem List   Diagnosis Date Noted   BV (bacterial vaginosis) 09/18/2020   GC (gonococcus infection) 06/06/2020   Status post laparoscopic cholecystectomy 06/22/2019   BMI 30.0-30.9,adult 07/08/2018   Bipolar I disorder [facility]) dx'd age 26 07/04/2018   Vulvovaginitis due to yeast 07/04/2018   HSV infection 05/10/2018    Chief Complaint  Patient presents with   SEXUALLY TRANSMITTED DISEASE    HPI  Patient reports here for screening, "I think I have BV"   Last HIV test per patient/review of record was 10/21/2020 Patient reports last pap was 12/2019.   Screening for MPX risk: Does the patient have an unexplained rash? No Is the patient MSM? No Does the patient endorse multiple sex partners or anonymous sex partners? No Did the patient have close or sexual contact with a person diagnosed with MPX? No Has the patient traveled outside the [place] where MPX is endemic? No Is there a high clinical suspicion for MPX-- evidenced by one of the following No  -Unlikely to be chickenpox  -Lymphadenopathy  -Rash that present in same phase of evolution on any given body part See flowsheet for further details and programmatic requirements.    The following portions of the patient's history were reviewed and updated as appropriate: allergies, current medications, past medical history, past social history, past surgical history and problem list.  Objective:  There  were no vitals filed for this visit.  Physical Exam Vitals and nursing note reviewed.  Constitutional:      Appearance: Normal appearance.  HENT:     Head: Normocephalic and atraumatic.     Mouth/Throat:     Mouth: Mucous membranes are moist.     Pharynx: Oropharynx is clear. No oropharyngeal exudate or posterior oropharyngeal erythema.  Pulmonary:     Effort: Pulmonary effort is normal.  Abdominal:     General: Abdomen is flat.     Palpations: There is no mass.     Tenderness: There is no abdominal tenderness. There is no rebound.  Genitourinary:    General: Normal vulva.     Exam position: Lithotomy position.     Pubic Area: No rash or pubic lice.      Labia:        Right: No rash or lesion.        Left: No rash or lesion.      Vagina: Normal. No vaginal discharge, erythema, bleeding or lesions.     Cervix: No cervical motion tenderness, discharge, friability, lesion or erythema.     Uterus: Normal.      Adnexa: Right adnexa normal and left adnexa normal.     Rectum: Normal.     Comments: External genitalia without, lice, nits, erythema, edema, lesions or inguinal adenopathy. Vagina with normal mucosa and thick white discharge and pH > 4.  Cervix without visual lesions, uterus firm, mobile, non-tender, no masses, CMT adnexal fullness or tenderness.   Lymphadenopathy:     Head:  Right side of head: No preauricular or posterior auricular adenopathy.     Left side of head: No preauricular or posterior auricular adenopathy.     Cervical: No cervical adenopathy.     Upper Body:     Right upper body: No supraclavicular or axillary adenopathy.     Left upper body: No supraclavicular or axillary adenopathy.     Lower Body: No right inguinal adenopathy. No left inguinal adenopathy.  Skin:    General: Skin is warm and dry.     Findings: No rash.  Neurological:     Mental Status: She is alert and oriented to person, place, and time.  Psychiatric:        Behavior: Behavior  normal.     Assessment and Plan:  Katelyn Mendoza is a 31 y.o. female presenting to the [facility] Department for STI screening  1. Screening examination for venereal disease Patient accepted all screenings including wet prep, oral, vaginal CT/GC and bloodwork for HIV/RPR.  Patient meets criteria for HepB screening? No. Ordered? No - does not meet criteria  Patient meets criteria for HepC screening? No. Ordered? No - does not meet criteria   Wet prep results + trich    Treatment needed for trich  Discussed time line for [employer] results and that patient will be called with positive results and encouraged patient to call if she had not heard in 2 weeks.  Counseled to return or seek care for continued or worsening symptoms Recommended condom use with all sex  Patient is currently using  no BCM   to prevent pregnancy.  - [place], YEAST, CLUE - [facility] - Syphilis Serology, [facility] - Chlamydia/Gonorrhea [facility]  2. Encounter for pregnancy test, result unknown Pt desires PT reports that LMP was abnormal it was shorter than normal.   - Pregnancy, urine  3. Trichomonosis   - metroNIDAZOLE (FLAGYL) 500 MG tablet; Take 1 tablet (500 mg total) by mouth 2 (two) times daily for 7 days.  Dispense: 14 tablet; Refill: 0   No follow-ups on file.  No future appointments.  Junious Dresser, FNP

## 2021-02-22 ENCOUNTER — Emergency Department
Admission: EM | Admit: 2021-02-22 | Discharge: 2021-02-22 | Disposition: A | Discharge status: Home / Self Care | Payer: Medicaid Other | Attending: Emergency Medicine | Admitting: Emergency Medicine

## 2021-02-22 ENCOUNTER — Encounter: Payer: Self-pay | Admitting: Emergency Medicine

## 2021-02-22 ENCOUNTER — Other Ambulatory Visit: Payer: Self-pay

## 2021-02-22 DIAGNOSIS — Z3201 Encounter for pregnancy test, result positive: Secondary | ICD-10-CM | POA: Insufficient documentation

## 2021-02-22 DIAGNOSIS — R112 Nausea with vomiting, unspecified: Secondary | ICD-10-CM | POA: Diagnosis not present

## 2021-02-22 DIAGNOSIS — Z5321 Procedure and treatment not carried out due to patient leaving prior to being seen by health care provider: Secondary | ICD-10-CM | POA: Diagnosis not present

## 2021-02-22 LAB — LIPASE, BLOOD: Lipase: 47 U/L (ref 11–51)

## 2021-02-22 LAB — CBC
HCT: 42.2 % (ref 36.0–46.0)
Hemoglobin: 14 g/dL (ref 12.0–15.0)
MCH: 26.8 pg (ref 26.0–34.0)
MCHC: 33.2 g/dL (ref 30.0–36.0)
MCV: 80.7 fL (ref 80.0–100.0)
Platelets: 372 10*3/uL (ref 150–400)
RBC: 5.23 MIL/uL — ABNORMAL HIGH (ref 3.87–5.11)
RDW: 13.1 % (ref 11.5–15.5)
WBC: 5.9 10*3/uL (ref 4.0–10.5)
nRBC: 0 % (ref 0.0–0.2)

## 2021-02-22 LAB — COMPREHENSIVE METABOLIC PANEL
ALT: 18 U/L (ref 0–44)
AST: 19 U/L (ref 15–41)
Albumin: 4.4 g/dL (ref 3.5–5.0)
Alkaline Phosphatase: 58 U/L (ref 38–126)
Anion gap: 12 (ref 5–15)
BUN: 12 mg/dL (ref 6–20)
CO2: 23 mmol/L (ref 22–32)
Calcium: 9.1 mg/dL (ref 8.9–10.3)
Chloride: 106 mmol/L (ref 98–111)
Creatinine, Ser: 0.64 mg/dL (ref 0.44–1.00)
GFR, Estimated: >60 mL/min (ref >60)
Glucose, Bld: 118 mg/dL — ABNORMAL HIGH (ref 70–99)
Potassium: 3.4 mmol/L — ABNORMAL LOW (ref 3.5–5.1)
Sodium: 141 mmol/L (ref 135–145)
Total Bilirubin: 0.8 mg/dL (ref 0.3–1.2)
Total Protein: 8 g/dL (ref 6.5–8.1)

## 2021-02-22 NOTE — ED Triage Notes (Signed)
Pt to ED from home c/o n/v for a couple weeks.  States woke up today and took a pregnancy test at home that was positive.  States has irregular periods and last was in January 6th.  G3P3.

## 2021-02-24 ENCOUNTER — Other Ambulatory Visit (HOSPITAL_COMMUNITY)
Admission: RE | Admit: 2021-02-24 | Discharge: 2021-02-24 | Disposition: A | Discharge status: Home / Self Care | Payer: Medicaid Other | Source: Ambulatory Visit | Attending: Obstetrics and Gynecology | Admitting: Obstetrics and Gynecology

## 2021-02-24 ENCOUNTER — Encounter: Payer: Self-pay | Admitting: Obstetrics and Gynecology

## 2021-02-24 ENCOUNTER — Other Ambulatory Visit: Payer: Self-pay

## 2021-02-24 ENCOUNTER — Ambulatory Visit: Payer: Medicaid Other | Admitting: Obstetrics and Gynecology

## 2021-02-24 VITALS — BP 100/64 | Ht 61.0 in | Wt 172.0 lb

## 2021-02-24 DIAGNOSIS — N926 Irregular menstruation, unspecified: Secondary | ICD-10-CM

## 2021-02-24 DIAGNOSIS — Z113 Encounter for screening for infections with a predominantly sexual mode of transmission: Secondary | ICD-10-CM

## 2021-02-24 LAB — POCT URINE PREGNANCY: Preg Test, Ur: NEGATIVE

## 2021-02-24 NOTE — Progress Notes (Signed)
[employer], Nurse, learning disability Complaint  Patient presents with   STD testing    Would like UPT, had pos UPT last fri then neg upt the next day. Pt started bleeding last night    HPI:      [name] is a 31 y.o. [phone] whose LMP was Patient's last menstrual period was 01/17/2021 (exact date)., presents today for STD testing. No vag sx, no known exposures. Just wants to be safe. She is sex active, no new partners. Neg STD testing 11/22 Neg pap 12/21. Hx of trich 12/21; gon in past.  Menses are usually Q3 1/2 wks, lasting 4 days, no BTB, mild dysmen. LMP was late; had pos UPT a few days ago with neg UPT next day. Menses started today.   Patient Active Problem List   Diagnosis Date Noted   BV (bacterial vaginosis) 09/18/2020   GC (gonococcus infection) 06/06/2020   Status post laparoscopic cholecystectomy 06/22/2019   BMI 30.0-30.9,adult 07/08/2018   Bipolar I disorder ([place]) dx'd age 20 07/04/2018   Vulvovaginitis due to yeast 07/04/2018   HSV infection 05/10/2018    Past Surgical History:  Procedure Laterality Date   CHOLECYSTECTOMY  05/2019   spontaneous vaginal deliver     twins 12/20   WISDOM TOOTH EXTRACTION      Family History  Problem Relation Age of Onset   Diabetes Mother    Hypertension Mother    Asthma Mother    Asthma Brother    Colon cancer Maternal Grandfather     Social History   Socioeconomic History   Marital status: Single    Spouse name: Not on file   Number of children: Not on file   Years of education: Not on file   Highest education level: Not on file  Occupational History   Not on file  Tobacco Use   Smoking status: Former    Types: Cigarettes    Quit date: 02/07/2013    Years since quitting: 8.0   Smokeless tobacco: Never  Vaping Use   Vaping Use: Never used  Substance and Sexual Activity   Alcohol use: Not Currently    Alcohol/week: 4.0 standard drinks    Types: 1 Glasses of wine, 3 Shots of liquor per week     Comment: ocassionally   Drug use: Not Currently    Types: Marijuana    Comment: last use end of July 2022   Sexual activity: Yes    Partners: Male    Birth control/protection: None, Condom  Other Topics Concern   Not on file  Social History Narrative   Not on file   Social Determinants of Health   Financial Resource Strain: Not on file  Food Insecurity: Not on file  Transportation Needs: Not on file  Physical Activity: Not on file  Stress: Not on file  Social Connections: Not on file  Intimate Partner Violence: Not At Risk   Fear of Current or Ex-Partner: No   Emotionally Abused: No   Physically Abused: No   Sexually Abused: No    No outpatient medications prior to visit.   No facility-administered medications prior to visit.      ROS:  Review of Systems  Constitutional:  Negative for fever.  Gastrointestinal:  Negative for blood in stool, constipation, diarrhea, nausea and vomiting.  Genitourinary:  Negative for dyspareunia, dysuria, flank pain, frequency, hematuria, urgency, vaginal bleeding, vaginal discharge and vaginal pain.  Musculoskeletal:  Negative for back pain.  Skin:  Negative for rash.  BREAST: No symptoms   OBJECTIVE:   Vitals:  BP 100/64    Ht 5\' 1"  (1.549 m)    Wt 172 lb (78 kg)    LMP 01/17/2021 (Exact Date)    Breastfeeding No    BMI 32.50 kg/m   Physical Exam Vitals reviewed.  Constitutional:      Appearance: She is well-developed.  Pulmonary:     Effort: Pulmonary effort is normal.  Genitourinary:    General: Normal vulva.     Pubic Area: No rash.      Labia:        Right: No rash, tenderness or lesion.        Left: No rash, tenderness or lesion.      Vagina: Bleeding present. No vaginal discharge, erythema or tenderness.     Cervix: Normal.     Uterus: Normal. Not enlarged and not tender.      Adnexa: Right adnexa normal and left adnexa normal.       Right: No mass or tenderness.         Left: No mass or tenderness.     Musculoskeletal:        General: Normal range of motion.     Cervical back: Normal range of motion.  Skin:    General: Skin is warm and dry.  Neurological:     General: No focal deficit present.     Mental Status: She is alert and oriented to person, place, and time.  Psychiatric:        Mood and Affect: Mood normal.        Behavior: Behavior normal.        Thought Content: Thought content normal.        Judgment: Judgment normal.    Results: Results for orders placed or performed in visit on 02/24/21 (from the past 24 hour(s))  POCT urine pregnancy     Status: Normal   Collection Time: 02/24/21  3:23 PM  Result Value Ref Range   Preg Test, Ur Negative Negative     Assessment/Plan: Screening for STD (sexually transmitted disease) - Plan: Cervicovaginal ancillary only  Late menses - Plan: POCT urine pregnancy; neg UPT today. Menses started now. Reassurance. F/u prn.     Return if symptoms worsen or fail to improve.  Andy Moye B. Xochilt Conant, PA-C 02/24/2021 3:32 PM

## 2021-02-26 LAB — CERVICOVAGINAL ANCILLARY ONLY
Chlamydia: NEGATIVE
Comment: NEGATIVE
Comment: NEGATIVE
Comment: NORMAL
Neisseria Gonorrhea: NEGATIVE
Trichomonas: NEGATIVE

## 2021-03-04 ENCOUNTER — Telehealth: Payer: Self-pay | Admitting: Family Medicine

## 2021-03-04 NOTE — Telephone Encounter (Signed)
Patient wants blood work done. She states that she took 2 pregnancy tests and 1 came back positive and another negative and then was 1 week late for her menstrual cycle.

## 2021-03-10 ENCOUNTER — Ambulatory Visit: Payer: Medicaid Other | Admitting: Family Medicine

## 2021-03-10 ENCOUNTER — Other Ambulatory Visit: Payer: Self-pay

## 2021-03-10 DIAGNOSIS — Z5321 Procedure and treatment not carried out due to patient leaving prior to being seen by health care provider: Secondary | ICD-10-CM

## 2021-03-10 NOTE — Progress Notes (Signed)
Pt unable to stay for appointment.  Not seen by provider.    Pt rescheduled.    Wendi Snipes, FNP

## 2021-03-12 ENCOUNTER — Other Ambulatory Visit: Payer: Self-pay

## 2021-03-12 ENCOUNTER — Encounter: Payer: Self-pay | Admitting: Family Medicine

## 2021-03-12 ENCOUNTER — Ambulatory Visit: Payer: Medicaid Other | Admitting: Family Medicine

## 2021-03-12 ENCOUNTER — Ambulatory Visit: Payer: Medicaid Other

## 2021-03-12 DIAGNOSIS — Z113 Encounter for screening for infections with a predominantly sexual mode of transmission: Secondary | ICD-10-CM | POA: Diagnosis not present

## 2021-03-12 LAB — WET PREP FOR TRICH, YEAST, CLUE
Trichomonas Exam: NEGATIVE
Yeast Exam: NEGATIVE

## 2021-03-12 NOTE — Progress Notes (Signed)
Here today for STD screening. Declines bloodwork. Calandra Madura, RN ? ?

## 2021-03-12 NOTE — Progress Notes (Signed)
[facility] Department ? ?STI clinic/screening visit ?[place] [address] ?[phone] ? ?Subjective:  ?VIOLETT OHAYON is a 31 y.o. female being seen today for an STI screening visit. The patient reports they do not have symptoms.  Patient reports that they do not desire a pregnancy in the next year.   They reported they are not interested in discussing contraception today.   ? ?Patient's last menstrual period was 02/23/2021 (exact date). ? ? ?Patient has the following medical conditions:   ?Patient Active Problem List  ? Diagnosis Date Noted  ? BV (bacterial vaginosis) 09/18/2020  ? GC (gonococcus infection) 06/06/2020  ? Status post laparoscopic cholecystectomy 06/22/2019  ? BMI 30.0-30.9,adult 07/08/2018  ? Bipolar I disorder [facility]) dx'd age 9 07/04/2018  ? Vulvovaginitis due to yeast 07/04/2018  ? HSV infection 05/10/2018  ? ? ?Chief Complaint  ?Patient presents with  ? SEXUALLY TRANSMITTED DISEASE  ?  Screening  ? ? ?HPI ? ?Patient reports here ? ?Last HIV test per patient/review of record was 02/07/2021 ?Patient reports last pap was 12/2019.  ? ?Screening for MPX risk: ?Does the patient have an unexplained rash? No ?Is the patient MSM? No ?Does the patient endorse multiple sex partners or anonymous sex partners? No ?Did the patient have close or sexual contact with a person diagnosed with MPX? No ?Has the patient traveled outside the [place] where MPX is endemic? No ?Is there a high clinical suspicion for MPX-- evidenced by one of the following No ? -Unlikely to be chickenpox ? -Lymphadenopathy ? -Rash that present in same phase of evolution on any given body part ?See flowsheet for further details and programmatic requirements.  ? ? ?The following portions of the patient's history were reviewed and updated as appropriate: allergies, current medications, past medical history, past social history, past surgical history and problem list. ? ?Objective:  ?There were no vitals  filed for this visit. ? ?Physical Exam ?Vitals and nursing note reviewed.  ?Constitutional:   ?   Appearance: Normal appearance.  ?HENT:  ?   Head: Normocephalic and atraumatic.  ?   Mouth/Throat:  ?   Mouth: Mucous membranes are moist.  ?   Pharynx: Oropharynx is clear. No oropharyngeal exudate or posterior oropharyngeal erythema.  ?Pulmonary:  ?   Effort: Pulmonary effort is normal.  ?Abdominal:  ?   General: Abdomen is flat.  ?   Palpations: There is no mass.  ?   Tenderness: There is no abdominal tenderness. There is no rebound.  ?Genitourinary: ?   General: Normal vulva.  ?   Exam position: Lithotomy position.  ?   Pubic Area: No rash or pubic lice.   ?   Labia:     ?   Right: No rash or lesion.     ?   Left: No rash or lesion.   ?   Vagina: Normal. No vaginal discharge, erythema, bleeding or lesions.  ?   Cervix: No cervical motion tenderness, discharge, friability, lesion or erythema.  ?   Uterus: Normal.   ?   Adnexa: Right adnexa normal and left adnexa normal.  ?   Rectum: Normal.  ?   Comments: External genitalia without, lice, nits, erythema, edema , lesions or inguinal adenopathy. Vagina with normal mucosa and discharge and pH equals 4.  Cervix without visual lesions, uterus firm, mobile, non-tender, no masses, CMT adnexal fullness or tenderness.   ?Lymphadenopathy:  ?   Head:  ?   Right  side of head: No preauricular or posterior auricular adenopathy.  ?   Left side of head: No preauricular or posterior auricular adenopathy.  ?   Cervical: No cervical adenopathy.  ?   Upper Body:  ?   Right upper body: No supraclavicular or axillary adenopathy.  ?   Left upper body: No supraclavicular or axillary adenopathy.  ?   Lower Body: No right inguinal adenopathy. No left inguinal adenopathy.  ?Skin: ?   General: Skin is warm and dry.  ?   Findings: No rash.  ?Neurological:  ?   Mental Status: She is alert and oriented to person, place, and time.  ? ? ? ?Assessment and Plan:  ?KATNISS MCHARGUE is a 31 y.o. female  presenting to the [facility] Department for STI screening ? ?1. Screening examination for venereal disease ?Patient accepted all screenings including wet prep, vaginal CT/GC and declined bloodwork for HIV/RPR.  ?Patient meets criteria for HepB screening? No. Ordered? No - does not meet criteria  ?Patient meets criteria for HepC screening? No. Ordered? No - does not meet criteria  ? ?Wet prep results neg    ?No Treatment needed  ?Discussed time line for [facility] results and that patient will be called with positive results and encouraged patient to call if she had not heard in 2 weeks.  ?Counseled to return or seek care for continued or worsening symptoms ?Recommended condom use with all sex ? ?Patient is currently using  no BCM   to prevent pregnancy.   ?- Chlamydia/Gonorrhea  Lab ?- WET PREP FOR [place], YEAST, CLUE ? ?No follow-ups on file. ? ?No future appointments. ? ?Junious Dresser, FNP ? ?

## 2021-03-24 ENCOUNTER — Ambulatory Visit (LOCAL_COMMUNITY_HEALTH_CENTER): Payer: Self-pay

## 2021-03-24 ENCOUNTER — Other Ambulatory Visit: Payer: Self-pay

## 2021-03-24 DIAGNOSIS — Z111 Encounter for screening for respiratory tuberculosis: Secondary | ICD-10-CM

## 2021-03-27 ENCOUNTER — Other Ambulatory Visit: Payer: Self-pay

## 2021-03-27 ENCOUNTER — Ambulatory Visit (LOCAL_COMMUNITY_HEALTH_CENTER): Payer: Medicaid Other | Admitting: Nurse Practitioner

## 2021-03-27 DIAGNOSIS — Z111 Encounter for screening for respiratory tuberculosis: Secondary | ICD-10-CM

## 2021-03-27 LAB — TB SKIN TEST
Induration: 0 mm
TB Skin Test: NEGATIVE

## 2021-05-15 ENCOUNTER — Telehealth: Payer: Self-pay

## 2021-05-15 NOTE — Telephone Encounter (Signed)
Jacy called in asking for a refill on her Valtrex.  ?She saw you last so i'm sending this to you, but she hasn't had an annual since 12/2019.  ?

## 2021-05-16 ENCOUNTER — Other Ambulatory Visit: Payer: Self-pay | Admitting: Obstetrics and Gynecology

## 2021-05-16 MED ORDER — VALACYCLOVIR HCL 500 MG PO TABS: 500.0000 mg | ORAL_TABLET | Freq: Every day | ORAL | 1 refills | Status: AC

## 2021-05-16 NOTE — Telephone Encounter (Signed)
Rx eRxd . Pls notify pt and have her schedule annual. Due for pap.

## 2021-05-16 NOTE — Progress Notes (Signed)
Rx RF valtrex. Pt to schedule annual ?

## 2021-07-02 NOTE — Progress Notes (Unsigned)
    [employer]   No chief complaint on file.   HPI:      [name] is a 31 y.o. [phone] whose LMP was No LMP recorded., presents today for ***    Patient Active Problem List   Diagnosis Date Noted   BV (bacterial vaginosis) 09/18/2020   GC (gonococcus infection) 06/06/2020   Status post laparoscopic cholecystectomy 06/22/2019   BMI 30.0-30.9,adult 07/08/2018   Bipolar I disorder (HCC) dx'd age 59 07/04/2018   Vulvovaginitis due to yeast 07/04/2018   HSV infection 05/10/2018    Past Surgical History:  Procedure Laterality Date   CHOLECYSTECTOMY  05/2019   spontaneous vaginal deliver     twins 12/20   WISDOM TOOTH EXTRACTION      Family History  Problem Relation Age of Onset   Diabetes Mother    Hypertension Mother    Asthma Mother    Asthma Brother    Colon cancer Maternal Grandfather     Social History   Socioeconomic History   Marital status: Single    Spouse name: Not on file   Number of children: Not on file   Years of education: Not on file   Highest education level: Not on file  Occupational History   Not on file  Tobacco Use   Smoking status: Former    Types: Cigarettes    Quit date: 02/07/2013    Years since quitting: 8.4   Smokeless tobacco: Never  Vaping Use   Vaping Use: Never used  Substance and Sexual Activity   Alcohol use: Not Currently    Alcohol/week: 4.0 standard drinks of alcohol    Types: 1 Glasses of wine, 3 Shots of liquor per week    Comment: ocassionally   Drug use: Not Currently    Types: Marijuana    Comment: last use end of July 2022   Sexual activity: Yes    Partners: Male    Birth control/protection: None, Condom  Other Topics Concern   Not on file  Social History Narrative   Not on file   Social Determinants of Health   Financial Resource Strain: Not on file  Food Insecurity: Not on file  Transportation Needs: Not on file  Physical Activity: Not on file  Stress: Not on file   Social Connections: Not on file  Intimate Partner Violence: Not At Risk (03/12/2021)   Humiliation, Afraid, Rape, and Kick questionnaire    Fear of Current or Ex-Partner: No    Emotionally Abused: No    Physically Abused: No    Sexually Abused: No    No outpatient medications prior to visit.   No facility-administered medications prior to visit.      ROS:  Review of Systems BREAST: No symptoms   OBJECTIVE:   Vitals:  There were no vitals taken for this visit.  Physical Exam  Results: No results found for this or any previous visit (from the past 24 hour(s)).   Assessment/Plan: No diagnosis found.    No orders of the defined types were placed in this encounter.     No follow-ups on file.  Madalina Rosman B. Sumaya Riedesel, PA-C 07/02/2021 8:25 PM

## 2021-07-03 ENCOUNTER — Other Ambulatory Visit (HOSPITAL_COMMUNITY)
Admission: RE | Admit: 2021-07-03 | Discharge: 2021-07-03 | Disposition: A | Discharge status: Home / Self Care | Payer: Medicaid Other | Source: Ambulatory Visit | Attending: Obstetrics and Gynecology | Admitting: Obstetrics and Gynecology

## 2021-07-03 ENCOUNTER — Ambulatory Visit: Payer: Medicaid Other | Admitting: Obstetrics and Gynecology

## 2021-07-03 ENCOUNTER — Encounter: Payer: Self-pay | Admitting: Obstetrics and Gynecology

## 2021-07-03 VITALS — BP 120/60 | Ht 65.0 in | Wt 168.0 lb

## 2021-07-03 DIAGNOSIS — Z113 Encounter for screening for infections with a predominantly sexual mode of transmission: Secondary | ICD-10-CM | POA: Insufficient documentation

## 2021-07-03 DIAGNOSIS — A599 Trichomoniasis, unspecified: Secondary | ICD-10-CM | POA: Insufficient documentation

## 2021-07-03 DIAGNOSIS — Z1151 Encounter for screening for human papillomavirus (HPV): Secondary | ICD-10-CM | POA: Diagnosis present

## 2021-07-03 DIAGNOSIS — N760 Acute vaginitis: Secondary | ICD-10-CM

## 2021-07-03 DIAGNOSIS — Z124 Encounter for screening for malignant neoplasm of cervix: Secondary | ICD-10-CM | POA: Diagnosis present

## 2021-07-03 DIAGNOSIS — B9689 Other specified bacterial agents as the cause of diseases classified elsewhere: Secondary | ICD-10-CM | POA: Diagnosis not present

## 2021-07-03 DIAGNOSIS — N926 Irregular menstruation, unspecified: Secondary | ICD-10-CM

## 2021-07-03 LAB — POCT WET PREP WITH KOH
KOH Prep POC: POSITIVE — AB
Trichomonas, UA: NEGATIVE
Yeast Wet Prep HPF POC: NEGATIVE

## 2021-07-03 LAB — POCT URINE PREGNANCY: Preg Test, Ur: NEGATIVE

## 2021-07-03 MED ORDER — METRONIDAZOLE 500 MG PO TABS: ORAL_TABLET | ORAL | 0 refills | Status: DC

## 2021-07-03 NOTE — Patient Instructions (Signed)
I value your feedback and you entrusting us with your care. If you get a [place] patient survey, I would appreciate you taking the time to let us know about your experience today. Thank you! ? ? ?

## 2021-07-04 LAB — CYTOLOGY - PAP
Adequacy: ABSENT
Chlamydia: NEGATIVE
Comment: NEGATIVE
Comment: NEGATIVE
Comment: NEGATIVE
Comment: NORMAL
Diagnosis: NEGATIVE
High risk HPV: NEGATIVE
Neisseria Gonorrhea: NEGATIVE
Trichomonas: NEGATIVE

## 2021-07-04 LAB — HEPATITIS C ANTIBODY: Hep C Virus Ab: NONREACTIVE

## 2021-07-04 LAB — RPR: RPR Ser Ql: NONREACTIVE

## 2021-07-04 LAB — HIV ANTIBODY (ROUTINE TESTING W REFLEX): HIV Screen 4th Generation wRfx: NONREACTIVE

## 2021-08-19 ENCOUNTER — Telehealth: Payer: Self-pay

## 2021-08-19 ENCOUNTER — Other Ambulatory Visit: Payer: Self-pay | Admitting: Obstetrics and Gynecology

## 2021-08-19 MED ORDER — VALACYCLOVIR HCL 500 MG PO TABS: 500.0000 mg | ORAL_TABLET | Freq: Every day | ORAL | 3 refills | Status: DC

## 2021-08-19 NOTE — Progress Notes (Signed)
Rx RF valtrex. Pap/exam 6/23.

## 2021-08-19 NOTE — Telephone Encounter (Signed)
Patient contacted office requesting refill on Valtrex 500mg  sent to [facility]. Medication was last filled 05/16/21, last office visit 07/03/21. Please review and advise. KW

## 2021-08-19 NOTE — Telephone Encounter (Signed)
Rx RF eRxd.  

## 2021-09-24 ENCOUNTER — Encounter: Payer: Self-pay | Admitting: Obstetrics & Gynecology

## 2021-09-24 ENCOUNTER — Other Ambulatory Visit (HOSPITAL_COMMUNITY)
Admission: RE | Admit: 2021-09-24 | Discharge: 2021-09-24 | Disposition: A | Discharge status: Home / Self Care | Payer: Medicaid Other | Source: Ambulatory Visit | Attending: Obstetrics & Gynecology | Admitting: Obstetrics & Gynecology

## 2021-09-24 ENCOUNTER — Ambulatory Visit: Payer: Medicaid Other | Admitting: Obstetrics & Gynecology

## 2021-09-24 VITALS — BP 102/64 | Wt 167.0 lb

## 2021-09-24 DIAGNOSIS — Z113 Encounter for screening for infections with a predominantly sexual mode of transmission: Secondary | ICD-10-CM | POA: Diagnosis present

## 2021-09-24 DIAGNOSIS — N926 Irregular menstruation, unspecified: Secondary | ICD-10-CM | POA: Diagnosis not present

## 2021-09-24 DIAGNOSIS — N760 Acute vaginitis: Secondary | ICD-10-CM | POA: Diagnosis not present

## 2021-09-24 LAB — POCT URINE PREGNANCY: Preg Test, Ur: NEGATIVE

## 2021-09-24 MED ORDER — FLUCONAZOLE 150 MG PO TABS: 150.0000 mg | ORAL_TABLET | Freq: Once | ORAL | 2 refills | Status: AC

## 2021-09-24 NOTE — Progress Notes (Signed)
gyn Subjective:     Katelyn Mendoza is a 31 y.o. female here for a routine exam.  Current complaints: vaginal irritation for the past 2-3 days.  Personal health questionnaire reviewed: yes.   Gynecologic History Patient's last menstrual period was 09/08/2021 (exact date). Contraception: condoms Last Pap: 07/03/2021. Results were: normal Last mammogram: undocumented  Obstetric History OB History  Gravida Para Term Preterm AB Living  4 2 2  0 1 3  SAB IAB Ectopic Multiple Live Births  0 1 0 1 3    # Outcome Date GA Lbr Len/2nd Weight Sex Delivery Anes PTL Lv  4 Gravida           3A Term 12/14/18 [redacted]w[redacted]d  5 lb 6.1 oz (2.44 kg) M Vag-Spont EPI  LIV  3B Term 12/14/18 [redacted]w[redacted]d  4 lb 13.3 oz (2.19 kg) M Vag-Forceps EPI  LIV  2 Term 02/18/15 [redacted]w[redacted]d 06:00 / 02:13 6 lb 10.9 oz (3.03 kg) M Vag-Vacuum EPI  LIV     Birth Comments: Terminal MSAF  1 IAB 2008             The following portions of the patient's history were reviewed and updated as appropriate: allergies, current medications, past family history, past medical history, past social history, past surgical history, and problem list.  Review of Systems A comprehensive review of systems was negative.    Objective:    BP 102/64   Wt 167 lb (75.8 kg)   LMP 09/08/2021 (Exact Date)   BMI 27.79 kg/m  General appearance: alert, cooperative, and no distress Abdomen: soft, non-tender; bowel sounds normal; no masses,  no organomegaly Pelvic: cervix normal in appearance, external genitalia normal, no adnexal masses or tenderness, no cervical motion tenderness, uterus normal size, shape, and consistency, and vagina normal without discharge Extremities: extremities normal, atraumatic, no cyanosis or edema Skin: Skin color, texture, turgor normal. No rashes or lesions    Assessment:  Vaginitis Irreg menses  Healthy female exam.    Plan:  Diflucan 150 mg 1dose, 2 refills fluconazole (DIFLUCAN) 150 MG tablet (Expired)                            Take 1 tablet (150 mg total) by mouth once for 1 dose., Starting Wed 09/24/2021, Normal                               Cervicovaginal ancillary only                         Today, Resulting Agency - [facility] Collection Date-09/24/2021 Collection Time-11:28 AM                       HEP, RPR, HIV Panel                         Routine, Clinic Collect               POCT urine pregnancy                         Routine    RTO as scheduled  09/26/2021, MD  10/20/2021 12:39 AM

## 2021-09-25 LAB — CERVICOVAGINAL ANCILLARY ONLY
Bacterial Vaginitis (gardnerella): POSITIVE — AB
Candida Glabrata: NEGATIVE
Candida Vaginitis: NEGATIVE
Chlamydia: NEGATIVE
Comment: NEGATIVE
Comment: NEGATIVE
Comment: NEGATIVE
Comment: NEGATIVE
Comment: NEGATIVE
Comment: NORMAL
Neisseria Gonorrhea: NEGATIVE
Trichomonas: NEGATIVE

## 2021-09-25 LAB — HEP, RPR, HIV PANEL
HIV Screen 4th Generation wRfx: NONREACTIVE
Hepatitis B Surface Ag: NEGATIVE
RPR Ser Ql: NONREACTIVE

## 2021-09-26 ENCOUNTER — Other Ambulatory Visit: Payer: Self-pay

## 2021-09-26 DIAGNOSIS — B9689 Other specified bacterial agents as the cause of diseases classified elsewhere: Secondary | ICD-10-CM

## 2021-09-26 MED ORDER — METRONIDAZOLE 500 MG PO TABS: 500.0000 mg | ORAL_TABLET | Freq: Two times a day (BID) | ORAL | 0 refills | Status: DC

## 2021-10-20 ENCOUNTER — Encounter: Payer: Self-pay | Admitting: Obstetrics & Gynecology

## 2021-12-01 ENCOUNTER — Other Ambulatory Visit (HOSPITAL_COMMUNITY)
Admission: RE | Admit: 2021-12-01 | Discharge: 2021-12-01 | Disposition: A | Discharge status: Home / Self Care | Payer: Medicaid Other | Source: Ambulatory Visit | Attending: Certified Nurse Midwife | Admitting: Certified Nurse Midwife

## 2021-12-01 ENCOUNTER — Ambulatory Visit (INDEPENDENT_AMBULATORY_CARE_PROVIDER_SITE_OTHER): Payer: Medicaid Other

## 2021-12-01 DIAGNOSIS — Z113 Encounter for screening for infections with a predominantly sexual mode of transmission: Secondary | ICD-10-CM

## 2021-12-01 DIAGNOSIS — Z3202 Encounter for pregnancy test, result negative: Secondary | ICD-10-CM

## 2021-12-01 DIAGNOSIS — N926 Irregular menstruation, unspecified: Secondary | ICD-10-CM

## 2021-12-01 LAB — POCT URINE PREGNANCY: Preg Test, Ur: NEGATIVE

## 2021-12-01 NOTE — Progress Notes (Signed)
Patient presents today for STD testing via self swab culture. She states  this is just a safety concern  . Self swab culture preformed. All questions asked, patient aware office will be in touch with results. Patient also wanted some lab work done as well.

## 2021-12-01 NOTE — Addendum Note (Signed)
Addended by: Cornelius Moras D on: 12/01/2021 01:19 PM   Modules accepted: Orders

## 2021-12-01 NOTE — Addendum Note (Signed)
Addended by: Burtis Junes on: 12/01/2021 01:36 PM   Modules accepted: Orders

## 2021-12-02 LAB — HEP, RPR, HIV PANEL
HIV Screen 4th Generation wRfx: NONREACTIVE
Hepatitis B Surface Ag: NEGATIVE
RPR Ser Ql: NONREACTIVE

## 2021-12-03 LAB — CERVICOVAGINAL ANCILLARY ONLY
Bacterial Vaginitis (gardnerella): POSITIVE — AB
Candida Glabrata: NEGATIVE
Candida Vaginitis: NEGATIVE
Chlamydia: NEGATIVE
Comment: NEGATIVE
Comment: NEGATIVE
Comment: NEGATIVE
Comment: NEGATIVE
Comment: NEGATIVE
Comment: NORMAL
Neisseria Gonorrhea: NEGATIVE
Trichomonas: NEGATIVE

## 2021-12-05 ENCOUNTER — Other Ambulatory Visit: Payer: Self-pay | Admitting: Obstetrics

## 2021-12-05 ENCOUNTER — Encounter: Payer: Self-pay | Admitting: Obstetrics

## 2021-12-05 MED ORDER — CLINDAMYCIN HCL 300 MG PO CAPS: 300.0000 mg | ORAL_CAPSULE | Freq: Two times a day (BID) | ORAL | 0 refills | Status: DC

## 2021-12-06 ENCOUNTER — Telehealth: Payer: Self-pay | Admitting: Licensed Practical Nurse

## 2021-12-06 NOTE — Telephone Encounter (Signed)
TC to Katelyn Mendoza, You have BV, looks like you had BV in September too. Katelyn Mendoza reports that she has had multiple episodes of BV, has been treated with Flagly gel and pills in the past. As we were talking this cnm noticed [name] CNM had ordered clindamycin, reviewed this was probably ordered d/t her reoccurrence of BV, rec taking clindamycin than consider treatment with boric acid. Also, if you continue to have symptoms, please come back to the clinic for additional testing. Carie Caddy, CNM  [facility]  12/06/2021 1:52 PM

## 2022-01-13 ENCOUNTER — Ambulatory Visit: Payer: Medicaid Other

## 2022-02-05 ENCOUNTER — Ambulatory Visit: Payer: Medicaid Other | Admitting: Obstetrics and Gynecology

## 2022-02-05 ENCOUNTER — Encounter: Payer: Self-pay | Admitting: Obstetrics and Gynecology

## 2022-02-05 VITALS — BP 104/76 | Ht 61.0 in | Wt 171.0 lb

## 2022-02-05 DIAGNOSIS — N760 Acute vaginitis: Secondary | ICD-10-CM

## 2022-02-05 DIAGNOSIS — B9689 Other specified bacterial agents as the cause of diseases classified elsewhere: Secondary | ICD-10-CM | POA: Diagnosis not present

## 2022-02-05 DIAGNOSIS — Z113 Encounter for screening for infections with a predominantly sexual mode of transmission: Secondary | ICD-10-CM

## 2022-02-05 LAB — POCT WET PREP WITH KOH
Clue Cells Wet Prep HPF POC: POSITIVE
KOH Prep POC: POSITIVE — AB
Trichomonas, UA: NEGATIVE
Yeast Wet Prep HPF POC: NEGATIVE

## 2022-02-05 MED ORDER — CLINDAMYCIN HCL 300 MG PO CAPS: 300.0000 mg | ORAL_CAPSULE | Freq: Two times a day (BID) | ORAL | 0 refills | Status: AC

## 2022-02-05 NOTE — Progress Notes (Signed)
[employer], Nurse, learning disability Complaint  Patient presents with   STD testing   Vaginal Discharge    Had discharge and itching before cycle, no odor    HPI:      [name] is a 32 y.o. [phone] whose LMP was Patient's last menstrual period was 02/02/2022 (exact date)., presents today for recurrent BV sx of increased vag d/c with irritation, no fishy odor. Pt has had multiple cultures confirming Gardnerella, usually treated with flagyl, treated 11/23 with clindamycin with relief until the past few wks. Pt takes baths regularly, only uses condoms sometimes, wears cotton underwear, using Dove sens skin soap, does boric acid supp occas. No probiotic use. No urin sx.  She is sexually active with new partner since neg STD testing 11/23; wants STD testing today.    Patient Active Problem List   Diagnosis Date Noted   BV (bacterial vaginosis) 09/18/2020   GC (gonococcus infection) 06/06/2020   Status post laparoscopic cholecystectomy 06/22/2019   BMI 30.0-30.9,adult 07/08/2018   Bipolar I disorder ([place]) dx'd age 89 07/04/2018   Vulvovaginitis due to yeast 07/04/2018   HSV infection 05/10/2018    Past Surgical History:  Procedure Laterality Date   CHOLECYSTECTOMY  05/2019   spontaneous vaginal deliver     twins 12/20   WISDOM TOOTH EXTRACTION      Family History  Problem Relation Age of Onset   Diabetes Mother    Hypertension Mother    Asthma Mother    Asthma Brother    Colon cancer Maternal Grandfather     Social History   Socioeconomic History   Marital status: Single    Spouse name: Not on file   Number of children: Not on file   Years of education: Not on file   Highest education level: Not on file  Occupational History   Not on file  Tobacco Use   Smoking status: Former    Types: Cigarettes    Quit date: 02/07/2013    Years since quitting: 9.0   Smokeless tobacco: Never  Vaping Use   Vaping Use: Never used  Substance and Sexual Activity    Alcohol use: Not Currently    Alcohol/week: 4.0 standard drinks of alcohol    Types: 1 Glasses of wine, 3 Shots of liquor per week    Comment: ocassionally   Drug use: Not Currently    Types: Marijuana    Comment: last use end of July 2022   Sexual activity: Yes    Partners: Male    Birth control/protection: None, Condom  Other Topics Concern   Not on file  Social History Narrative   Not on file   Social Determinants of Health   Financial Resource Strain: Not on file  Food Insecurity: Unknown (11/03/2018)   Hunger Vital Sign    Worried About Running Out of Food in the Last Year: Patient refused    [place] in the Last Year: Patient refused  Transportation Needs: No Transportation Needs (11/16/2018)   PRAPARE - Hydrologist (Medical): No    Lack of Transportation (Non-Medical): No  Physical Activity: Unknown (11/03/2018)   Exercise Vital Sign    Days of Exercise per Week: Patient refused    Minutes of Exercise per Session: Patient refused  Stress: No Stress Concern Present (11/16/2018)   [place]    Feeling of Stress : Not at all  Social Connections: Unknown (11/03/2018)   Social Connection and Isolation Panel [NHANES]    Frequency of Communication with Friends and Family: Patient refused    Frequency of Social Gatherings with Friends and Family: Patient refused    Attends Religious Services: Patient refused    Active Member of Clubs or Organizations: Patient refused    Attends Banker Meetings: Patient refused    Marital Status: Patient refused  Intimate Partner Violence: Not At Risk (03/12/2021)   Humiliation, Afraid, Rape, and Kick questionnaire    Fear of Current or Ex-Partner: No    Emotionally Abused: No    Physically Abused: No    Sexually Abused: No    Outpatient Medications Prior to Visit  Medication Sig Dispense Refill   valACYclovir (VALTREX) 500  MG tablet Take 1 tablet (500 mg total) by mouth daily. 90 tablet 3   clindamycin (CLEOCIN) 300 MG capsule Take 1 capsule (300 mg total) by mouth 2 (two) times daily. 14 capsule 0   metroNIDAZOLE (FLAGYL) 500 MG tablet Take 1 tablet (500 mg total) by mouth 2 (two) times daily. 14 tablet 0   No facility-administered medications prior to visit.      ROS:  Review of Systems  Constitutional:  Negative for fever.  Gastrointestinal:  Negative for blood in stool, constipation, diarrhea, nausea and vomiting.  Genitourinary:  Positive for vaginal discharge. Negative for dyspareunia, dysuria, flank pain, frequency, hematuria, urgency, vaginal bleeding and vaginal pain.  Musculoskeletal:  Negative for back pain.  Skin:  Negative for rash.   BREAST: No symptoms   OBJECTIVE:   Vitals:  BP 104/76   Ht 5\' 1"  (1.549 m)   Wt 171 lb (77.6 kg)   LMP 02/02/2022 (Exact Date)   BMI 32.31 kg/m   Physical Exam Vitals reviewed.  Constitutional:      Appearance: She is well-developed.  Pulmonary:     Effort: Pulmonary effort is normal.  Genitourinary:    General: Normal vulva.     Pubic Area: No rash.      Labia:        Right: No rash, tenderness or lesion.        Left: No rash, tenderness or lesion.      Vagina: Bleeding present. No vaginal discharge, erythema or tenderness.     Cervix: Normal.     Uterus: Normal. Not enlarged and not tender.      Adnexa: Right adnexa normal and left adnexa normal.       Right: No mass or tenderness.         Left: No mass or tenderness.    Musculoskeletal:        General: Normal range of motion.     Cervical back: Normal range of motion.  Skin:    General: Skin is warm and dry.  Neurological:     General: No focal deficit present.     Mental Status: She is alert and oriented to person, place, and time.  Psychiatric:        Mood and Affect: Mood normal.        Behavior: Behavior normal.        Thought Content: Thought content normal.         Judgment: Judgment normal.     Results: Results for orders placed or performed in visit on 02/05/22 (from the past 24 hour(s))  POCT Wet Prep with KOH     Status: Abnormal   Collection Time: 02/05/22 12:07 PM  Result Value  Ref Range   Trichomonas, UA Negative    Clue Cells Wet Prep HPF POC pos    Epithelial Wet Prep HPF POC     Yeast Wet Prep HPF POC neg    Bacteria Wet Prep HPF POC     RBC Wet Prep HPF POC     WBC Wet Prep HPF POC     KOH Prep POC Positive (A) Negative     Assessment/Plan: BV (bacterial vaginosis) - Plan: NuSwab Vaginitis Plus (VG+), POCT Wet Prep with KOH, clindamycin (CLEOCIN) 300 MG capsule; recurrent sx, pos wet prep. Rx clindamycin, check type of BV on nuswab culture. Start probiotics, boric acid, use condoms every time, stop baths. If sx recur again soon, will start wkly BV treatment as preventive.   Screening for STD (sexually transmitted disease) - Plan: NuSwab Vaginitis Plus (VG+), HIV Antibody (routine testing w rflx), RPR, Hepatitis C antibody    Meds ordered this encounter  Medications   clindamycin (CLEOCIN) 300 MG capsule    Sig: Take 1 capsule (300 mg total) by mouth 2 (two) times daily for 7 days.    Dispense:  14 capsule    Refill:  0    Order Specific Question:   Supervising Provider    Answer:   Rubie Maid [[id number]]      Return if symptoms worsen or fail to improve.  Katelyn Sear B. Kavina Cantave, PA-C 02/05/2022 12:08 PM

## 2022-02-05 NOTE — Patient Instructions (Signed)
I value your feedback and you entrusting us with your care. If you get a [facility] patient survey, I would appreciate you taking the time to let us know about your experience today. Thank you!  HEALTHY VAGINAL HYGIENE  AVOID   Panytyhose Synthetic underwear (wear COTTON underwear)  Tight pants/jeans Thongs Pantyliners Scented soaps/shower gels (use Dove Sensitive Skin soap or water to clean) Bubble bath/bath bombs Scented detergents  ALL dryer sheets (line dry underwear if using them on your other clothing) Feminine sprays/douches   FOR RECURRENT BACTERIAL VAGINOSIS (BV) Above recommendations and ADD probiotics daily, USE CONDOMS  Visit [email or web address]    

## 2022-02-06 LAB — RPR: RPR Ser Ql: NONREACTIVE

## 2022-02-06 LAB — HIV ANTIBODY (ROUTINE TESTING W REFLEX): HIV Screen 4th Generation wRfx: NONREACTIVE

## 2022-02-06 LAB — HEPATITIS C ANTIBODY: Hep C Virus Ab: NONREACTIVE

## 2022-02-08 LAB — NUSWAB VAGINITIS PLUS (VG+)
Atopobium vaginae: HIGH Score — AB
BVAB 2: HIGH Score — AB
Candida albicans, NAA: NEGATIVE
Candida glabrata, NAA: NEGATIVE
Chlamydia trachomatis, NAA: NEGATIVE
Megasphaera 1: HIGH Score — AB
Neisseria gonorrhoeae, NAA: NEGATIVE
Trich vag by NAA: NEGATIVE

## 2022-03-10 ENCOUNTER — Other Ambulatory Visit: Payer: Self-pay

## 2022-03-10 ENCOUNTER — Encounter: Payer: Self-pay | Admitting: Family Medicine

## 2022-03-10 ENCOUNTER — Encounter: Payer: Self-pay | Admitting: Emergency Medicine

## 2022-03-10 ENCOUNTER — Emergency Department
Admission: EM | Admit: 2022-03-10 | Discharge: 2022-03-10 | Disposition: A | Discharge status: Home / Self Care | Payer: Medicaid Other | Attending: Emergency Medicine | Admitting: Emergency Medicine

## 2022-03-10 ENCOUNTER — Emergency Department: Payer: Medicaid Other

## 2022-03-10 DIAGNOSIS — O4690 Antepartum hemorrhage, unspecified, unspecified trimester: Secondary | ICD-10-CM

## 2022-03-10 LAB — CBC
HCT: 40.1 % (ref 36.0–46.0)
Hemoglobin: 13.3 g/dL (ref 12.0–15.0)
MCH: 26.9 pg (ref 26.0–34.0)
MCHC: 33.2 g/dL (ref 30.0–36.0)
MCV: 81 fL (ref 80.0–100.0)
Platelets: 351 10*3/uL (ref 150–400)
RBC: 4.95 MIL/uL (ref 3.87–5.11)
RDW: 13.1 % (ref 11.5–15.5)
WBC: 5.2 10*3/uL (ref 4.0–10.5)
nRBC: 0 % (ref 0.0–0.2)

## 2022-03-10 LAB — URINALYSIS, ROUTINE W REFLEX MICROSCOPIC
Bilirubin Urine: NEGATIVE
Glucose, UA: NEGATIVE mg/dL
Ketones, ur: NEGATIVE mg/dL
Leukocytes,Ua: NEGATIVE
Nitrite: NEGATIVE
Protein, ur: NEGATIVE mg/dL
Specific Gravity, Urine: 1.014 (ref 1.005–1.030)
pH: 7 (ref 5.0–8.0)

## 2022-03-10 LAB — HCG, QUANTITATIVE, PREGNANCY: hCG, Beta Chain, Quant, S: 51 m[IU]/mL — ABNORMAL HIGH (ref <5)

## 2022-03-10 LAB — ABO/RH: ABO/RH(D): A POS

## 2022-03-10 NOTE — ED Triage Notes (Signed)
Pt states that she is approx 4 weeks preg and started with light spotting yesterday and states today what she is seeing looks like her period, pt states that she was seen by Juanda Crumble drew yesterday for the spotting, states that they didn't refer her for an u/s

## 2022-03-10 NOTE — ED Provider Notes (Signed)
[facility] Provider Note    Event Date/Time   First MD Initiated Contact with Patient 03/10/22 1101     (approximate)   History   Vaginal Bleeding   HPI  Katelyn Mendoza is a 32 y.o. female G5, P3 currently approximately 3 to [redacted] weeks pregnant who presents today for evaluation of vaginal bleeding.  Patient reports that she found out that she was pregnant yesterday, LMP was January 23.  Patient reports that she had light spotting yesterday which was slightly heavier today.  She has not soaked through a pad.  She denies abdominal pain, nausea, vomiting, fevers, chills.  No other vaginal discharge.  No dysuria.  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 --      Excl. in [place]? --     Most recent vital signs: Vitals:   03/10/22 1034 03/10/22 1250  BP: 105/71 110/80  Pulse: 73 71  Resp: 16 16  Temp: 98.3 F (36.8 C)   SpO2: 99% 100%    Physical Exam Vitals and nursing note reviewed.  Constitutional:      General: Awake and alert. No acute distress.    Appearance: Normal appearance. The patient is overweight.  HENT:     Head: Normocephalic and atraumatic.     Mouth: Mucous membranes are moist.   Eyes:     General: PERRL. Normal EOMs        Right eye: No discharge.        Left eye: No discharge.     Conjunctiva/sclera: Conjunctivae normal.  Cardiovascular:     Rate and Rhythm: Normal rate and regular rhythm.     Pulses: Normal pulses.     Heart sounds: Normal heart sounds Pulmonary:     Effort: Pulmonary effort is normal. No respiratory distress.     Breath sounds: Normal breath sounds.  Abdominal:     Abdomen is soft. There is no abdominal tenderness. No rebound or guarding. No distention. Musculoskeletal:        General: No swelling. Normal range of motion.     Cervical back: Normal range of motion and neck supple.  Skin:    General: Skin is warm and dry.     Capillary Refill: Capillary refill takes less than 2 seconds.     Findings: No rash.  Neurological:     Mental Status: The patient is awake and alert.  ED Results / Procedures / Treatments   Labs (all labs ordered are listed, but only abnormal results are displayed) Labs Reviewed  HCG, QUANTITATIVE, PREGNANCY - Abnormal; Notable for the following components:      Result Value   hCG, Beta Chain, Quant, S 51 (*)    All other components within normal limits  URINALYSIS, ROUTINE W REFLEX MICROSCOPIC - Abnormal; Notable for the following components:   Color, Urine YELLOW (*)    APPearance CLEAR (*)    Hgb urine dipstick LARGE (*)    Bacteria, UA RARE (*)    All other components within normal limits  CBC  ABO/RH     EKG     RADIOLOGY I independently reviewed and interpreted imaging and agree with radiologists findings.     PROCEDURES:  Critical Care performed:   Procedures   MEDICATIONS ORDERED IN ED: Medications - No data to display   IMPRESSION / MDM / [place] / ED COURSE  I reviewed the triage vital signs and the nursing notes.   Differential diagnosis includes, but is not limited to, implantation bleeding, ectopic pregnancy, subchorionic hemorrhage,  inevitable/spontaneous abortion.  Patient is awake and alert, hemodynamically stable and afebrile.  She has no abdominal tenderness.  CBC obtained in triage demonstrates a normal H&H.  She is Rh+, no need for RhoGAM.  Ultrasound reveals no IUP, however her serum hCG is only 51, likely very early pregnancy vs spontaneous abortion.  She has no abdominal pain to suggest ectopic pregnancy.  I recommended repeat hCG in 2 days to see if it is uptrending in which case would need to trend with ultrasounds in order to localize pregnancy, versus downtrending which would suggest a spontaneous abortion.  Patient reports that she has an appointment with OB/GYN already established in 2 days.  We discussed strict return precautions including ectopic precautions in the meantime.  Patient understands and agrees with plan.  She was discharged in stable condition.   Patient's presentation is most consistent with acute presentation with potential threat to life or bodily function.       FINAL CLINICAL IMPRESSION(S) / ED DIAGNOSES   Final diagnoses:  Vaginal bleeding in pregnancy     Rx / DC Orders   ED Discharge Orders     None        Note:  This document was prepared using Dragon voice recognition software and may include unintentional dictation errors.   Emeline Gins 03/10/22 1601    Lavonia Drafts, MD 03/14/22 [phone]

## 2022-03-10 NOTE — ED Notes (Signed)
Called lab to addon [facility].

## 2022-03-10 NOTE — Discharge Instructions (Addendum)
Please see OB/GYN in 2 days for a hCG recheck.  Your number today is [phone].  Please return the emergency department immediately if you develop any pain, or any other new, worsening, or change in symptoms or other concerns.  It was a pleasure caring for you today.

## 2022-03-10 NOTE — ED Notes (Signed)
See triage note Presents with some vaginal spotting  States she is approx [redacted] weeks pregnant    States bleeding is light

## 2022-03-10 NOTE — ED Notes (Signed)
Pt in gown and cart at bedside

## 2022-03-11 ENCOUNTER — Other Ambulatory Visit: Payer: Self-pay | Admitting: Family Medicine

## 2022-03-11 DIAGNOSIS — O26851 Spotting complicating pregnancy, first trimester: Secondary | ICD-10-CM

## 2022-03-11 DIAGNOSIS — Z3201 Encounter for pregnancy test, result positive: Secondary | ICD-10-CM

## 2022-03-12 ENCOUNTER — Ambulatory Visit (INDEPENDENT_AMBULATORY_CARE_PROVIDER_SITE_OTHER): Payer: Medicaid Other

## 2022-03-12 VITALS — BP 106/73 | Ht 61.0 in | Wt 175.0 lb

## 2022-03-12 DIAGNOSIS — Z3202 Encounter for pregnancy test, result negative: Secondary | ICD-10-CM | POA: Diagnosis not present

## 2022-03-12 DIAGNOSIS — Z3200 Encounter for pregnancy test, result unknown: Secondary | ICD-10-CM

## 2022-03-12 LAB — POCT URINE PREGNANCY: Preg Test, Ur: NEGATIVE

## 2022-03-12 NOTE — Progress Notes (Cosign Needed)
    NURSE VISIT NOTE  Subjective:    Patient ID: NIKKOL CONKEL, female    DOB: [birth year], 32 y.o.   MRN: [id number]  HPI  Patient is a 32 y.o. G2P2013 female who presents for evaluation of amenorrhea. She believes she could be pregnant.Patient stated she went to [facility] where they told her she was Prenate by a pregnancy test but did have bleeding on Monday "it was red" then she started to spot but noting that was too concerning as she stated"it was only light bleeding" . Last period was 02/08/2021 .Today in office two pregnancy tests were done which were both negative, put in orders for a beta quant. Patient stated she had a ultrasound but was told maybe she was early, Juanda Crumble drew Provider ordered another ultra sound as well.     Objective:    LMP 02/02/2022 (Exact Date)   Lab Review  Results for orders placed or performed in visit on 03/12/22  POCT urine pregnancy  Result Value Ref Range   Preg Test, Ur Negative Negative    Assessment:   1. Possible pregnancy     Plan:  Pregnancy test negative. Beta Quant ordered helped patient schedule apt for tomorrow for lab  Placed orders for beta.    Landis Gandy, [place]

## 2022-03-13 ENCOUNTER — Other Ambulatory Visit: Payer: Medicaid Other

## 2022-03-13 NOTE — Telephone Encounter (Signed)
Patient called back and I let her know that SG will reach out to her PCP to get the blood pressure from today.

## 2022-03-26 ENCOUNTER — Encounter: Payer: Self-pay | Admitting: Obstetrics and Gynecology

## 2022-03-26 ENCOUNTER — Ambulatory Visit: Payer: Medicaid Other

## 2022-03-26 ENCOUNTER — Other Ambulatory Visit (HOSPITAL_COMMUNITY)
Admission: RE | Admit: 2022-03-26 | Discharge: 2022-03-26 | Disposition: A | Discharge status: Home / Self Care | Payer: Medicaid Other | Source: Ambulatory Visit | Attending: Obstetrics and Gynecology | Admitting: Obstetrics and Gynecology

## 2022-03-26 VITALS — Ht 61.0 in | Wt 173.0 lb

## 2022-03-26 DIAGNOSIS — N898 Other specified noninflammatory disorders of vagina: Secondary | ICD-10-CM | POA: Insufficient documentation

## 2022-03-26 NOTE — Patient Instructions (Signed)
Vaginitis  Vaginitis is irritation and swelling of the vagina. Treatment will depend on the cause. What are the causes? It can be caused by: Bacteria. Yeast. A parasite. A virus. Low hormone levels. Bubble baths, scented tampons, and feminine sprays. Other things can change the balance of the yeast and bacteria that live in the vagina. These include: Antibiotic medicines. Not being clean enough. Some birth control methods. Sex. Infection. Diabetes. A weakened body defense system (immune system). What increases the risk? Smoking or being around someone who smokes. Using washes (douches), scented tampons, or scented pads. Wearing tight pants or thong underwear. Using birth control pills or an IUD. Having sex without a condom or having a lot of partners. Having an STI. Using a certain product to kill sperm (nonoxynol-9). Eating foods that are high in sugar. Having diabetes. Having low levels of a female hormone. Having a weakened body defense system. Being pregnant or breastfeeding. What are the signs or symptoms? Fluid coming from the vagina that is not normal. A bad smell. Itching, pain, or swelling. Pain with sex. Pain or burning when you pee (urinate). Sometimes there are no symptoms. How is this treated? Treatment may include: Antibiotic creams or pills. Antifungal medicines. Medicines to ease symptoms if you have a virus. Your sex partner should also be treated. Estrogen medicines. Avoiding scented soaps, sprays, or douches. Stopping use of products that caused irritation and then using a cream to treat symptoms. Follow these instructions at home: Lifestyle Keep the area around your vagina clean and dry. Avoid using soap. Rinse the area with water. Until your doctor says it is okay: Do not use washes for the vagina. Do not use tampons. Do not have sex. Wipe from front to back after going to the bathroom. When your doctor says it is okay, practice safe sex  and use condoms. General instructions Take over-the-counter and prescription medicines only as told by your doctor. If you were prescribed an antibiotic medicine, take or use it as told by your doctor. Do not stop taking or using it even if you start to feel better. Keep all follow-up visits. How is this prevented? Do not use things that can irritate the vagina, such as fabric softeners. Avoid these products if they are scented: Sprays. Detergents. Tampons. Products for cleaning the vagina. Soaps or bubble baths. Let air reach your vagina. To do this: Wear cotton underwear. Do not wear: Underwear while you sleep. Tight pants. Thong underwear. Underwear or nylons without a cotton panel. Take off any wet clothing, such as bathing suits, as soon as you can. Practice safe sex and use condoms. Contact a doctor if: You have pain in your belly or in the area between your hips. You have a fever or chills. Your symptoms last for more than 2-3 days. Get help right away if: You have a fever and your symptoms get worse all of a sudden. Summary Vaginitis is irritation and swelling of the vagina. Treatment will depend on the cause of the condition. Do not use washes or tampons or have sex until your doctor says it is okay. This information is not intended to replace advice given to you by your health care provider. Make sure you discuss any questions you have with your health care provider. Document Revised: 06/29/2019 Document Reviewed: 06/29/2019 Elsevier Patient Education  [place].  Bacterial Vaginosis  Bacterial vaginosis is an infection of the vagina. It happens when too many normal germs (healthy bacteria) grow in the vagina. This  infection can make it easier to get other infections from sex (STIs). It is very important for pregnant women to get treated. This infection can cause babies to be born early or at a low birth weight. What are the causes? This infection is caused  by an increase in certain germs that grow in the vagina. You cannot get this infection from toilet seats, bedsheets, swimming pools, or things that touch your vagina. What increases the risk? Having sex with a new person or more than one person. Having sex without protection. Douching. Having an intrauterine device (IUD). Smoking. Using drugs or drinking alcohol. These can lead you to do things that are risky. Taking certain antibiotic medicines. Being pregnant. What are the signs or symptoms? Some women have no symptoms. Symptoms may include: A discharge from your vagina. It may be gray or white. It can be watery or foamy. A fishy smell. This can happen after sex or during your menstrual period. Itching in and around your vagina. A feeling of burning or pain when you pee (urinate). How is this treated? This infection is treated with antibiotic medicines. These may be given to you as: A pill. A cream for your vagina. A medicine that you put into your vagina (suppository). If the infection comes back after treatment, you may need more antibiotics. Follow these instructions at home: Medicines Take over-the-counter and prescription medicines as told by your doctor. Take or use your antibiotic medicine as told by your doctor. Do not stop taking or using it, even if you start to feel better. General instructions If the person you have sex with is a woman, tell her that you have this infection. She will need to follow up with her doctor. If you have a female partner, he does not need to be treated. Do not have sex until you finish treatment. Drink enough fluid to keep your pee pale yellow. Keep your vagina and butt clean. Wash the area with warm water each day. Wipe from front to back after you use the toilet. If you are breastfeeding a baby, ask your doctor if you should keep doing so during treatment. Keep all follow-up visits. How is this prevented? Self-care Do not douche. Use only  warm water to wash around your vagina. Wear underwear that is cotton or lined with cotton. Do not wear tight pants and pantyhose, especially in the summer. Safe sex Use protection when you have sex. This includes: Use condoms. Use dental dams. This is a thin layer that protects the mouth during oral sex. Limit how many people you have sex with. To prevent this infection, it is best to have sex with just one person. Get tested for STIs. The person you have sex with should also get tested. Drugs and alcohol Do not smoke or use any products that contain nicotine or tobacco. If you need help quitting, ask your doctor. Do not use drugs. Do not drink alcohol if: Your doctor tells you not to drink. You are pregnant, may be pregnant, or are planning to become pregnant. If you drink alcohol: Limit how much you have to 0-1 drink a day. Know how much alcohol is in your drink. In the [place] one drink equals one 12 oz bottle of beer (355 mL), one 5 oz glass of wine (148 mL), or one 1 oz glass of hard liquor (44 mL). Where to find more information Centers for Disease Control and Prevention: http://www.wolf.info/ American Sexual Health Association: www.ashastd.org Office on [employer] Health: [place] Contact  a doctor if: Your symptoms do not get better, even after you are treated. You have more discharge or pain when you pee. You have a fever or chills. You have pain in your belly (abdomen) or in the area between your hips. You have pain with sex. You bleed from your vagina between menstrual periods. Summary This infection can happen when too many germs (bacteria) grow in the vagina. This infection can make it easier to get infections from sex (STIs). Treating this can lower that chance. Get treated if you are pregnant. This infection can cause babies to be born early. Do not stop taking or using your antibiotic medicine, even if you start to feel better. This information is not intended to  replace advice given to you by your health care provider. Make sure you discuss any questions you have with your health care provider. Document Revised: 06/29/2019 Document Reviewed: 06/29/2019 Elsevier Patient Education  [place].

## 2022-03-26 NOTE — Progress Notes (Signed)
    NURSE VISIT NOTE  Subjective:    Patient ID: Katelyn Mendoza, female    DOB: 1990/11/10, 32 y.o.   MRN: [id number]  HPI  Patient is a 32 y.o. [id number] female who presents for self swab nurse visit. Has been having vaginal discharge for the past 1-2 days. Denies vaginal itching, odor, irritation.   Objective:    Ht 5\' 1"  (1.549 m)   Wt 173 lb (78.5 kg)   LMP 02/02/2022 (Exact Date)   Breastfeeding No   BMI 32.69 kg/m     Assessment:   1. Vaginal discharge      Plan:   Aptima swab sent to lab. Treatment: Will wait for results to be treated if needed. ROV prn if symptoms persist or worsen.   Drenda Freeze, CMA

## 2022-03-27 LAB — CERVICOVAGINAL ANCILLARY ONLY
Bacterial Vaginitis (gardnerella): NEGATIVE
Candida Glabrata: NEGATIVE
Candida Vaginitis: NEGATIVE
Comment: NEGATIVE
Comment: NEGATIVE
Comment: NEGATIVE

## 2022-06-09 ENCOUNTER — Emergency Department: Payer: Medicaid Other

## 2022-06-09 ENCOUNTER — Other Ambulatory Visit: Payer: Self-pay

## 2022-06-09 DIAGNOSIS — Z5321 Procedure and treatment not carried out due to patient leaving prior to being seen by health care provider: Secondary | ICD-10-CM | POA: Insufficient documentation

## 2022-06-09 DIAGNOSIS — R42 Dizziness and giddiness: Secondary | ICD-10-CM | POA: Diagnosis present

## 2022-06-09 DIAGNOSIS — R110 Nausea: Secondary | ICD-10-CM | POA: Insufficient documentation

## 2022-06-09 LAB — URINALYSIS, ROUTINE W REFLEX MICROSCOPIC
Bilirubin Urine: NEGATIVE
Glucose, UA: NEGATIVE mg/dL
Ketones, ur: NEGATIVE mg/dL
Nitrite: NEGATIVE
Protein, ur: NEGATIVE mg/dL
Specific Gravity, Urine: 1.014 (ref 1.005–1.030)
pH: 5 (ref 5.0–8.0)

## 2022-06-09 LAB — BASIC METABOLIC PANEL
Anion gap: 7 (ref 5–15)
BUN: 12 mg/dL (ref 6–20)
CO2: 27 mmol/L (ref 22–32)
Calcium: 9 mg/dL (ref 8.9–10.3)
Chloride: 102 mmol/L (ref 98–111)
Creatinine, Ser: 0.72 mg/dL (ref 0.44–1.00)
GFR, Estimated: >60 mL/min (ref >60)
Glucose, Bld: 116 mg/dL — ABNORMAL HIGH (ref 70–99)
Potassium: 3.5 mmol/L (ref 3.5–5.1)
Sodium: 136 mmol/L (ref 135–145)

## 2022-06-09 LAB — POC URINE PREG, ED: Preg Test, Ur: NEGATIVE

## 2022-06-09 LAB — TROPONIN I (HIGH SENSITIVITY): Troponin I (High Sensitivity): <2 ng/L (ref <18)

## 2022-06-09 LAB — CBC
HCT: 41.6 % (ref 36.0–46.0)
Hemoglobin: 13.9 g/dL (ref 12.0–15.0)
MCH: 27.2 pg (ref 26.0–34.0)
MCHC: 33.4 g/dL (ref 30.0–36.0)
MCV: 81.4 fL (ref 80.0–100.0)
Platelets: 385 10*3/uL (ref 150–400)
RBC: 5.11 MIL/uL (ref 3.87–5.11)
RDW: 12.5 % (ref 11.5–15.5)
WBC: 6.6 10*3/uL (ref 4.0–10.5)
nRBC: 0 % (ref 0.0–0.2)

## 2022-06-09 NOTE — ED Triage Notes (Signed)
Pt to ED via POV c/o dizziness that has been been going on/for about 3 weeks. Pt states she felt lightheaded and nauseous today. Also reports being more tired than usual. Pt denies fevers, sob, cp.

## 2022-06-10 ENCOUNTER — Emergency Department
Admission: EM | Admit: 2022-06-10 | Discharge: 2022-06-10 | Discharge status: Against Medical Advice | Payer: Medicaid Other | Attending: Emergency Medicine | Admitting: Emergency Medicine

## 2022-06-10 NOTE — ED Notes (Signed)
No answer when called several times from lobby 

## 2022-06-11 ENCOUNTER — Encounter: Payer: Self-pay | Admitting: *Deleted

## 2022-06-11 ENCOUNTER — Other Ambulatory Visit: Payer: Self-pay

## 2022-06-11 ENCOUNTER — Emergency Department
Admission: EM | Admit: 2022-06-11 | Discharge: 2022-06-11 | Disposition: A | Discharge status: Home / Self Care | Payer: Medicaid Other | Attending: Emergency Medicine | Admitting: Emergency Medicine

## 2022-06-11 ENCOUNTER — Emergency Department: Payer: Medicaid Other

## 2022-06-11 DIAGNOSIS — R197 Diarrhea, unspecified: Secondary | ICD-10-CM | POA: Insufficient documentation

## 2022-06-11 DIAGNOSIS — R1013 Epigastric pain: Secondary | ICD-10-CM | POA: Diagnosis not present

## 2022-06-11 DIAGNOSIS — R1110 Vomiting, unspecified: Secondary | ICD-10-CM | POA: Insufficient documentation

## 2022-06-11 LAB — CBC
HCT: 41.4 % (ref 36.0–46.0)
Hemoglobin: 13.8 g/dL (ref 12.0–15.0)
MCH: 27.1 pg (ref 26.0–34.0)
MCHC: 33.3 g/dL (ref 30.0–36.0)
MCV: 81.3 fL (ref 80.0–100.0)
Platelets: 396 10*3/uL (ref 150–400)
RBC: 5.09 MIL/uL (ref 3.87–5.11)
RDW: 12.6 % (ref 11.5–15.5)
WBC: 7.2 10*3/uL (ref 4.0–10.5)
nRBC: 0 % (ref 0.0–0.2)

## 2022-06-11 LAB — COMPREHENSIVE METABOLIC PANEL
ALT: 20 U/L (ref 0–44)
AST: 23 U/L (ref 15–41)
Albumin: 4.1 g/dL (ref 3.5–5.0)
Alkaline Phosphatase: 57 U/L (ref 38–126)
Anion gap: 8 (ref 5–15)
BUN: 13 mg/dL (ref 6–20)
CO2: 25 mmol/L (ref 22–32)
Calcium: 9 mg/dL (ref 8.9–10.3)
Chloride: 104 mmol/L (ref 98–111)
Creatinine, Ser: 0.69 mg/dL (ref 0.44–1.00)
GFR, Estimated: >60 mL/min (ref >60)
Glucose, Bld: 142 mg/dL — ABNORMAL HIGH (ref 70–99)
Potassium: 3.2 mmol/L — ABNORMAL LOW (ref 3.5–5.1)
Sodium: 137 mmol/L (ref 135–145)
Total Bilirubin: 0.6 mg/dL (ref 0.3–1.2)
Total Protein: 7.5 g/dL (ref 6.5–8.1)

## 2022-06-11 LAB — URINALYSIS, ROUTINE W REFLEX MICROSCOPIC
Bilirubin Urine: NEGATIVE
Glucose, UA: NEGATIVE mg/dL
Hgb urine dipstick: NEGATIVE
Ketones, ur: NEGATIVE mg/dL
Leukocytes,Ua: NEGATIVE
Nitrite: NEGATIVE
Protein, ur: NEGATIVE mg/dL
Specific Gravity, Urine: 1.016 (ref 1.005–1.030)
pH: 5 (ref 5.0–8.0)

## 2022-06-11 LAB — POC URINE PREG, ED: Preg Test, Ur: NEGATIVE

## 2022-06-11 LAB — LIPASE, BLOOD: Lipase: 50 U/L (ref 11–51)

## 2022-06-11 MED ORDER — SODIUM CHLORIDE 0.9 % IV BOLUS
1000.0000 mL | Freq: Once | INTRAVENOUS | Status: AC
  Administered 2022-06-11: 1000 mL via INTRAVENOUS

## 2022-06-11 MED ORDER — ONDANSETRON HCL 4 MG/2ML IJ SOLN
4.0000 mg | Freq: Once | INTRAMUSCULAR | Status: AC
  Administered 2022-06-11: 4 mg via INTRAVENOUS
  Filled 2022-06-11: qty 2

## 2022-06-11 MED ORDER — DICYCLOMINE HCL 10 MG PO CAPS: 10.0000 mg | ORAL_CAPSULE | Freq: Three times a day (TID) | ORAL | 0 refills | Status: DC

## 2022-06-11 MED ORDER — ONDANSETRON 4 MG PO TBDP: 4.0000 mg | ORAL_TABLET | Freq: Three times a day (TID) | ORAL | 0 refills | Status: AC | PRN

## 2022-06-11 MED ORDER — FAMOTIDINE IN NACL 20-0.9 MG/50ML-% IV SOLN
20.0000 mg | Freq: Once | INTRAVENOUS | Status: AC
  Administered 2022-06-11: 20 mg via INTRAVENOUS
  Filled 2022-06-11: qty 50

## 2022-06-11 MED ORDER — POTASSIUM CHLORIDE CRYS ER 20 MEQ PO TBCR
40.0000 meq | EXTENDED_RELEASE_TABLET | Freq: Once | ORAL | Status: AC
  Administered 2022-06-11: 40 meq via ORAL
  Filled 2022-06-11: qty 2

## 2022-06-11 MED ORDER — IOHEXOL 300 MG/ML  SOLN
100.0000 mL | Freq: Once | INTRAMUSCULAR | Status: AC | PRN
  Administered 2022-06-11: 100 mL via INTRAVENOUS

## 2022-06-11 MED ORDER — OMEPRAZOLE MAGNESIUM 20 MG PO TBEC: 40.0000 mg | DELAYED_RELEASE_TABLET | Freq: Every day | ORAL | 0 refills | Status: DC

## 2022-06-11 NOTE — Discharge Instructions (Addendum)
Take Zofran every 8 hours for nausea. You can take Bentyl up to 3 times daily for abdominal spasms. Take omeprazole, 40 mg once daily for the next 4 weeks

## 2022-06-11 NOTE — ED Triage Notes (Signed)
Pt has abd pain with vomiting and diarrhea today.   No back pain.  Denies urinary sx.  Pt alert.

## 2022-06-11 NOTE — ED Provider Notes (Signed)
[facility] Provider Note  Patient Contact: 5:12 PM (approximate)   History   Abdominal Pain   HPI  Katelyn Mendoza is a 32 y.o. female with a history of prior cholecystectomy, presents to the emergency department with epigastric abdominal discomfort, vomiting and diarrhea that started today.  Patient reports that she developed some sharp upper abdominal discomfort.  No radiation of pain to her back.  No fever or chills.  No associated rhinorrhea, nasal congestion or nonproductive cough.  Patient does have questions about possible pregnancy.      Physical Exam   Triage Vital Signs: ED Triage Vitals  Enc Vitals Group     BP 06/11/22 1559 119/77     Pulse Rate 06/11/22 1559 98     Resp 06/11/22 1559 18     Temp 06/11/22 1559 98.7 F (37.1 C)     Temp Source 06/11/22 1559 Oral     SpO2 06/11/22 1559 98 %     Weight 06/11/22 1557 175 lb (79.4 kg)     Height 06/11/22 1557 5\' 1"  (1.549 m)     Head Circumference --      Peak Flow --      Pain Score 06/11/22 1556 8     Pain Loc --      Pain Edu? --      Excl. in GC? --     Most recent vital signs: Vitals:   06/11/22 1559  BP: 119/77  Pulse: 98  Resp: 18  Temp: 98.7 F (37.1 C)  SpO2: 98%     General: Alert and in no acute distress. Eyes:  PERRL. EOMI. Head: No acute traumatic findings ENT:      Nose: No congestion/rhinnorhea.      Mouth/Throat: Mucous membranes are moist.  Neck: No stridor. No cervical spine tenderness to palpation. Cardiovascular:  Good peripheral perfusion Respiratory: Normal respiratory effort without tachypnea or retractions. Lungs CTAB. Good air entry to the bases with no decreased or absent breath sounds. Gastrointestinal: Bowel sounds 4 quadrants. Soft and nontender to palpation. No guarding or rigidity. No palpable masses. No distention. No CVA tenderness. Musculoskeletal: Full range of motion to all extremities.  Neurologic:  No gross focal neurologic deficits  are appreciated.  Skin:   No rash noted Other:   ED Results / Procedures / Treatments   Labs (all labs ordered are listed, but only abnormal results are displayed) Labs Reviewed  COMPREHENSIVE METABOLIC PANEL - Abnormal; Notable for the following components:      Result Value   Potassium 3.2 (*)    Glucose, Bld 142 (*)    All other components within normal limits  URINALYSIS, ROUTINE W REFLEX MICROSCOPIC - Abnormal; Notable for the following components:   Color, Urine YELLOW (*)    APPearance CLEAR (*)    All other components within normal limits  LIPASE, BLOOD  CBC  POC URINE PREG, ED        RADIOLOGY  I personally viewed and evaluated these images as part of my medical decision making, as well as reviewing the written report by the radiologist.  ED Provider Interpretation: CT abdomen pelvis unremarkable.   PROCEDURES:  Critical Care performed: No  Procedures   MEDICATIONS ORDERED IN ED: Medications  sodium chloride 0.9 % bolus 1,000 mL (1,000 mLs Intravenous New Bag/Given 06/11/22 1715)  ondansetron (ZOFRAN) injection 4 mg (4 mg Intravenous Given 06/11/22 1716)  potassium chloride SA (KLOR-CON M) CR tablet 40 mEq (40 mEq Oral  Given 06/11/22 1713)  famotidine (PEPCID) IVPB 20 mg premix (20 mg Intravenous New Bag/Given 06/11/22 1718)  iohexol (OMNIPAQUE) 300 MG/ML solution 100 mL (100 mLs Intravenous Contrast Given 06/11/22 1740)     IMPRESSION / MDM / ASSESSMENT AND PLAN / ED COURSE  I reviewed the triage vital signs and the nursing notes.                              Assessment and plan: Abdominal discomfort:  32 year old female presents to the emergency department with epigastric abdominal discomfort for the past 24 hours.  Vital signs are reassuring at triage.  On exam, patient was alert and nontoxic-appearing.  She had some tenderness to palpation in the epigastric abdomen but no guarding.  Differential diagnosis included pancreatitis, bowel obstruction,  gastroenteritis, gastritis, GERD...  CBC, CMP and lipase largely within range.  CMP did indicate some very mild hypokalemia.  Urinalysis shows no signs of UTI.  Urine pregnancy test was negative.  Will give 40 mEq of potassium chloride and will administer normal saline bolus with IV Pepcid.  Will obtain CT abdomen pelvis and will reassess.   CT abdomen pelvis unremarkable.  Upon recheck, patient was resting comfortably.  Will discharge patient with Zofran, Bentyl and omeprazole.  Suspect gastroenteritis versus gastritis.  Return precautions were given to return with new or worsening symptoms.  All patient questions were answered.  FINAL CLINICAL IMPRESSION(S) / ED DIAGNOSES   Final diagnoses:  Epigastric pain     Rx / DC Orders   ED Discharge Orders          Ordered    dicyclomine (BENTYL) 10 MG capsule  3 times daily before meals & bedtime        06/11/22 1924    ondansetron (ZOFRAN-ODT) 4 MG disintegrating tablet  Every 8 hours PRN        06/11/22 1924    omeprazole (PRILOSEC OTC) 20 MG tablet  Daily        06/11/22 1924             Note:  This document was prepared using Dragon voice recognition software and may include unintentional dictation errors.   Pia Mau N[place]asee, PA-C 06/11/22 1929    Dionne Bucy, MD 06/12/22 1346

## 2022-09-09 NOTE — Progress Notes (Unsigned)
[employer]   No chief complaint on file.   HPI:      [name] is a 32 y.o. 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/10/2018    Past Surgical History:  Procedure Laterality Date   CHOLECYSTECTOMY  05/2019   spontaneous vaginal deliver     twins 12/20   WISDOM TOOTH EXTRACTION      Family History  Problem Relation Age of Onset   Diabetes Mother    Hypertension Mother    Asthma Mother    Asthma Brother    Colon cancer Maternal Grandfather     Social History   Socioeconomic History   Marital status: Single    Spouse name: Not on file   Number of children: Not on file   Years of education: Not on file   Highest education level: Not on file  Occupational History   Not on file  Tobacco Use   Smoking status: Former    Current packs/day: 0.00    Types: Cigarettes    Quit date: 02/07/2013    Years since quitting: 9.5   Smokeless tobacco: Never  Vaping Use   Vaping status: Never Used  Substance and Sexual Activity   Alcohol use: Not Currently    Alcohol/week: 4.0 standard drinks of alcohol    Types: 1 Glasses of wine, 3 Shots of liquor per week    Comment: ocassionally   Drug use: Not Currently    Types: Marijuana    Comment: last use end of July 2022   Sexual activity: Not Currently    Partners: Male    Birth control/protection: None  Other Topics Concern   Not on file  Social History Narrative   Not on file   Social Determinants of Health   Financial Resource Strain: Not on file  Food Insecurity: Unknown (11/03/2018)   Hunger Vital Sign    Worried About Running Out of  Food in the Last Year: Patient declined    Ran Out of Food in the Last Year: Patient declined  Transportation Needs: No Transportation Needs (11/16/2018)   PRAPARE - Administrator, Civil Service (Medical): No    Lack of Transportation (Non-Medical): No  Physical Activity: Unknown (11/03/2018)   Exercise Vital Sign    Days of Exercise per Week: Patient declined    Minutes of Exercise per Session: Patient declined  Stress: No Stress Concern Present (11/16/2018)   [employer] of Occupational Health - Occupational Stress Questionnaire    Feeling of Stress : Not at all  Social Connections: Unknown (11/03/2018)   Social Connection and Isolation Panel [NHANES]    Frequency of Communication with Friends and Family: Patient declined    Frequency of Social Gatherings with Friends and Family: Patient declined    Attends Religious Services: Patient declined    Database administrator or Organizations: Patient declined    Attends Banker Meetings: Patient declined    Marital Status: Patient declined  Intimate Partner Violence: Not At Risk (03/12/2021)   Humiliation, Afraid, Rape, and Kick questionnaire    Fear of Current or  Ex-Partner: No    Emotionally Abused: No    Physically Abused: No    Sexually Abused: No    Outpatient Medications Prior to Visit  Medication Sig Dispense Refill   dicyclomine (BENTYL) 10 MG capsule Take 1 capsule (10 mg total) by mouth 4 (four) times daily -  before meals and at bedtime for 7 days. 28 capsule 0   omeprazole (PRILOSEC OTC) 20 MG tablet Take 2 tablets (40 mg total) by mouth daily for 28 days. 56 tablet 0   valACYclovir (VALTREX) 500 MG tablet Take 1 tablet (500 mg total) by mouth daily. 90 tablet 3   No facility-administered medications prior to visit.      ROS:  Review of Systems BREAST: No symptoms   OBJECTIVE:   Vitals:  There were no vitals taken for this visit.  Physical Exam  Results: No results found for  this or any previous visit (from the past 24 hour(s)).   Assessment/Plan: No diagnosis found.    No orders of the defined types were placed in this encounter.     No follow-ups on file.  Curtez Brallier B. Abrar Bilton, PA-C 09/09/2022 9:33 PM

## 2022-09-10 ENCOUNTER — Encounter: Payer: Self-pay | Admitting: Obstetrics and Gynecology

## 2022-09-10 ENCOUNTER — Ambulatory Visit: Payer: Medicaid Other | Admitting: Obstetrics and Gynecology

## 2022-09-10 ENCOUNTER — Other Ambulatory Visit: Payer: Self-pay | Admitting: Obstetrics and Gynecology

## 2022-09-10 VITALS — BP 100/62 | Ht 61.0 in | Wt 185.0 lb

## 2022-09-10 DIAGNOSIS — Z3202 Encounter for pregnancy test, result negative: Secondary | ICD-10-CM | POA: Diagnosis not present

## 2022-09-10 DIAGNOSIS — R399 Unspecified symptoms and signs involving the genitourinary system: Secondary | ICD-10-CM | POA: Diagnosis not present

## 2022-09-10 DIAGNOSIS — N926 Irregular menstruation, unspecified: Secondary | ICD-10-CM

## 2022-09-10 LAB — POCT URINALYSIS DIPSTICK
Bilirubin, UA: NEGATIVE
Blood, UA: NEGATIVE
Glucose, UA: NEGATIVE
Ketones, UA: NEGATIVE
Leukocytes, UA: NEGATIVE
Nitrite, UA: NEGATIVE
Protein, UA: NEGATIVE
Spec Grav, UA: 1.02 (ref 1.010–1.025)
pH, UA: 5 (ref 5.0–8.0)

## 2022-09-10 LAB — POCT URINE PREGNANCY: Preg Test, Ur: NEGATIVE

## 2022-09-10 NOTE — Addendum Note (Signed)
Addended by: Althea Grimmer B on: 09/10/2022 05:17 PM   Modules accepted: Orders

## 2022-09-11 LAB — BETA HCG QUANT (REF LAB): hCG Quant: <1 m[IU]/mL

## 2022-09-12 LAB — URINE CULTURE

## 2022-10-06 ENCOUNTER — Ambulatory Visit (INDEPENDENT_AMBULATORY_CARE_PROVIDER_SITE_OTHER): Payer: Medicaid Other

## 2022-10-06 VITALS — BP 102/70 | HR 72 | Ht 61.0 in | Wt 178.4 lb

## 2022-10-06 DIAGNOSIS — Z3200 Encounter for pregnancy test, result unknown: Secondary | ICD-10-CM

## 2022-10-06 DIAGNOSIS — Z3201 Encounter for pregnancy test, result positive: Secondary | ICD-10-CM | POA: Diagnosis not present

## 2022-10-06 DIAGNOSIS — N912 Amenorrhea, unspecified: Secondary | ICD-10-CM

## 2022-10-06 LAB — POCT URINE PREGNANCY: Preg Test, Ur: POSITIVE — AB

## 2022-10-06 NOTE — Progress Notes (Signed)
NURSE VISIT NOTE  Subjective:    Patient ID: ERSEL TAHIR, female    DOB: 09-02-1990, 32 y.o.   MRN: [id number]  HPI  Patient is a 32 y.o. [phone] female who presents for evaluation of amenorrhea. She believes she could be pregnant. Pregnancy is desired. Sexual Activity: single partner, contraception: none. Current symptoms also include: positive home pregnancy test. Last period was normal.    Objective:    BP 102/70   Pulse 72   Ht 5\' 1"  (1.549 m)   Wt 178 lb 6.4 oz (80.9 kg)   LMP 09/01/2022 (Exact Date)   BMI 33.71 kg/m   Lab Review  Results for orders placed or performed in visit on 10/06/22  POCT urine pregnancy  Result Value Ref Range   Preg Test, Ur Positive (A) Negative    Assessment:   1. Possible pregnancy, not yet confirmed     Plan:   Pregnancy Test: Positive  Estimated Date of Delivery: 06/08/23 BP Cuff Measurement taken. Cuff Size Adult Long Encouraged well-balanced diet, plenty of rest when needed, pre-natal vitamins daily and walking for exercise.  Discussed self-help for nausea, avoiding OTC medications until consulting provider or pharmacist, other than Tylenol as needed, minimal caffeine (1-2 cups daily) and avoiding alcohol.   She will schedule her nurse visit @ 7-[redacted] wks pregnant, u/s for dating @10  wk, and NOB visit at [redacted] wk pregnant.    Feel free to call with any questions.     Loman Chroman, CMA

## 2022-10-06 NOTE — Patient Instructions (Signed)
First Trimester of Pregnancy  The first trimester of pregnancy starts on the first day of your last menstrual period until the end of week 12. This is also called months 1 through 3 of pregnancy. Body changes during your first trimester Your body goes through many changes during pregnancy. The changes usually return to normal after your baby is born. Physical changes You may gain or lose weight. Your breasts may grow larger and hurt. The area around your nipples may get darker. Dark spots or blotches may develop on your face. You may have changes in your hair. Health changes You may feel like you might vomit (nauseous), and you may vomit. You may have heartburn. You may have headaches. You may have trouble pooping (constipation). Your gums may bleed. Other changes You may get tired easily. You may pee (urinate) more often. Your menstrual periods will stop. You may not feel hungry. You may want to eat certain kinds of food. You may have changes in your emotions from day to day. You may have more dreams. Follow these instructions at home: Medicines Take over-the-counter and prescription medicines only as told by your doctor. Some medicines are not safe during pregnancy. Take a prenatal vitamin that contains at least 600 micrograms (mcg) of folic acid. Eating and drinking Eat healthy meals that include: Fresh fruits and vegetables. Whole grains. Good sources of protein, such as meat, eggs, or tofu. Low-fat dairy products. Avoid raw meat and unpasteurized juice, milk, and cheese. If you feel like you may vomit, or you vomit: Eat 4 or 5 small meals a day instead of 3 large meals. Try eating a few soda crackers. Drink liquids between meals instead of during meals. You may need to take these actions to prevent or treat trouble pooping: Drink enough fluids to keep your pee (urine) pale yellow. Eat foods that are high in fiber. These include beans, whole grains, and fresh fruits and  vegetables. Limit foods that are high in fat and sugar. These include fried or sweet foods. Activity Exercise only as told by your doctor. Most people can do their usual exercise routine during pregnancy. Stop exercising if you have cramps or pain in your lower belly (abdomen) or low back. Do not exercise if it is too hot or too humid, or if you are in a place of great height (high altitude). Avoid heavy lifting. If you choose to, you may have sex unless your doctor tells you not to. Relieving pain and discomfort Wear a good support bra if your breasts are sore. Rest with your legs raised (elevated) if you have leg cramps or low back pain. If you have bulging veins (varicose veins) in your legs: Wear support hose as told by your doctor. Raise your feet for 15 minutes, 3-4 times a day. Limit salt in your food. Safety Wear your seat belt at all times when you are in a car. Talk with your doctor if someone is hurting you or yelling at you. Talk with your doctor if you are feeling sad or have thoughts of hurting yourself. Lifestyle Do not use hot tubs, steam rooms, or saunas. Do not douche. Do not use tampons or scented sanitary pads. Do not use herbal medicines, illegal drugs, or medicines that are not approved by your doctor. Do not drink alcohol. Do not smoke or use any products that contain nicotine or tobacco. If you need help quitting, ask your doctor. Avoid cat litter boxes and soil that is used by cats. These carry  germs that can cause harm to the baby and can cause a loss of your baby by miscarriage or stillbirth. General instructions Keep all follow-up visits. This is important. Ask for help if you need counseling or if you need help with nutrition. Your doctor can give you advice or tell you where to go for help. Visit your dentist. At home, brush your teeth with a soft toothbrush. Floss gently. Write down your questions. Take them to your prenatal visits. Where to find more  information American Pregnancy Association: americanpregnancy.org [employer]: www.acog.org Office on Women's Health: Might[employer].nz Contact a doctor if: You are dizzy. You have a fever. You have mild cramps or pressure in your lower belly. You have a nagging pain in your belly area. You continue to feel like you may vomit, you vomit, or you have watery poop (diarrhea) for 24 hours or longer. You have a bad-smelling fluid coming from your vagina. You have pain when you pee. You are exposed to a disease that spreads from person to person, such as chickenpox, measles, Zika virus, HIV, or hepatitis. Get help right away if: You have spotting or bleeding from your vagina. You have very bad belly cramping or pain. You have shortness of breath or chest pain. You have any kind of injury, such as from a fall or a car crash. You have new or increased pain, swelling, or redness in an arm or leg. Summary The first trimester of pregnancy starts on the first day of your last menstrual period until the end of week 12 (months 1 through 3). Eat 4 or 5 small meals a day instead of 3 large meals. Do not smoke or use any products that contain nicotine or tobacco. If you need help quitting, ask your doctor. Keep all follow-up visits. This information is not intended to replace advice given to you by your health care provider. Make sure you discuss any questions you have with your health care provider. Document Revised: 06/07/2019 Document Reviewed: 04/13/2019 Elsevier Patient Education  2024 Elsevier Inc. Commonly Asked Questions During Pregnancy  Cats: A parasite can be excreted in cat feces.  To avoid exposure you need to have another person empty the little box.  If you must empty the litter box you will need to wear gloves.  Wash your hands after handling your cat.  This parasite can also be found in raw or undercooked meat so this should also be  avoided.  Colds, Sore Throats, Flu: Please check your medication sheet to see what you can take for symptoms.  If your symptoms are unrelieved by these medications please call the office.  Dental Work: Most any dental work Agricultural consultant recommends is permitted.  X-rays should only be taken during the first trimester if absolutely necessary.  Your abdomen should be shielded with a lead apron during all x-rays.  Please notify your provider prior to receiving any x-rays.  Novocaine is fine; gas is not recommended.  If your dentist requires a note from [place] prior to dental work please call the office and we will provide one for you.  Exercise: Exercise is an important part of staying healthy during your pregnancy.  You may continue most exercises you were accustomed to prior to pregnancy.  Later in your pregnancy you will most likely notice you have difficulty with activities requiring balance like riding a bicycle.  It is important that you listen to your body and avoid activities that put you at a higher  risk of falling.  Adequate rest and staying well hydrated are a must!  If you have questions about the safety of specific activities ask your provider.    Exposure to Children with illness: Try to avoid obvious exposure; report any symptoms to [place] when noted,  If you have chicken pos, red measles or mumps, you should be immune to these diseases.   Please do not take any vaccines while pregnant unless you have checked with your OB provider.  Fetal Movement: After 28 weeks we recommend you do "kick counts" twice daily.  Lie or sit down in a calm quiet environment and count your baby movements "kicks".  You should feel your baby at least 10 times per hour.  If you have not felt 10 kicks within the first hour get up, walk around and have something sweet to eat or drink then repeat for an additional hour.  If count remains less than 10 per hour notify your provider.  Fumigating: Follow your pest control agent's  advice as to how long to stay out of your home.  Ventilate the area well before re-entering.  Hemorrhoids:   Most over-the-counter preparations can be used during pregnancy.  Check your medication to see what is safe to use.  It is important to use a stool softener or fiber in your diet and to drink lots of liquids.  If hemorrhoids seem to be getting worse please call the office.   Hot Tubs:  Hot tubs Jacuzzis and saunas are not recommended while pregnant.  These increase your internal body temperature and should be avoided.  Intercourse:  Sexual intercourse is safe during pregnancy as long as you are comfortable, unless otherwise advised by your provider.  Spotting may occur after intercourse; report any bright red bleeding that is heavier than spotting.  Labor:  If you know that you are in labor, please go to the hospital.  If you are unsure, please call the office and let us help you decide what to do.  Lifting, straining, etc:  If your job requires heavy lifting or straining please check with your provider for any limitations.  Generally, you should not lift items heavier than that you can lift simply with your hands and arms (no back muscles)  Painting:  Paint fumes do not harm your pregnancy, but may make you ill and should be avoided if possible.  Latex or water based paints have less odor than oils.  Use adequate ventilation while painting.  Permanents & Hair Color:  Chemicals in hair dyes are not recommended as they cause increase hair dryness which can increase hair loss during pregnancy.  " Highlighting" and permanents are allowed.  Dye may be absorbed differently and permanents may not hold as well during pregnancy.  Sunbathing:  Use a sunscreen, as skin burns easily during pregnancy.  Drink plenty of fluids; avoid over heating.  Tanning Beds:  Because their possible side effects are still unknown, tanning beds are not recommended.  Ultrasound Scans:  Routine ultrasounds are performed  at approximately 20 weeks.  You will be able to see your baby's general anatomy an if you would like to know the gender this can usually be determined as well.  If it is questionable when you conceived you may also receive an ultrasound early in your pregnancy for dating purposes.  Otherwise ultrasound exams are not routinely performed unless there is a medical necessity.  Although you can request a scan we ask that you pay for it when  conducted because insurance does not cover " patient request" scans.  Work: If your pregnancy proceeds without complications you may work until your due date, unless your physician or employer advises otherwise.  Round Ligament Pain/Pelvic Discomfort:  Sharp, shooting pains not associated with bleeding are fairly common, usually occurring in the second trimester of pregnancy.  They tend to be worse when standing up or when you remain standing for long periods of time.  These are the result of pressure of certain pelvic ligaments called "round ligaments".  Rest, Tylenol and heat seem to be the most effective relief.  As the womb and fetus grow, they rise out of the pelvis and the discomfort improves.  Please notify the office if your pain seems different than that described.  It may represent a more serious condition.  Common Medications Safe in Pregnancy  Acne:      Constipation:  Benzoyl Peroxide     Colace  Clindamycin      Dulcolax Suppository  Topica Erythromycin     Fibercon  Salicylic Acid      Metamucil         Miralax AVOID:        Senakot   Accutane    Cough:  Retin-A       Cough Drops  Tetracycline      Phenergan w/ Codeine if Rx  Minocycline      Robitussin (Plain & DM)  Antibiotics:     Crabs/Lice:  Ceclor       RID  Cephalosporins    AVOID:  E-Mycins      Kwell  Keflex  Macrobid/Macrodantin   Diarrhea:  Penicillin      Kao-Pectate  Zithromax      Imodium AD         PUSH FLUIDS AVOID:       Cipro     Fever:  Tetracycline      Tylenol (Regular  or Extra  Minocycline       Strength)  Levaquin      Extra Strength-Do not          Exceed 8 tabs/24 hrs Caffeine:        200mg /day (equiv. To 1 cup of coffee or  approx. 3 12 oz sodas)         Gas: Cold/Hayfever:       Gas-X  Benadryl      Mylicon  Claritin       Phazyme  **Claritin-D        Chlor-Trimeton    Headaches:  Dimetapp      ASA-Free Excedrin  Drixoral-Non-Drowsy     Cold Compress  Mucinex (Guaifenasin)     Tylenol (Regular or Extra  Sudafed/Sudafed-12 Hour     Strength)  **Sudafed PE Pseudoephedrine   Tylenol Cold & Sinus     Vicks Vapor Rub  Zyrtec  **AVOID if Problems With Blood Pressure         Heartburn: Avoid lying down for at least 1 hour after meals  Aciphex      Maalox     Rash:  Milk of Magnesia     Benadryl    Mylanta       1% Hydrocortisone Cream  Pepcid  Pepcid Complete   Sleep Aids:  Prevacid      Ambien   Prilosec       Benadryl  Rolaids       Chamomile Tea  Tums (Limit 4/day)     Unisom  Tylenol PM         Warm milk-add vanilla or  Hemorrhoids:       Sugar for taste  Anusol/Anusol H.C.  (RX: Analapram 2.5%)  Sugar Substitutes:  Hydrocortisone OTC     Ok in moderation  Preparation H      Tucks        Vaseline lotion applied to tissue with wiping    Herpes:     Throat:  Acyclovir      Oragel  Famvir  Valtrex     Vaccines:         Flu Shot Leg Cramps:       *Gardasil  Benadryl      Hepatitis A         Hepatitis B Nasal Spray:       Pneumovax  Saline Nasal Spray     Polio Booster         Tetanus Nausea:       Tuberculosis test or PPD  Vitamin B6 25 mg TID   AVOID:    Dramamine      *Gardasil  Emetrol       Live Poliovirus  Ginger Root 250 mg QID    MMR (measles, mumps &  High Complex Carbs @ Bedtime    rebella)  Sea Bands-Accupressure    Varicella (Chickenpox)  Unisom 1/2 tab TID     *No known complications           If received before Pain:         Known pregnancy;   Darvocet       Resume series  after  Lortab        Delivery  Percocet    Yeast:   Tramadol      Femstat  Tylenol 3      Gyne-lotrimin  Ultram       Monistat  Vicodin           MISC:         All Sunscreens           Hair Coloring/highlights          Insect Repellant's          (Including DEET)         Mystic Tans

## 2022-11-15 ENCOUNTER — Encounter: Payer: Self-pay | Admitting: Emergency Medicine

## 2022-11-15 ENCOUNTER — Other Ambulatory Visit: Payer: Self-pay

## 2022-11-15 ENCOUNTER — Emergency Department
Admission: EM | Admit: 2022-11-15 | Discharge: 2022-11-15 | Disposition: A | Discharge status: Home / Self Care | Payer: Medicaid Other | Attending: Emergency Medicine | Admitting: Emergency Medicine

## 2022-11-15 DIAGNOSIS — Z3A10 10 weeks gestation of pregnancy: Secondary | ICD-10-CM | POA: Diagnosis not present

## 2022-11-15 DIAGNOSIS — Z20822 Contact with and (suspected) exposure to covid-19: Secondary | ICD-10-CM | POA: Diagnosis not present

## 2022-11-15 DIAGNOSIS — O99511 Diseases of the respiratory system complicating pregnancy, first trimester: Secondary | ICD-10-CM | POA: Insufficient documentation

## 2022-11-15 DIAGNOSIS — M791 Myalgia, unspecified site: Secondary | ICD-10-CM | POA: Diagnosis not present

## 2022-11-15 DIAGNOSIS — J069 Acute upper respiratory infection, unspecified: Secondary | ICD-10-CM | POA: Diagnosis not present

## 2022-11-15 DIAGNOSIS — O26891 Other specified pregnancy related conditions, first trimester: Secondary | ICD-10-CM | POA: Diagnosis not present

## 2022-11-15 LAB — RESP PANEL BY RT-PCR (RSV, FLU A&B, COVID)  RVPGX2
Influenza A by PCR: NEGATIVE
Influenza B by PCR: NEGATIVE
Resp Syncytial Virus by PCR: NEGATIVE
SARS Coronavirus 2 by RT PCR: NEGATIVE

## 2022-11-15 MED ORDER — ACETAMINOPHEN 325 MG PO TABS
650.0000 mg | ORAL_TABLET | Freq: Once | ORAL | Status: AC
  Administered 2022-11-15: 650 mg via ORAL
  Filled 2022-11-15: qty 2

## 2022-11-15 NOTE — ED Triage Notes (Signed)
Pt states coming in with a cough for the last week. Pt states going to PCP and covid was negative but did not have a flu test. Pt states body aches. Pt denies tacking any medications. Pt reports being [redacted] weeks pregnant.

## 2022-11-15 NOTE — Discharge Instructions (Signed)
Your COVID/flu/RSV swab was negative.  You may continue to take Tylenol 650 mg every 6-8 hours.  Please remember to get plenty of rest, stay hydrated.  Please return for any new, worsening, or change in symptoms or other concerns.  It was a pleasure caring for you today.

## 2022-11-15 NOTE — ED Provider Notes (Signed)
[facility] Provider Note    Event Date/Time   First MD Initiated Contact with Patient 11/15/22 1009     (approximate)   History   Cough   HPI  Katelyn Mendoza is a 32 y.o. female G4, P3 currently approximately [redacted] weeks pregnant who presents today for evaluation of bodyaches, cough, nasal congestion for the past 5 days.  She is unsure of any sick contacts.  She has not had any fevers or chills.  She denies chest pain or shortness of breath.  She has not had any calf pain or leg swelling.  She has not taken anything for her symptoms.  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          Physical Exam   Triage Vital Signs: ED Triage Vitals  Encounter Vitals Group     BP 11/15/22 0937 107/63     Systolic BP Percentile --      Diastolic BP Percentile --      Pulse Rate 11/15/22 0937 89     Resp 11/15/22 0937 20     Temp 11/15/22 0937 98.9 F (37.2 C)     Temp Source 11/15/22 0937 Oral     SpO2 11/15/22 0937 94 %     Weight 11/15/22 0935 187 lb (84.8 kg)     Height 11/15/22 0935 5\' 1"  (1.549 m)     Head Circumference --      Peak Flow --      Pain Score 11/15/22 0935 0     Pain Loc --      Pain Education --      Exclude from Growth Chart --     Most recent vital signs: Vitals:   11/15/22 0937 11/15/22 1041  BP: 107/63 101/64  Pulse: 89 73  Resp: 20 18  Temp: 98.9 F (37.2 C)   SpO2: 94% 95%    Physical Exam Vitals and nursing note reviewed.  Constitutional:      General: Awake and alert. No acute distress.    Appearance: Normal appearance. The patient is normal weight.  HENT:     Head: Normocephalic and atraumatic.     Mouth: Mucous  membranes are moist.  Nasal congestion present Eyes:     General: PERRL. Normal EOMs        Right eye: No discharge.        Left eye: No discharge.     Conjunctiva/sclera: Conjunctivae normal.  Cardiovascular:     Rate and Rhythm: Normal rate and regular rhythm.     Pulses: Normal pulses.  Pulmonary:     Effort: Pulmonary effort is normal. No respiratory distress. No accessory muscle use    Breath sounds: Normal breath sounds. Lungs CTAB, no wheezes, rhonchi, or rales Abdominal:     Abdomen is soft. There is no abdominal tenderness. No rebound or guarding. No distention. Musculoskeletal:        General: No swelling. Normal range of motion.     Cervical back: Normal range of motion and neck supple.  Skin:    General: Skin is warm and dry.     Capillary Refill: Capillary refill takes less than 2 seconds.  Findings: No rash.  Neurological:     Mental Status: The patient is awake and alert.      ED Results / Procedures / Treatments   Labs (all labs ordered are listed, but only abnormal results are displayed) Labs Reviewed  RESP PANEL BY RT-PCR (RSV, FLU A&B, COVID)  [id number]     EKG     RADIOLOGY     PROCEDURES:  Critical Care performed:   Procedures   MEDICATIONS ORDERED IN ED: Medications  acetaminophen (TYLENOL) tablet 650 mg (650 mg Oral Given 11/15/22 1037)     IMPRESSION / MDM / ASSESSMENT AND PLAN / ED COURSE  I reviewed the triage vital signs and the nursing notes.   Differential diagnosis includes, but is not limited to, COVID, influenza, bronchitis, pneumonia.  No chest pain, pleurisy, lower extremity signs or symptoms of DVT to suggest PE as a source of her symptoms today.  No hemoptysis.  Symptoms of cough, runny nose, body aches are consistent with viral syndrome.  Her lungs are clear to auscultation bilaterally, no fever, I do not suspect pneumonia and therefore I feel that we can save her and her fetus chest x-ray to avoid radiation.  COVID/flu/RSV swab obtained in triage is negative.  She was treated symptomatically with Tylenol.  We discussed limited symptomatic treatment options given her pregnancy.  Recommended rest, hydration, and close outpatient follow-up with her outpatient provider.  We discussed return precautions in the meantime.  Patient understands and agrees with plan.  She was discharged in stable condition.   Patient's presentation is most consistent with acute complicated illness / injury requiring diagnostic workup.    FINAL CLINICAL IMPRESSION(S) / ED DIAGNOSES   Final diagnoses:  Viral URI with cough     Rx / DC Orders   ED Discharge Orders     None        Note:  This document was prepared using Dragon voice recognition software and may include unintentional dictation errors.   Jackelyn Hoehn, PA-C 11/15/22 1103    Janith Lima, MD 11/15/22 [phone]

## 2022-12-02 ENCOUNTER — Emergency Department
Admission: EM | Admit: 2022-12-02 | Discharge: 2022-12-02 | Discharge status: Against Medical Advice | Payer: Medicaid Other | Attending: Emergency Medicine | Admitting: Emergency Medicine

## 2022-12-02 ENCOUNTER — Other Ambulatory Visit: Payer: Self-pay

## 2022-12-02 ENCOUNTER — Encounter: Payer: Self-pay | Admitting: Emergency Medicine

## 2022-12-02 DIAGNOSIS — M549 Dorsalgia, unspecified: Secondary | ICD-10-CM | POA: Diagnosis present

## 2022-12-02 DIAGNOSIS — Z5321 Procedure and treatment not carried out due to patient leaving prior to being seen by health care provider: Secondary | ICD-10-CM | POA: Diagnosis not present

## 2022-12-02 NOTE — ED Notes (Signed)
Pt called to be taken to a treatment room. Another pt in the lobby stated that the pt told them she was leaving.

## 2022-12-02 NOTE — ED Triage Notes (Signed)
Patient to ED via Pov for back pain. States ongoing x1 week. No known injuries. NAD noted.

## 2022-12-31 ENCOUNTER — Other Ambulatory Visit: Payer: Self-pay | Admitting: Family Medicine

## 2022-12-31 DIAGNOSIS — Z3482 Encounter for supervision of other normal pregnancy, second trimester: Secondary | ICD-10-CM

## 2023-01-12 ENCOUNTER — Ambulatory Visit
Admission: RE | Admit: 2023-01-12 | Discharge: 2023-01-12 | Disposition: A | Discharge status: Home / Self Care | Payer: Medicaid Other | Source: Ambulatory Visit | Attending: Family Medicine | Admitting: Family Medicine

## 2023-01-12 ENCOUNTER — Ambulatory Visit: Admission: RE | Admit: 2023-01-12 | Payer: Medicaid Other | Source: Ambulatory Visit

## 2023-01-12 DIAGNOSIS — Z3482 Encounter for supervision of other normal pregnancy, second trimester: Secondary | ICD-10-CM | POA: Diagnosis present

## 2023-01-12 DIAGNOSIS — Z3481 Encounter for supervision of other normal pregnancy, first trimester: Secondary | ICD-10-CM | POA: Diagnosis not present

## 2023-01-12 DIAGNOSIS — Z3A10 10 weeks gestation of pregnancy: Secondary | ICD-10-CM | POA: Insufficient documentation

## 2023-01-13 NOTE — L&D Delivery Note (Signed)
 Delivery Note  MAI LONGNECKER is a [id number] at [redacted]w[redacted]d, Patient's last menstrual period was 09/02/2022 (exact date)., consistent with US  at [redacted]w[redacted]d. Estimated Date of Delivery: 06/09/23   First Stage: Labor onset: 0145 Augmentation: None Analgesia Darcie Easterly intrapartum: Epidural AROM at 0523 for meconium stained amniotic fluid  GBS: negative  Second Stage: Complete dilation at 0511 Onset of pushing at 0524 FHR second stage 145 bpm with moderate variability, variable and prolong decels with pushing.    Ivelis presented to L&D with active labor. She was expectantly managed. She was able to rest after epidural. Called to room for prolong decel, cervix found to be C/C/+1 with a bulging bag. Position changes and IVF fluid bolus given. FHR recovery in hands and knees. Assisted back to side lying and AROM performed. She pushed effectively over approximately 6 minutes for a spontaneous vaginal birth.  Delivery of a viable baby boy on 06/17/2023 at 0531 by CNM Delivery of fetal head in OA position with restitution to LOT. Loose nuchal cord x 1, easily reduced;  Anterior then posterior shoulders delivered easily with gentle downward traction. Baby placed on mom's chest, and attended to by baby RN Cord double clamped after cessation of pulsation, cut by RN per patient preference.  Cord blood sample collection: No A POS  Third Stage: Oxytocin  bolus started after delivery of infant for hemorrhage prophylaxis  Placenta delivered via Ileana Mallard mechanism intact with 3 VC @ 0537 -Velamentous cord insertion noted Placenta disposition: discarded  Uterine tone firm / bleeding scant   Laceration: none Anesthesia for repair: N/A Suture: N/A Est. Blood Loss (mL): 100 ml   Complications: None  Mom to postpartum.  Baby to Couplet care / Skin to Skin.  Newborn: Information for the patient's newborn:  Khamiyah, Grefe [[id number]]  Live born female  Birth Weight: Pending  APGAR: 7, 9  Newborn Delivery    Birth date/time: 06/17/2023 05:31:00 Delivery type: Vaginal, Spontaneous      Feeding planned: formula feeding  ---------- Fraser Jackson, CNM Certified Nurse Midwife [facility]

## 2023-01-18 LAB — OB RESULTS CONSOLE HIV ANTIBODY (ROUTINE TESTING): HIV: NONREACTIVE

## 2023-01-18 LAB — OB RESULTS CONSOLE GC/CHLAMYDIA
Chlamydia: NEGATIVE
Neisseria Gonorrhea: NEGATIVE

## 2023-03-15 ENCOUNTER — Telehealth: Payer: Self-pay

## 2023-03-15 DIAGNOSIS — B009 Herpesviral infection, unspecified: Secondary | ICD-10-CM

## 2023-03-15 MED ORDER — VALACYCLOVIR HCL 500 MG PO TABS
500.0000 mg | ORAL_TABLET | Freq: Every day | ORAL | 2 refills | Status: DC
Start: 2023-03-15 — End: 2023-06-18

## 2023-03-15 NOTE — Telephone Encounter (Signed)
Refill sent. Patient aware.  

## 2023-03-23 ENCOUNTER — Other Ambulatory Visit: Payer: Self-pay | Admitting: Family Medicine

## 2023-03-23 DIAGNOSIS — O26849 Uterine size-date discrepancy, unspecified trimester: Secondary | ICD-10-CM

## 2023-03-31 ENCOUNTER — Ambulatory Visit: Admission: RE | Admit: 2023-03-31 | Source: Ambulatory Visit

## 2023-04-09 ENCOUNTER — Ambulatory Visit
Admission: RE | Admit: 2023-04-09 | Discharge: 2023-04-09 | Disposition: A | Discharge status: Home / Self Care | Source: Ambulatory Visit | Attending: Family Medicine | Admitting: Family Medicine

## 2023-04-09 DIAGNOSIS — O26849 Uterine size-date discrepancy, unspecified trimester: Secondary | ICD-10-CM | POA: Insufficient documentation

## 2023-05-12 ENCOUNTER — Other Ambulatory Visit: Payer: Self-pay | Admitting: Family Medicine

## 2023-05-12 DIAGNOSIS — O26849 Uterine size-date discrepancy, unspecified trimester: Secondary | ICD-10-CM

## 2023-05-12 LAB — OB RESULTS CONSOLE GC/CHLAMYDIA
Chlamydia: NEGATIVE
Neisseria Gonorrhea: NEGATIVE

## 2023-05-12 LAB — OB RESULTS CONSOLE GBS: GBS: NEGATIVE

## 2023-05-17 ENCOUNTER — Ambulatory Visit
Admission: RE | Admit: 2023-05-17 | Discharge: 2023-05-17 | Disposition: A | Discharge status: Home / Self Care | Source: Ambulatory Visit | Attending: Family Medicine | Admitting: Family Medicine

## 2023-05-17 DIAGNOSIS — O26849 Uterine size-date discrepancy, unspecified trimester: Secondary | ICD-10-CM | POA: Diagnosis present

## 2023-06-17 ENCOUNTER — Other Ambulatory Visit: Payer: Self-pay

## 2023-06-17 ENCOUNTER — Inpatient Hospital Stay: Admitting: Anesthesiology

## 2023-06-17 ENCOUNTER — Inpatient Hospital Stay
Admission: EM | Admit: 2023-06-17 | Discharge: 2023-06-18 | DRG: 806 | Disposition: A | Discharge status: Home / Self Care | Attending: Certified Nurse Midwife | Admitting: Certified Nurse Midwife

## 2023-06-17 ENCOUNTER — Encounter: Payer: Self-pay | Admitting: Obstetrics and Gynecology

## 2023-06-17 DIAGNOSIS — Z8249 Family history of ischemic heart disease and other diseases of the circulatory system: Secondary | ICD-10-CM

## 2023-06-17 DIAGNOSIS — O9902 Anemia complicating childbirth: Secondary | ICD-10-CM | POA: Diagnosis present

## 2023-06-17 DIAGNOSIS — Z833 Family history of diabetes mellitus: Secondary | ICD-10-CM

## 2023-06-17 DIAGNOSIS — O09299 Supervision of pregnancy with other poor reproductive or obstetric history, unspecified trimester: Secondary | ICD-10-CM

## 2023-06-17 DIAGNOSIS — O43123 Velamentous insertion of umbilical cord, third trimester: Secondary | ICD-10-CM | POA: Diagnosis present

## 2023-06-17 DIAGNOSIS — Z87891 Personal history of nicotine dependence: Secondary | ICD-10-CM

## 2023-06-17 DIAGNOSIS — D573 Sickle-cell trait: Secondary | ICD-10-CM | POA: Diagnosis present

## 2023-06-17 DIAGNOSIS — O9832 Other infections with a predominantly sexual mode of transmission complicating childbirth: Secondary | ICD-10-CM | POA: Diagnosis present

## 2023-06-17 DIAGNOSIS — O99019 Anemia complicating pregnancy, unspecified trimester: Secondary | ICD-10-CM | POA: Diagnosis present

## 2023-06-17 DIAGNOSIS — O480 Post-term pregnancy: Secondary | ICD-10-CM | POA: Diagnosis present

## 2023-06-17 DIAGNOSIS — A6000 Herpesviral infection of urogenital system, unspecified: Secondary | ICD-10-CM | POA: Diagnosis present

## 2023-06-17 DIAGNOSIS — O99214 Obesity complicating childbirth: Secondary | ICD-10-CM | POA: Diagnosis present

## 2023-06-17 DIAGNOSIS — Z8619 Personal history of other infectious and parasitic diseases: Secondary | ICD-10-CM | POA: Diagnosis present

## 2023-06-17 DIAGNOSIS — Z3A41 41 weeks gestation of pregnancy: Secondary | ICD-10-CM

## 2023-06-17 LAB — URINE DRUG SCREEN, QUALITATIVE (ARMC ONLY)
Amphetamines, Ur Screen: NOT DETECTED
Barbiturates, Ur Screen: NOT DETECTED
Benzodiazepine, Ur Scrn: NOT DETECTED
Cannabinoid 50 Ng, Ur ~~LOC~~: NOT DETECTED
Cocaine Metabolite,Ur ~~LOC~~: NOT DETECTED
MDMA (Ecstasy)Ur Screen: NOT DETECTED
Methadone Scn, Ur: NOT DETECTED
Opiate, Ur Screen: NOT DETECTED
Phencyclidine (PCP) Ur S: NOT DETECTED
Tricyclic, Ur Screen: NOT DETECTED

## 2023-06-17 LAB — HEPATITIS B SURFACE ANTIGEN: Hepatitis B Surface Ag: NONREACTIVE

## 2023-06-17 LAB — TYPE AND SCREEN
ABO/RH(D): A POS
Antibody Screen: NEGATIVE

## 2023-06-17 LAB — CBC
HCT: 36.7 % (ref 36.0–46.0)
Hemoglobin: 12.8 g/dL (ref 12.0–15.0)
MCH: 26.4 pg (ref 26.0–34.0)
MCHC: 34.9 g/dL (ref 30.0–36.0)
MCV: 75.7 fL — ABNORMAL LOW (ref 80.0–100.0)
Platelets: 297 10*3/uL (ref 150–400)
RBC: 4.85 MIL/uL (ref 3.87–5.11)
RDW: 16 % — ABNORMAL HIGH (ref 11.5–15.5)
WBC: 8.6 10*3/uL (ref 4.0–10.5)
nRBC: 0 % (ref 0.0–0.2)

## 2023-06-17 LAB — RPR: RPR Ser Ql: NONREACTIVE

## 2023-06-17 MED ORDER — ZOLPIDEM TARTRATE 5 MG PO TABS: 5.0000 mg | ORAL_TABLET | Freq: Every evening | ORAL | Status: DC | PRN

## 2023-06-17 MED ORDER — LIDOCAINE HCL (PF) 1 % IJ SOLN
30.0000 mL | INTRAMUSCULAR | Status: DC | PRN
  Filled 2023-06-17: qty 30

## 2023-06-17 MED ORDER — ONDANSETRON HCL 4 MG PO TABS: 4.0000 mg | ORAL_TABLET | ORAL | Status: DC | PRN

## 2023-06-17 MED ORDER — DIPHENHYDRAMINE HCL 50 MG/ML IJ SOLN: 12.5000 mg | INTRAMUSCULAR | Status: DC | PRN

## 2023-06-17 MED ORDER — LACTATED RINGERS IV SOLN
500.0000 mL | Freq: Once | INTRAVENOUS | Status: AC
  Administered 2023-06-17: 500 mL via INTRAVENOUS

## 2023-06-17 MED ORDER — SODIUM CHLORIDE 0.9% FLUSH: 3.0000 mL | INTRAVENOUS | Status: DC | PRN

## 2023-06-17 MED ORDER — IBUPROFEN 600 MG PO TABS
600.0000 mg | ORAL_TABLET | Freq: Four times a day (QID) | ORAL | Status: DC
  Administered 2023-06-17 – 2023-06-18 (×5): 600 mg via ORAL
  Filled 2023-06-17 (×5): qty 1

## 2023-06-17 MED ORDER — SODIUM CHLORIDE 0.9 % IV SOLN: 250.0000 mL | INTRAVENOUS | Status: DC | PRN

## 2023-06-17 MED ORDER — SIMETHICONE 80 MG PO CHEW: 80.0000 mg | CHEWABLE_TABLET | ORAL | Status: DC | PRN

## 2023-06-17 MED ORDER — OXYTOCIN 10 UNIT/ML IJ SOLN
INTRAMUSCULAR | Status: AC
  Filled 2023-06-17: qty 2

## 2023-06-17 MED ORDER — EPHEDRINE 5 MG/ML INJ: 10.0000 mg | INTRAVENOUS | Status: DC | PRN

## 2023-06-17 MED ORDER — AMMONIA AROMATIC IN INHA
RESPIRATORY_TRACT | Status: AC
  Filled 2023-06-17: qty 10

## 2023-06-17 MED ORDER — WITCH HAZEL-GLYCERIN EX PADS
1.0000 | MEDICATED_PAD | CUTANEOUS | Status: DC | PRN
  Filled 2023-06-17: qty 100

## 2023-06-17 MED ORDER — LIDOCAINE-EPINEPHRINE (PF) 1.5 %-1:200000 IJ SOLN
INTRAMUSCULAR | Status: DC | PRN
  Administered 2023-06-17: 3 mL via EPIDURAL

## 2023-06-17 MED ORDER — MISOPROSTOL 200 MCG PO TABS
ORAL_TABLET | ORAL | Status: AC
  Filled 2023-06-17: qty 4

## 2023-06-17 MED ORDER — DIBUCAINE (PERIANAL) 1 % EX OINT: 1.0000 | TOPICAL_OINTMENT | CUTANEOUS | Status: DC | PRN

## 2023-06-17 MED ORDER — ACETAMINOPHEN 500 MG PO TABS: 1000.0000 mg | ORAL_TABLET | Freq: Four times a day (QID) | ORAL | Status: DC | PRN

## 2023-06-17 MED ORDER — FERROUS SULFATE 325 (65 FE) MG PO TABS
325.0000 mg | ORAL_TABLET | Freq: Two times a day (BID) | ORAL | Status: DC
  Administered 2023-06-17 – 2023-06-18 (×2): 325 mg via ORAL
  Filled 2023-06-17 (×2): qty 1

## 2023-06-17 MED ORDER — FENTANYL-BUPIVACAINE-NACL 0.5-0.125-0.9 MG/250ML-% EP SOLN
12.0000 mL/h | EPIDURAL | Status: DC | PRN
  Administered 2023-06-17: 12 mL/h via EPIDURAL

## 2023-06-17 MED ORDER — PRENATAL MULTIVITAMIN CH
1.0000 | ORAL_TABLET | Freq: Every day | ORAL | Status: DC
  Administered 2023-06-17 – 2023-06-18 (×2): 1 via ORAL
  Filled 2023-06-17 (×2): qty 1

## 2023-06-17 MED ORDER — FENTANYL-BUPIVACAINE-NACL 0.5-0.125-0.9 MG/250ML-% EP SOLN
EPIDURAL | Status: AC
Start: 2023-06-17 — End: 2023-06-17
  Filled 2023-06-17: qty 250

## 2023-06-17 MED ORDER — COCONUT OIL OIL: 1.0000 | TOPICAL_OIL | Status: DC | PRN

## 2023-06-17 MED ORDER — OXYTOCIN BOLUS FROM INFUSION
333.0000 mL | Freq: Once | INTRAVENOUS | Status: AC
  Administered 2023-06-17: 333 mL via INTRAVENOUS

## 2023-06-17 MED ORDER — DIPHENHYDRAMINE HCL 25 MG PO CAPS: 25.0000 mg | ORAL_CAPSULE | Freq: Four times a day (QID) | ORAL | Status: DC | PRN

## 2023-06-17 MED ORDER — PHENYLEPHRINE 80 MCG/ML (10ML) SYRINGE FOR IV PUSH (FOR BLOOD PRESSURE SUPPORT)
80.0000 ug | PREFILLED_SYRINGE | INTRAVENOUS | Status: DC | PRN
Start: 2023-06-17 — End: 2023-06-17

## 2023-06-17 MED ORDER — ONDANSETRON HCL 4 MG/2ML IJ SOLN: 4.0000 mg | INTRAMUSCULAR | Status: DC | PRN

## 2023-06-17 MED ORDER — SENNOSIDES-DOCUSATE SODIUM 8.6-50 MG PO TABS
2.0000 | ORAL_TABLET | Freq: Every day | ORAL | Status: DC
  Administered 2023-06-18: 2 via ORAL
  Filled 2023-06-17: qty 2

## 2023-06-17 MED ORDER — PHENYLEPHRINE 80 MCG/ML (10ML) SYRINGE FOR IV PUSH (FOR BLOOD PRESSURE SUPPORT): 80.0000 ug | PREFILLED_SYRINGE | INTRAVENOUS | Status: DC | PRN

## 2023-06-17 MED ORDER — SOD CITRATE-CITRIC ACID 500-334 MG/5ML PO SOLN: 30.0000 mL | ORAL | Status: DC | PRN

## 2023-06-17 MED ORDER — FENTANYL CITRATE (PF) 100 MCG/2ML IJ SOLN
50.0000 ug | INTRAMUSCULAR | Status: DC | PRN
  Administered 2023-06-17: 100 ug via INTRAVENOUS
  Filled 2023-06-17: qty 2

## 2023-06-17 MED ORDER — SODIUM CHLORIDE 0.9% FLUSH: 3.0000 mL | Freq: Two times a day (BID) | INTRAVENOUS | Status: DC

## 2023-06-17 MED ORDER — ACETAMINOPHEN 500 MG PO TABS
1000.0000 mg | ORAL_TABLET | Freq: Four times a day (QID) | ORAL | Status: DC
  Administered 2023-06-17 – 2023-06-18 (×5): 1000 mg via ORAL
  Filled 2023-06-17 (×5): qty 2

## 2023-06-17 MED ORDER — BENZOCAINE-MENTHOL 20-0.5 % EX AERO
1.0000 | INHALATION_SPRAY | CUTANEOUS | Status: DC | PRN
  Administered 2023-06-17: 1 via TOPICAL
  Filled 2023-06-17 (×2): qty 56

## 2023-06-17 MED ORDER — OXYTOCIN-SODIUM CHLORIDE 30-0.9 UT/500ML-% IV SOLN
2.5000 [IU]/h | INTRAVENOUS | Status: DC
  Administered 2023-06-17: 2.5 [IU]/h via INTRAVENOUS
  Filled 2023-06-17: qty 500

## 2023-06-17 MED ORDER — LACTATED RINGERS IV SOLN: INTRAVENOUS | Status: DC

## 2023-06-17 MED ORDER — ONDANSETRON HCL 4 MG/2ML IJ SOLN: 4.0000 mg | Freq: Four times a day (QID) | INTRAMUSCULAR | Status: DC | PRN

## 2023-06-17 MED ORDER — BUPIVACAINE HCL (PF) 0.25 % IJ SOLN
INTRAMUSCULAR | Status: DC | PRN
  Administered 2023-06-17: 8 mL via EPIDURAL

## 2023-06-17 MED ORDER — LACTATED RINGERS IV SOLN: 500.0000 mL | INTRAVENOUS | Status: DC | PRN

## 2023-06-17 NOTE — Discharge Summary (Signed)
 Postpartum Discharge Summary  Patient Name: Katelyn Mendoza DOB: 03/19/1990 MRN: [id number]  Date of admission: 06/17/2023 Delivery date:06/17/2023 Delivering provider: MACKIE, ANNA M Date of discharge: 06/18/2023  Primary OB: [facility] [id number] last menstrual period was 09/02/2022 (exact date). EDC Estimated Date of Delivery: 06/09/23 Gestational Age at Delivery: [redacted]w[redacted]d   Admitting diagnosis: Normal labor [O80, Z37.9] Intrauterine pregnancy: 107w1d     Secondary diagnosis:   Principal Problem:   NSVD (normal spontaneous vaginal delivery) Active Problems:   Sickle cell trait in mother affecting pregnancy (HCC)   Normal labor   History of ELISA positive for HSV   Hx of preeclampsia, prior pregnancy, currently pregnant   History of postpartum hemorrhage, currently pregnant   Discharge Diagnosis: Term Pregnancy Delivered      Hospital course: Onset of Labor With Vaginal Delivery      33 y.o. yo [id number] at [redacted]w[redacted]d was admitted in Active Labor on 06/17/2023. Labor course was uncomplicated. [name] presented to L&D with active labor. She was expectantly managed. She was able to rest after epidural. Called to room for prolong decel, cervix found to be C/C/+1 with a bulging bag. Position changes and IVF fluid bolus given. FHR recovery in hands and knees. Assisted back to side lying and AROM performed. She pushed effectively over approximately 6 minutes for a spontaneous vaginal birth.  Membrane Rupture Time/Date: 5:23 AM,06/17/2023  Delivery Method:Vaginal, Spontaneous Operative Delivery:N/A Episiotomy: None Lacerations:  None Patient had a postpartum course complicated by none.  She is ambulating, tolerating a regular diet, passing flatus, and urinating well. Patient is discharged home in stable condition on 06/18/23.  Newborn Data: Birth date:06/17/2023 Birth time:5:31 AM Gender:Female Living status:Living Apgars:7 ,9  Weight:3280 g                                            Post partum  procedures:none Augmentation:: N/A Complications: None Delivery Type: spontaneous vaginal delivery Anesthesia: epidural anesthesia Placenta: spontaneous To Pathology: No  -Velamentous cord insertion noted   Prenatal Labs:  Blood type/Rh A POS Performed at [facility], [address] [place], [place] [address]    Antibody screen Negative    Rubella Immune  10/16/2022  Varicella Immune 07/08/2018  RPR NR 10/26/2022    HBsAg Neg 12/01/2021   Hep C NR 04/21/2022  HIV Neg 01/18/2023    GC neg  Chlamydia neg  Genetic screening Unknown   1 hour GTT 126  3 hour GTT N/A  GBS Neg 05/14/2023      Magnesium  Sulfate received: No BMZ received: No Rhophylac:was not indicated MMR: was not indicated Varivax vaccine given: was not indicated - Tdap vaccine: Given prenatally - Flu vaccine: declined  -RSV vaccine: not in season   Transfusion:No  Physical exam  Vitals:   06/17/23 2024 06/18/23 0058 06/18/23 0403 06/18/23 0735  BP: 120/82 121/78 131/83 117/74  Pulse: 83 79 80 81  Resp: 18 20 20 19   Temp: (!) 97.5 F (36.4 C) 98.4 F (36.9 C) 97.7 F (36.5 C) 97.9 F (36.6 C)  TempSrc: Oral  Oral Oral  SpO2: 99% 99% 99% 99%  Weight:      Height:       General: alert, cooperative, and no distress Lochia: appropriate Uterine Fundus: firm Perineum: minimal edema/intact DVT Evaluation: No evidence of DVT seen on physical exam.  Labs: Lab Results  Component Value Date  WBC 8.8 06/18/2023   HGB 11.6 (L) 06/18/2023   HCT 34.7 (L) 06/18/2023   MCV 78.0 (L) 06/18/2023   PLT 277 06/18/2023      Latest Ref Rng & Units 06/11/2022    3:59 PM  CMP  Glucose 70 - 99 mg/dL 657   BUN 6 - 20 mg/dL 13   Creatinine 8.46 - 1.00 mg/dL 9.62   Sodium 952 - 841 mmol/L 137   Potassium 3.5 - 5.1 mmol/L 3.2   Chloride 98 - 111 mmol/L 104   CO2 22 - 32 mmol/L 25   Calcium  8.9 - 10.3 mg/dL 9.0   Total Protein 6.5 - 8.1 g/dL 7.5   Total Bilirubin 0.3 - 1.2 mg/dL 0.6   Alkaline Phos 38  - 126 U/L 57   AST 15 - 41 U/L 23   ALT 0 - 44 U/L 20    Edinburgh Score:    01/23/2019   11:48 AM  Edinburgh Postnatal Depression Scale Screening Tool  I have been able to laugh and see the funny side of things. 0  I have looked forward with enjoyment to things. 1  I have blamed myself unnecessarily when things went wrong. 2  I have been anxious or worried for no good reason. 2  I have felt scared or panicky for no good reason. 2  Things have been getting on top of me. 2  I have been so unhappy that I have had difficulty sleeping. 1  I have felt sad or miserable. 1  I have been so unhappy that I have been crying. 1  The thought of harming myself has occurred to me. 0  Edinburgh Postnatal Depression Scale Total 12    Risk assessment for postpartum VTE and prophylactic treatment: Very high risk factors: None High risk factors: None Moderate risk factors: BMI 30-40 kg/m2  Postpartum VTE prophylaxis with LMWH not indicated  After visit meds:  Allergies as of 06/18/2023   No Known Allergies      Medication List     STOP taking these medications    valACYclovir  500 MG tablet Commonly known as: VALTREX        TAKE these medications    WesTab Plus 27-1 MG Tabs Take 1 tablet by mouth daily.       Discharge home in stable condition Infant Feeding: Bottle Infant Disposition:home with mother Discharge instruction: per After Visit Summary and Postpartum booklet. Activity: Advance as tolerated. Pelvic rest for 6 weeks.  Diet: routine diet Anticipated Birth Control:  Contraceptives: TBD Postpartum Appointment:6 weeks Additional Postpartum F/U: Postpartum Depression checkup Future Appointments:No future appointments. Follow up Visit:  Follow-up Information     Center, [facility]. Schedule an appointment as soon as possible for a visit in 2 week(s).   Specialty: General Practice Why: postpartum mood check Contact information: [address] [place] [address] [phone]                 Plan:  [name] was discharged to home in good condition. Follow-up appointment as directed.    Signed: Jenifer E Christianjames Soule  06/18/2023 9:25 AM

## 2023-06-17 NOTE — Discharge Instructions (Signed)
 Vaginal Delivery, Care After Refer to this sheet in the next few weeks. These discharge instructions provide you with information on caring for yourself after delivery. Your caregiver may also give you specific instructions. Your treatment has been planned according to the most current medical practices available, but problems sometimes occur. Call your caregiver if you have any problems or questions after you go home. HOME CARE INSTRUCTIONS Take over-the-counter or prescription medicines only as directed by your caregiver or pharmacist. Do not drink alcohol, especially if you are breastfeeding or taking medicine to relieve pain. Do not smoke tobacco. Continue to use good perineal care. Good perineal care includes: Wiping your perineum from back to front Keeping your perineum clean. You can do sitz baths twice a day, to help keep this area clean Do not use tampons, douche or have sex until your caregiver says it is okay. Shower only and avoid sitting in submerged water, aside from sitz baths Wear a well-fitting bra that provides breast support. Eat healthy foods. Drink enough fluids to keep your urine clear or pale yellow. Eat high-fiber foods such as whole grain cereals and breads, brown rice, beans, and fresh fruits and vegetables every day. These foods may help prevent or relieve constipation. Avoid constipation with high fiber foods or medications, such as miralax or metamucil Follow your caregiver's recommendations regarding resumption of activities such as climbing stairs, driving, lifting, exercising, or traveling. Talk to your caregiver about resuming sexual activities. Resumption of sexual activities is dependent upon your risk of infection, your rate of healing, and your comfort and desire to resume sexual activity. Try to have someone help you with your household activities and your newborn for at least a few days after you leave the hospital. Rest as much as possible. Try to rest or  take a nap when your newborn is sleeping. Increase your activities gradually. Keep all of your scheduled postpartum appointments. It is very important to keep your scheduled follow-up appointments. At these appointments, your caregiver will be checking to make sure that you are healing physically and emotionally. SEEK MEDICAL CARE IF:  You are passing large clots from your vagina. Save any clots to show your caregiver. You have a foul smelling discharge from your vagina. You have trouble urinating. You are urinating frequently. You have pain when you urinate. You have a change in your bowel movements. You have increasing redness, pain, or swelling near your vaginal incision (episiotomy) or vaginal tear. You have pus draining from your episiotomy or vaginal tear. Your episiotomy or vaginal tear is separating. You have painful, hard, or reddened breasts. You have a severe headache. You have blurred vision or see spots. You feel sad or depressed. You have thoughts of hurting yourself or your newborn. You have questions about your care, the care of your newborn, or medicines. You are dizzy or light-headed. You have a rash. You have nausea or vomiting. You were breastfeeding and have not had a menstrual period within 12 weeks after you stopped breastfeeding. You are not breastfeeding and have not had a menstrual period by the 12th week after delivery. You have a fever. SEEK IMMEDIATE MEDICAL CARE IF:  You have persistent pain. You have chest pain. You have shortness of breath. You faint. You have leg pain. You have stomach pain. Your vaginal bleeding saturates two or more sanitary pads in 1 hour. MAKE SURE YOU:  Understand these instructions. Will watch your condition. Will get help right away if you are not doing well or  get worse. Document Released: 12/27/1999 Document Revised: 05/15/2013 Document Reviewed: 08/26/2011 [facility] Patient Information 2015 [place], [place]. This  information is not intended to replace advice given to you by your health care provider. Make sure you discuss any questions you have with your health care provider.  Sitz Bath A sitz bath is a warm water bath taken in the sitting position. The water covers only the hips and butt (buttocks). We recommend using one that fits in the toilet, to help with ease of use and cleanliness. It may be used for either healing or cleaning purposes. Sitz baths are also used to relieve pain, itching, or muscle tightening (spasms). The water may contain medicine. Moist heat will help you heal and relax.  HOME CARE  Take 3 to 4 sitz baths a day. Fill the bathtub half-full with warm water. Sit in the water and open the drain a little. Turn on the warm water to keep the tub half-full. Keep the water running constantly. Soak in the water for 15 to 20 minutes. After the sitz bath, pat the affected area dry. GET HELP RIGHT AWAY IF: You get worse instead of better. Stop the sitz baths if you get worse. MAKE SURE YOU: Understand these instructions. Will watch your condition. Will get help right away if you are not doing well or get worse. Document Released: 02/06/2004 Document Revised: 09/23/2011 Document Reviewed: 04/28/2010 [facility] Patient Information 2015 [place], [place]. This information is not intended to replace advice given to you by your health care provider. Make sure you discuss any questions you have with your health care provider.

## 2023-06-17 NOTE — OB Triage Note (Signed)
 Pt Katelyn Mendoza 33 y.o. presents to labor and delivery triage reporting  contractions that started around 0145. Pt is a [id number] at [redacted]w[redacted]d . Pt denies signs and symptons consistent with rupture of membranes or active vaginal bleeding. Pt d states positive fetal movement. External FM and TOCO applied to non-tender abdomen and assessing. Vital signs obtained and within normal limits. Provider notified of pt.

## 2023-06-17 NOTE — Anesthesia Procedure Notes (Signed)
 Epidural Patient location during procedure: OB Start time: 06/17/2023 4:01 AM End time: 06/17/2023 4:07 AM  Staffing Anesthesiologist: Dorothey Gate, MD Performed: anesthesiologist   Preanesthetic Checklist Completed: patient identified, IV checked, risks and benefits discussed, surgical consent, monitors and equipment checked, pre-op evaluation and timeout performed  Epidural Patient position: sitting Prep: ChloraPrep Patient monitoring: heart rate, continuous pulse ox and blood pressure Approach: midline Location: L2-L3 Injection technique: LOR air  Needle:  Needle type: Tuohy  Needle gauge: 17 G Needle length: 9 cm Needle insertion depth: 5.5 cm Catheter at skin depth: 10.5 cm Test dose: negative and 1.5% lidocaine  with Epi 1:200 K  Assessment Sensory level: T4  Additional Notes Straightforward placement without apparent complications. Pt declined to lay flat on back after procedure.  Advised her to lay on back for bilateral epidural coverage, but pt states she is only able to lay left lateral decubitus due to pain. Reason for block:procedure for pain

## 2023-06-17 NOTE — H&P (Addendum)
 OB History & Physical   History of Present Illness:   Chief Complaint: contractions   HPI:  Katelyn Mendoza is a 33 y.o. [phone] female at [redacted]w[redacted]d, Patient's last menstrual period was 09/02/2022 (exact date)., consistent with US  at [redacted]w[redacted]d, with Estimated Date of Delivery: 06/09/23.  She presents to L&D for painful contractions. States they started at 0145 and reporting back pain with them. She was scheduled to be induced for postdates pregnancy at [facility] this morning but came to [facility] when contractions became intense quickly. Her pregnancy is complicated by obesity, hx of HSV, hx of preeclampsia with severe features, PPH, sickle cell trait, and hx of Bipolar d/o.  She had a PPH with a QBL of 1000 ml after twin delivery. She received 2 units of blood postpartum d/t maternal anemia prior to delivery (starting hgb 9.3) and worsening postpartum anemia d/t blood loss at delivery. She denies Loss of fluid or Vaginal bleeding. Endorses fetal movement as active.   Reports active fetal movement  Contractions: every 3 to 5 minutes starting at 0145 LOF/SROM: denies  Vaginal bleeding: denies   Factors complicating pregnancy:  Principal Problem:   Normal labor Active Problems:   Sickle cell trait in mother affecting pregnancy (HCC)   History of ELISA positive for HSV   Hx of preeclampsia, prior pregnancy, currently pregnant   History of postpartum hemorrhage, currently pregnant    [facility] care [facility]  Patient Active Problem List   Diagnosis Date Noted   Normal labor 06/17/2023   History of ELISA positive for HSV 06/17/2023   Hx of preeclampsia, prior pregnancy, currently pregnant 06/17/2023   History of postpartum hemorrhage, currently pregnant 06/17/2023   Status post laparoscopic cholecystectomy 06/22/2019   Sickle cell trait in mother affecting pregnancy (HCC)    BMI 30.0-30.9,adult 07/08/2018   Bipolar I disorder (HCC) dx'd age 31 07/04/2018   HSV infection 05/10/2018    Prenatal  Transfer Tool  Maternal Diabetes: No Genetic Screening: Unknown  Maternal Ultrasounds/Referrals: Normal Fetal Ultrasounds or other Referrals:  None Maternal Substance Abuse:  No Significant Maternal Medications:  None Significant Maternal Lab Results: Group B Strep negative  Maternal Medical History:   Past Medical History:  Diagnosis Date   Gallstones    GC (gonococcus infection) 06/06/2020   05/2020 treated.  TOC 06/06/2020  Treated 07/24/20     HSV (herpes simplex virus) infection    Mental disorder    bipolar disorder dx'd at age 75   Sickle cell trait (HCC)    Spinal headache    Trichomonosis    Urinary tract infection affecting care of mother, antepartum     Past Surgical History:  Procedure Laterality Date   CHOLECYSTECTOMY  05/2019   spontaneous vaginal deliver     twins 12/20   WISDOM TOOTH EXTRACTION      No Known Allergies  Prior to Admission medications   Medication Sig Start Date End Date Taking? Authorizing Provider  Prenatal Vit-Fe Fumarate-FA (WESTAB PLUS) 27-1 MG TABS Take 1 tablet by mouth daily. 08/24/22  Yes [provider]  valACYclovir  (VALTREX ) 500 MG tablet Take 1 tablet (500 mg total) by mouth daily. Patient not taking: Reported on 06/17/2023 03/15/23   Copland, Amada Jun, PA-C    OB History  Gravida Para Term Preterm AB Living  6 2 2  0 1 3  SAB IAB Ectopic Multiple Live Births  0 1 0 1 3    # Outcome Date GA Lbr Len/2nd Weight Sex Type  Anes PTL Lv  6 Current           5A Term 12/14/18 [redacted]w[redacted]d  2440 g M Vag-Spont EPI  LIV     Name: Frenkel,BOY Lynnox     Apgar1: 8  Apgar5: 9  5B Term 12/14/18 [redacted]w[redacted]d  2190 g M Vag-Forceps EPI  LIV     Name: Mckinstry,BOYB Shaelynn     Apgar1: 6  Apgar5: 8  4 Term 02/18/15 [redacted]w[redacted]d 06:00 / 02:13 3030 g M Vag-Vacuum EPI  LIV     Birth Comments: Terminal MSAF     Name: Wroblewski,BOY Aili     Apgar1: 7  Apgar5: 9  3 IAB 2008          2 Gravida           1 Gravida              Social History: She  reports that she  quit smoking about 10 years ago. Her smoking use included cigarettes. She has never used smokeless tobacco. She reports that she does not currently use alcohol after a past usage of about 4.0 standard drinks of alcohol per week. She reports that she does not currently use drugs after having used the following drugs: Marijuana.  Family History: family history includes Asthma in her brother and mother; Colon cancer in her maternal grandfather; Diabetes in her mother; Hypertension in her mother.   Review of Systems: A full review of systems was performed and negative except as noted in the HPI.     Physical Exam:  Vital Signs: BP 133/84   Pulse 74   Temp 98.2 F (36.8 C) (Oral)   Resp 20   LMP 09/02/2022 (Exact Date)   General: no acute distress.  HEENT: normocephalic, atraumatic Heart: regular rate & rhythm Lungs: normal respiratory effort Abdomen: soft, gravid, non-tender;  EFW: 7 1/2 lbs  Pelvic:   External: Normal external female genitalia  Cervix: Dilation: 5 / Effacement (%): 90 / Station: 0    Extremities: non-tender, symmetric, no edema bilaterally.   Neurologic: Alert & oriented x 3.    Results for orders placed or performed during the hospital encounter of 06/17/23 (from the past 24 hours)  CBC     Status: Abnormal   Collection Time: 06/17/23  3:18 AM  Result Value Ref Range   WBC 8.6 4.0 - 10.5 K/uL   RBC 4.85 3.87 - 5.11 MIL/uL   Hemoglobin 12.8 12.0 - 15.0 g/dL   HCT 57.8 46.9 - 62.9 %   MCV 75.7 (L) 80.0 - 100.0 fL   MCH 26.4 26.0 - 34.0 pg   MCHC 34.9 30.0 - 36.0 g/dL   RDW 52.8 (H) 41.3 - 24.4 %   Platelets 297 150 - 400 K/uL   nRBC 0.0 0.0 - 0.2 %  Urine Drug Screen, Qualitative ([facility] only)     Status: None   Collection Time: 06/17/23  3:29 AM  Result Value Ref Range   Tricyclic, Ur Screen NONE DETECTED NONE DETECTED   Amphetamines, Ur Screen NONE DETECTED NONE DETECTED   MDMA (Ecstasy)Ur Screen NONE DETECTED NONE DETECTED   Cocaine Metabolite,Ur [place] NONE  DETECTED NONE DETECTED   Opiate, Ur Screen NONE DETECTED NONE DETECTED   Phencyclidine (PCP) Ur S NONE DETECTED NONE DETECTED   Cannabinoid 50 Ng, Ur [place] NONE DETECTED NONE DETECTED   Barbiturates, Ur Screen NONE DETECTED NONE DETECTED   Benzodiazepine, Ur Scrn NONE DETECTED NONE DETECTED   Methadone Scn, Ur NONE DETECTED  NONE DETECTED    Pertinent Results:  Prenatal Labs: Blood type/Rh A POS Performed at [facility], [address] [place], [place] [address]    Antibody screen Negative    Rubella Immune  10/16/2022  Varicella Immune 07/08/2018  RPR NR 10/26/2022    HBsAg Neg 12/01/2021   Hep C NR 04/21/2022  HIV Neg 01/18/2023    GC neg  Chlamydia neg  Genetic screening Unknown   1 hour GTT 126  3 hour GTT N/A  GBS Neg 05/14/2023     FHT:  FHR: 140 bpm, variability: moderate,  accelerations:  Present,  decelerations:  Present intermittent variables  Category/reactivity:  Category II UC:   regular, every 3-5 minutes   Cephalic by [place] and SVE   No results found.  Assessment:  Katelyn Mendoza is a 33 y.o. [phone] female at [redacted]w[redacted]d with normal labor.   Plan:  1. Admit to Labor & Delivery - Admission status: Inpatient - Reason for admission: labor management - consents reviewed and obtained - Prenatal labs obtained from [facility] - will request records from [facility] in AM   2. Fetal Well being  - Fetal Tracing: Category II - overall reassuring with moderate variability and accels  - Group B Streptococcus ppx not indicated: GBS negative - Presentation: cephalic confirmed by SVE    3. Routine OB: - Prenatal labs reviewed, as above - Rh positive - CBC, T&S, RPR on admit - Clear liquid diet , continuous IV fluids  4. Monitoring of labor  - Contractions monitored with external toco - Pelvis proven to 3030 adequate for trial of labor  - Plan for expectant management  - Augmentation with AROM as appropriate  - Plan for  continuous fetal monitoring -  Maternal pain control as desired; planning regional anesthesia - Anticipate vaginal delivery  5. History of HSV - Previously took suppresion therapy with Valtrex  but stopped because she "did not like the way it made me feel" - Denies symptoms of HSV infection  - Pelvic exam performed - no evidence of HSV lesions noted during exam   6. Post Partum Planning: - Infant feeding: formula feeding - Contraception: TBD - Flu vaccine: Not given  - Tdap vaccine: Given prenatally - RSV vaccine: not in season   Teodora Fell, [place] 06/17/23 4:03 AM  Fraser Jackson, CNM Certified Nurse Midwife [facility]

## 2023-06-17 NOTE — Anesthesia Preprocedure Evaluation (Signed)
 Anesthesia Evaluation  Patient identified by MRN, date of birth, ID band Patient awake    Reviewed: Allergy & Precautions, NPO status , Patient's Chart, lab work & pertinent test results  History of Anesthesia Complications (+) POST - OP SPINAL HEADACHE and history of anesthetic complications  Airway Mallampati: II   Neck ROM: Full    Dental   Pulmonary former smoker (quit 2015)   Pulmonary exam normal breath sounds clear to auscultation       Cardiovascular Exercise Tolerance: Good negative cardio ROS Normal cardiovascular exam Rhythm:Regular Rate:Normal     Neuro/Psych  PSYCHIATRIC DISORDERS   Bipolar Disorder   negative neurological ROS     GI/Hepatic negative GI ROS,,,  Endo/Other  Obesity   Renal/GU negative Renal ROS     Musculoskeletal   Abdominal   Peds  Hematology  (+) Blood dyscrasia, Sickle cell trait   Anesthesia Other Findings 33 yo [id number] at 38 1/7 requesting labor epidural.  Reproductive/Obstetrics (+) Pregnancy                             Anesthesia Physical Anesthesia Plan  ASA: 2  Anesthesia Plan: Epidural   Post-op Pain Management:    Induction:   PONV Risk Score and Plan: 2 and Treatment may vary due to age or medical condition  Airway Management Planned: Natural Airway  Additional Equipment:   Intra-op Plan:   Post-operative Plan:   Informed Consent: I have reviewed the patients History and Physical, chart, labs and discussed the procedure including the risks, benefits and alternatives for the proposed anesthesia with the patient or authorized representative who has indicated his/her understanding and acceptance.     Dental Advisory Given  Plan Discussed with:   Anesthesia Plan Comments: (Patient reports no bleeding problems and no anticoagulant use.   Patient consented for risks of anesthesia including but not limited to:  - adverse reactions  to medications - risk of bleeding, infection and or nerve damage from epidural that could lead to paralysis - risk of headache or failed epidural - nerve damage due to positioning - that if epidural is used for C-section that there is a chance of epidural failure requiring spinal placement or conversion to GA - damage to heart, brain, lungs, other parts of body or loss of life  Patient voiced understanding and assent.)       Anesthesia Quick Evaluation

## 2023-06-18 LAB — CBC
HCT: 34.7 % — ABNORMAL LOW (ref 36.0–46.0)
Hemoglobin: 11.6 g/dL — ABNORMAL LOW (ref 12.0–15.0)
MCH: 26.1 pg (ref 26.0–34.0)
MCHC: 33.4 g/dL (ref 30.0–36.0)
MCV: 78 fL — ABNORMAL LOW (ref 80.0–100.0)
Platelets: 277 10*3/uL (ref 150–400)
RBC: 4.45 MIL/uL (ref 3.87–5.11)
RDW: 16.4 % — ABNORMAL HIGH (ref 11.5–15.5)
WBC: 8.8 10*3/uL (ref 4.0–10.5)
nRBC: 0 % (ref 0.0–0.2)

## 2023-06-18 NOTE — Anesthesia Postprocedure Evaluation (Signed)
 Anesthesia Post Note  Patient: Katelyn Mendoza  Procedure(s) Performed: AN AD HOC LABOR EPIDURAL  Patient location during evaluation: Mother Baby Anesthesia Type: Epidural Level of consciousness: awake and alert Pain management: pain level controlled Vital Signs Assessment: post-procedure vital signs reviewed and stable Respiratory status: spontaneous breathing, nonlabored ventilation and respiratory function stable Cardiovascular status: stable Postop Assessment: no headache, no backache and epidural receding Anesthetic complications: no   No notable events documented.   Last Vitals:  Vitals:   06/18/23 0403 06/18/23 0735  BP: 131/83 117/74  Pulse: 80 81  Resp: 20 19  Temp: 36.5 C 36.6 C  SpO2: 99% 99%    Last Pain:  Vitals:   06/18/23 0735  TempSrc: Oral  PainSc: 4                  Nathanie Ottley Daril Edge

## 2023-06-18 NOTE — Progress Notes (Signed)
 Patient discharged. Discharge instructions given. Patient verbalizes understanding. Transported by axillary.

## 2023-09-23 ENCOUNTER — Emergency Department

## 2023-09-23 ENCOUNTER — Other Ambulatory Visit: Payer: Self-pay

## 2023-09-23 DIAGNOSIS — R509 Fever, unspecified: Secondary | ICD-10-CM | POA: Insufficient documentation

## 2023-09-23 DIAGNOSIS — N939 Abnormal uterine and vaginal bleeding, unspecified: Secondary | ICD-10-CM | POA: Diagnosis present

## 2023-09-23 LAB — URINALYSIS, ROUTINE W REFLEX MICROSCOPIC
Bilirubin Urine: NEGATIVE
Glucose, UA: NEGATIVE mg/dL
Ketones, ur: NEGATIVE mg/dL
Leukocytes,Ua: NEGATIVE
Nitrite: NEGATIVE
Protein, ur: 30 mg/dL — AB
RBC / HPF: >50 RBC/hpf (ref 0–5)
Specific Gravity, Urine: 1.015 (ref 1.005–1.030)
Squamous Epithelial / HPF: 0 /HPF (ref 0–5)
pH: 6 (ref 5.0–8.0)

## 2023-09-23 LAB — CBC WITH DIFFERENTIAL/PLATELET
Abs Immature Granulocytes: 0.02 K/uL (ref 0.00–0.07)
Basophils Absolute: 0 K/uL (ref 0.0–0.1)
Basophils Relative: 0 %
Eosinophils Absolute: 0.2 K/uL (ref 0.0–0.5)
Eosinophils Relative: 2 %
HCT: 36.9 % (ref 36.0–46.0)
Hemoglobin: 12.4 g/dL (ref 12.0–15.0)
Immature Granulocytes: 0 %
Lymphocytes Relative: 33 %
Lymphs Abs: 2.6 K/uL (ref 0.7–4.0)
MCH: 26.5 pg (ref 26.0–34.0)
MCHC: 33.6 g/dL (ref 30.0–36.0)
MCV: 78.8 fL — ABNORMAL LOW (ref 80.0–100.0)
Monocytes Absolute: 0.5 K/uL (ref 0.1–1.0)
Monocytes Relative: 7 %
Neutro Abs: 4.4 K/uL (ref 1.7–7.7)
Neutrophils Relative %: 58 %
Platelets: 378 K/uL (ref 150–400)
RBC: 4.68 MIL/uL (ref 3.87–5.11)
RDW: 13.7 % (ref 11.5–15.5)
WBC: 7.7 K/uL (ref 4.0–10.5)
nRBC: 0 % (ref 0.0–0.2)

## 2023-09-23 LAB — BASIC METABOLIC PANEL WITH GFR
Anion gap: 12 (ref 5–15)
BUN: 18 mg/dL (ref 6–20)
CO2: 22 mmol/L (ref 22–32)
Calcium: 9.3 mg/dL (ref 8.9–10.3)
Chloride: 101 mmol/L (ref 98–111)
Creatinine, Ser: 0.72 mg/dL (ref 0.44–1.00)
GFR, Estimated: >60 mL/min (ref >60)
Glucose, Bld: 85 mg/dL (ref 70–99)
Potassium: 3.7 mmol/L (ref 3.5–5.1)
Sodium: 135 mmol/L (ref 135–145)

## 2023-09-23 LAB — HCG, QUANTITATIVE, PREGNANCY: hCG, Beta Chain, Quant, S: <1 m[IU]/mL (ref <5)

## 2023-09-23 NOTE — ED Triage Notes (Signed)
 Pt reports vaginal bleeding since yesterday, pt reports she had vaginal delivery aprox 3 months ago no complications. Pt reports lower abd cramping and lower back pain.

## 2023-09-24 ENCOUNTER — Emergency Department
Admission: EM | Admit: 2023-09-24 | Discharge: 2023-09-24 | Disposition: A | Discharge status: Home / Self Care | Attending: Emergency Medicine | Admitting: Emergency Medicine

## 2023-09-24 DIAGNOSIS — N939 Abnormal uterine and vaginal bleeding, unspecified: Secondary | ICD-10-CM

## 2023-09-24 MED ORDER — KETOROLAC TROMETHAMINE 30 MG/ML IJ SOLN
60.0000 mg | Freq: Once | INTRAMUSCULAR | Status: AC
  Administered 2023-09-24: 60 mg via INTRAMUSCULAR
  Filled 2023-09-24: qty 2

## 2023-09-24 MED ORDER — TRANEXAMIC ACID 650 MG PO TABS
1300.0000 mg | ORAL_TABLET | Freq: Once | ORAL | Status: AC
  Administered 2023-09-24: 1300 mg via ORAL
  Filled 2023-09-24: qty 2

## 2023-09-24 MED ORDER — TRANEXAMIC ACID 650 MG PO TABS: 1300.0000 mg | ORAL_TABLET | Freq: Three times a day (TID) | ORAL | 0 refills | Status: AC

## 2023-09-24 MED ORDER — IBUPROFEN 800 MG PO TABS: 800.0000 mg | ORAL_TABLET | Freq: Three times a day (TID) | ORAL | 0 refills | Status: AC | PRN

## 2023-09-24 NOTE — ED Provider Notes (Signed)
 [facility] Provider Note    Event Date/Time   First MD Initiated Contact with Patient 09/24/23 0004     (approximate)   History   Vaginal Bleeding   HPI  Katelyn Mendoza is a 33 y.o. female with history of bipolar disorder who presents to the emergency department vaginal bleeding.  Patient is a [id number] who states she just had a baby 3 months ago on 06/17/2023 by SVD at [redacted]w[redacted]d who presents to the emergency department with bleeding that started today.  States she was soaking through more than a pad an hour for more than 2 straight hours.  Having some abdominal cramping.  Bleeding has improved.  Not on blood thinners.  No discharge, odor.  No dysuria.  No fever.  Has not had a menstrual cycle since her delivery.  Has an OB/GYN for follow-up.  Denies lightheadedness, chest pain or shortness of breath.   History provided by patient.    Past Medical History:  Diagnosis Date   Gallstones    GC (gonococcus infection) 06/06/2020   05/2020 treated.  TOC 06/06/2020  Treated 07/24/20     HSV (herpes simplex virus) infection    Mental disorder    bipolar disorder dx'd at age 64   Sickle cell trait (HCC)    Spinal headache    Trichomonosis    Urinary tract infection affecting care of mother, antepartum     Past Surgical History:  Procedure Laterality Date   CHOLECYSTECTOMY  05/2019   spontaneous vaginal deliver     twins 12/20   WISDOM TOOTH EXTRACTION      MEDICATIONS:  Prior to Admission medications   Medication Sig Start Date End Date Taking? Authorizing Provider  Prenatal Vit-Fe Fumarate-FA (WESTAB PLUS) 27-1 MG TABS Take 1 tablet by mouth daily. 08/24/22   [provider]    Physical Exam   Triage Vital Signs: ED Triage Vitals  Encounter Vitals Group     BP 09/23/23 2025 115/89     Girls Systolic BP Percentile --      Girls Diastolic BP Percentile --      Boys Systolic BP Percentile --      Boys Diastolic BP Percentile --      Pulse Rate  09/23/23 2025 75     Resp 09/23/23 2025 18     Temp 09/23/23 2025 98.4 F (36.9 C)     Temp src --      SpO2 09/23/23 2025 97 %     Weight 09/23/23 2024 177 lb (80.3 kg)     Height 09/23/23 2024 5' 1 (1.549 m)     Head Circumference --      Peak Flow --      Pain Score 09/23/23 2024 10     Pain Loc --      Pain Education --      Exclude from Growth Chart --     Most recent vital signs: Vitals:   09/23/23 2025 09/24/23 0018  BP: 115/89 107/81  Pulse: 75 60  Resp: 18 16  Temp: 98.4 F (36.9 C) 97.9 F (36.6 C)  SpO2: 97% 100%    CONSTITUTIONAL: Alert, responds appropriately to questions. Well-appearing; well-nourished HEAD: Normocephalic, atraumatic EYES: Conjunctivae clear, pupils appear equal, sclera nonicteric ENT: normal nose; moist mucous membranes NECK: Supple, normal ROM CARD: RRR; S1 and S2 appreciated RESP: Normal chest excursion without splinting or tachypnea; breath sounds clear and equal bilaterally; no wheezes, no rhonchi, no rales, no  hypoxia or respiratory distress, speaking full sentences ABD/GI: Non-distended; soft, non-tender, no rebound, no guarding, no peritoneal signs BACK: The back appears normal EXT: Normal ROM in all joints; no deformity noted, no edema SKIN: Normal color for age and race; warm; no rash on exposed skin NEURO: Moves all extremities equally, normal speech PSYCH: The patient's mood and manner are appropriate.   ED Results / Procedures / Treatments   LABS: (all labs ordered are listed, but only abnormal results are displayed) Labs Reviewed  CBC WITH DIFFERENTIAL/PLATELET - Abnormal; Notable for the following components:      Result Value   MCV 78.8 (*)    All other components within normal limits  URINALYSIS, ROUTINE W REFLEX MICROSCOPIC - Abnormal; Notable for the following components:   Color, Urine AMBER (*)    APPearance HAZY (*)    Hgb urine dipstick LARGE (*)    Protein, ur 30 (*)    Bacteria, UA RARE (*)    All other  components within normal limits  BASIC METABOLIC PANEL WITH GFR  HCG, QUANTITATIVE, PREGNANCY  POC URINE PREG, ED     EKG:  RADIOLOGY: My personal review and interpretation of imaging: Ultrasound unremarkable.  I have personally reviewed all radiology reports.   US  PELVIC COMPLETE WITH TRANSVAGINAL Result Date: 09/24/2023 CLINICAL DATA:  Vaginal bleeding EXAM: TRANSABDOMINAL AND TRANSVAGINAL ULTRASOUND OF PELVIS TECHNIQUE: Both transabdominal and transvaginal ultrasound examinations of the pelvis were performed. Transabdominal technique was performed for global imaging of the pelvis including uterus, ovaries, adnexal regions, and pelvic cul-de-sac. It was necessary to proceed with endovaginal exam following the transabdominal exam to visualize the endometrium and ovaries. COMPARISON:  CT 06/11/2022 FINDINGS: Uterus Measurements: 9.2 x 4.9 x 5.5 cm = volume: 128 mL. No fibroids or other mass visualized. Endometrium Thickness: 7 mm in thickness.  No focal abnormality visualized. Right ovary Measurements: 2.7 x 2.0 x 2.7 cm = volume: 7 mL. Normal appearance/no adnexal mass. Left ovary Measurements: 3.0 x 1.9 x 1.6 cm = volume: 5 mL. Normal appearance/no adnexal mass. Other findings No abnormal free fluid. IMPRESSION: Normal study. Electronically Signed   By: Franky Crease M.D.   On: 09/24/2023 00:18     PROCEDURES:  Critical Care performed: No    Procedures    IMPRESSION / MDM / ASSESSMENT AND PLAN / ED COURSE  I reviewed the triage vital signs and the nursing notes.    Patient here with heavy vaginal bleeding 3 months after delivery of her son.    DIFFERENTIAL DIAGNOSIS (includes but not limited to):   Dysfunctional uterine bleeding, first menstrual cycle after recent delivery, anemia, new pregnancy, miscarriage, very low suspicion for retained products of conception, doubt endometritis, ovarian cyst, ovarian torsion, ruptured ectopic pregnancy, appendicitis given benign  exam   Patient's presentation is most consistent with acute presentation with potential threat to life or bodily function.   PLAN: Workup initiated from triage.  Hemoglobin of 12.4.  Normal platelets.  Normal electrolytes.  Urine shows large amount of blood from her vaginal bleeding.  She is not having any urinary symptoms.  Pregnancy test negative.  Ultrasound pending.  Will give Toradol , lysteda  for symptomatic relief.   MEDICATIONS GIVEN IN ED: Medications  ketorolac  (TORADOL ) 30 MG/ML injection 60 mg (60 mg Intramuscular Given 09/24/23 0056)  tranexamic acid  (LYSTEDA ) tablet 1,300 mg (1,300 mg Oral Given 09/24/23 0057)     ED COURSE: Patient reports feeling better.  Ultrasound reviewed and interpreted by myself and the radiologist is  unremarkable.  Will discharge with lysteda  3 times daily for the next 5 days.  Recommend Tylenol , Motrin  as needed.  She will follow-up with her OB/GYN.  Discussed bleeding return precautions.   At this time, I do not feel there is any life-threatening condition present. I reviewed all nursing notes, vitals, pertinent previous records.  All lab and urine results, EKGs, imaging ordered have been independently reviewed and interpreted by myself.  I reviewed all available radiology reports from any imaging ordered this visit.  Based on my assessment, I feel the patient is safe to be discharged home without further emergent workup and can continue workup as an outpatient as needed. Discussed all findings, treatment plan as well as usual and customary return precautions.  They verbalize understanding and are comfortable with this plan.  Outpatient follow-up has been provided as needed.  All questions have been answered.   CONSULTS:  none   OUTSIDE RECORDS REVIEWED: Reviewed recent OB/GYN notes.       FINAL CLINICAL IMPRESSION(S) / ED DIAGNOSES   Final diagnoses:  Vaginal bleeding     Rx / DC Orders   ED Discharge Orders          Ordered     tranexamic acid  (LYSTEDA ) 650 MG TABS tablet  3 times daily        09/24/23 0106    ibuprofen  (ADVIL ) 800 MG tablet  Every 8 hours PRN        09/24/23 0106             Note:  This document was prepared using Dragon voice recognition software and may include unintentional dictation errors.   Britlyn Martine, Josette SAILOR, DO 09/24/23 [phone]

## 2023-09-24 NOTE — Discharge Instructions (Signed)
 You may alternate over the counter Tylenol  1000 mg every 6 hours as needed for pain, fever and Ibuprofen  800 mg every 6-8 hours as needed for pain, fever.  Please take Ibuprofen  with food.  Do not take more than 4000 mg of Tylenol  (acetaminophen ) in a 24 hour period.  Please call and make an appointment with your OB/GYN for close outpatient follow-up.

## 2023-10-26 ENCOUNTER — Emergency Department

## 2023-10-26 ENCOUNTER — Other Ambulatory Visit: Payer: Self-pay

## 2023-10-26 DIAGNOSIS — O209 Hemorrhage in early pregnancy, unspecified: Secondary | ICD-10-CM | POA: Diagnosis present

## 2023-10-26 DIAGNOSIS — O200 Threatened abortion: Secondary | ICD-10-CM | POA: Diagnosis not present

## 2023-10-26 DIAGNOSIS — Z3A01 Less than 8 weeks gestation of pregnancy: Secondary | ICD-10-CM | POA: Diagnosis not present

## 2023-10-26 DIAGNOSIS — O3680X0 Pregnancy with inconclusive fetal viability, not applicable or unspecified: Secondary | ICD-10-CM | POA: Insufficient documentation

## 2023-10-26 LAB — CBC WITH DIFFERENTIAL/PLATELET
Abs Immature Granulocytes: 0.02 K/uL (ref 0.00–0.07)
Basophils Absolute: 0.1 K/uL (ref 0.0–0.1)
Basophils Relative: 1 %
Eosinophils Absolute: 0.2 K/uL (ref 0.0–0.5)
Eosinophils Relative: 2 %
HCT: 39.2 % (ref 36.0–46.0)
Hemoglobin: 12.7 g/dL (ref 12.0–15.0)
Immature Granulocytes: 0 %
Lymphocytes Relative: 30 %
Lymphs Abs: 2.9 K/uL (ref 0.7–4.0)
MCH: 27.3 pg (ref 26.0–34.0)
MCHC: 32.4 g/dL (ref 30.0–36.0)
MCV: 84.3 fL (ref 80.0–100.0)
Monocytes Absolute: 0.5 K/uL (ref 0.1–1.0)
Monocytes Relative: 6 %
Neutro Abs: 6.1 K/uL (ref 1.7–7.7)
Neutrophils Relative %: 61 %
Platelets: 393 K/uL (ref 150–400)
RBC: 4.65 MIL/uL (ref 3.87–5.11)
RDW: 13.5 % (ref 11.5–15.5)
WBC: 9.8 K/uL (ref 4.0–10.5)
nRBC: 0 % (ref 0.0–0.2)

## 2023-10-26 LAB — BASIC METABOLIC PANEL WITH GFR
Anion gap: 8 (ref 5–15)
BUN: 15 mg/dL (ref 6–20)
CO2: 25 mmol/L (ref 22–32)
Calcium: 9.1 mg/dL (ref 8.9–10.3)
Chloride: 105 mmol/L (ref 98–111)
Creatinine, Ser: 0.78 mg/dL (ref 0.44–1.00)
GFR, Estimated: >60 mL/min (ref >60)
Glucose, Bld: 118 mg/dL — ABNORMAL HIGH (ref 70–99)
Potassium: 3.7 mmol/L (ref 3.5–5.1)
Sodium: 138 mmol/L (ref 135–145)

## 2023-10-26 LAB — URINALYSIS, ROUTINE W REFLEX MICROSCOPIC
Bilirubin Urine: NEGATIVE
Glucose, UA: NEGATIVE mg/dL
Ketones, ur: NEGATIVE mg/dL
Nitrite: NEGATIVE
Protein, ur: NEGATIVE mg/dL
Specific Gravity, Urine: 1.015 (ref 1.005–1.030)
pH: 5 (ref 5.0–8.0)

## 2023-10-26 LAB — HCG, QUANTITATIVE, PREGNANCY: hCG, Beta Chain, Quant, S: 138 m[IU]/mL — ABNORMAL HIGH (ref <5)

## 2023-10-26 LAB — POC URINE PREG, ED: Preg Test, Ur: POSITIVE — AB

## 2023-10-26 NOTE — ED Triage Notes (Signed)
 Pt reports some vaginal spotting that began tonight, pt had + pregnancy test last week. Has not seen OB. P4G4. Pt just had a baby on 6/5

## 2023-10-27 ENCOUNTER — Emergency Department
Admission: EM | Admit: 2023-10-27 | Discharge: 2023-10-27 | Disposition: A | Discharge status: Home / Self Care | Attending: Emergency Medicine | Admitting: Emergency Medicine

## 2023-10-27 DIAGNOSIS — O4690 Antepartum hemorrhage, unspecified, unspecified trimester: Secondary | ICD-10-CM

## 2023-10-27 DIAGNOSIS — O200 Threatened abortion: Secondary | ICD-10-CM

## 2023-10-27 MED ORDER — FOSFOMYCIN TROMETHAMINE 3 G PO PACK
3.0000 g | PACK | Freq: Once | ORAL | Status: AC
  Administered 2023-10-27: 3 g via ORAL
  Filled 2023-10-27: qty 3

## 2023-10-27 NOTE — Discharge Instructions (Addendum)
 You have been seen in the Emergency Department (ED) for vaginal bleeding during pregnancy, which is called a "threatened miscarriage" or threatened abortion.  However, the blood work and ultrasound cannot say for sure that this pregnancy is going to develop into a normal fetus.  It may be too early to know for sure, or it may be that the pregnancy is not going to develop.  You need to follow-up as an outpatient for a repeat blood test called a beta hCG and a repeat ultrasound in order to determine if this is a pregnancy that will never develop, a miscarriage, or a fetus that will develop normally.  Please follow up as recommended above.  If you develop any other symptoms that concern you (including, but not limited to, persistent vomiting, worsening bleeding, abdominal or pelvic pain, or fever greater than 101), please return immediately to the Emergency Department.

## 2023-10-27 NOTE — ED Provider Notes (Signed)
 [facility] Provider Note    Event Date/Time   First MD Initiated Contact with Patient 10/27/23 0150     (approximate)   History   Vaginal Bleeding   HPI Katelyn Mendoza is a 33 y.o. female G5, P4(1 twin gestation), Ab1 who presents for evaluation of a positive pregnancy test last week and some vaginal spotting.  The spotting has been very light and she only noticed it when she was wiping after urinating.  She had her last baby about 4 months ago and was somewhat surprised when she tested positive at home with pregnancy test last week.  She is not having any pain or cramping.  No fever or chills.  Bleeding is relatively minimal and she has not required wearing a pad.  She has not had any recent dysuria or increased urinary frequency.     Physical Exam   Triage Vital Signs: ED Triage Vitals  Encounter Vitals Group     BP 10/26/23 2305 126/75     Girls Systolic BP Percentile --      Girls Diastolic BP Percentile --      Boys Systolic BP Percentile --      Boys Diastolic BP Percentile --      Pulse Rate 10/26/23 2305 72     Resp 10/26/23 2305 17     Temp 10/26/23 2305 98.4 F (36.9 C)     Temp src --      SpO2 10/26/23 2305 99 %     Weight 10/26/23 2303 83.9 kg (185 lb)     Height 10/26/23 2303 1.549 m (5' 1)     Head Circumference --      Peak Flow --      Pain Score 10/26/23 2303 0     Pain Loc --      Pain Education --      Exclude from Growth Chart --     Most recent vital signs: Vitals:   10/26/23 2305  BP: 126/75  Pulse: 72  Resp: 17  Temp: 98.4 F (36.9 C)  SpO2: 99%    General: Awake, no distress.  CV:  Good peripheral perfusion.  Resp:  Normal effort. Speaking easily and comfortably, no accessory muscle usage nor intercostal retractions.   Abd:  No distention.  No tenderness to palpation of the abdomen Other:  Deferred GU exam   ED Results / Procedures / Treatments   Labs (all labs ordered are listed, but only abnormal  results are displayed) Labs Reviewed  BASIC METABOLIC PANEL WITH GFR - Abnormal; Notable for the following components:      Result Value   Glucose, Bld 118 (*)    All other components within normal limits  HCG, QUANTITATIVE, PREGNANCY - Abnormal; Notable for the following components:   hCG, Beta Chain, Quant, S 138 (*)    All other components within normal limits  URINALYSIS, ROUTINE W REFLEX MICROSCOPIC - Abnormal; Notable for the following components:   Color, Urine YELLOW (*)    APPearance CLOUDY (*)    Hgb urine dipstick LARGE (*)    Leukocytes,Ua MODERATE (*)    Bacteria, UA RARE (*)    All other components within normal limits  POC URINE PREG, ED - Abnormal; Notable for the following components:   Preg Test, Ur Positive (*)    All other components within normal limits  URINE CULTURE  CBC WITH DIFFERENTIAL/PLATELET     RADIOLOGY I I independently viewed and interpreted the  patient's OB ultrasound that I can see no evidence of gestational sac or intrauterine pregnancy.  Radiology confirmed no evidence of pregnancy at this time   PROCEDURES:  Critical Care performed: No  Procedures    IMPRESSION / MDM / ASSESSMENT AND PLAN / ED COURSE  I reviewed the triage vital signs and the nursing notes.                              Differential diagnosis includes, but is not limited to, early pregnancy, occult ectopic pregnancy, failed pregnancy  Patient's presentation is most consistent with acute presentation with potential threat to life or bodily function.  Labs/studies ordered: OB ultrasound, CBC with differential, BMP, urine pregnancy, urine culture, urinalysis, hCG  Interventions/Medications given:  Medications  fosfomycin (MONUROL) packet 3 g (3 g Oral Given 10/27/23 0323)    (Note:  hospital course my include additional interventions and/or labs/studies not listed above.)   Patient in no distress and hemodynamically stable.  hCG is only 138.  Indeterminate  ultrasound.  I had my usual and customary discussion about this sort of situation and the need for close outpatient follow-up.  She understands and agrees with the plan.  Of note she has had no urinary symptoms but has a urinalysis suggestive of UTI.  I sent a urine culture and will treat with a one-time dose of fosfomycin.  She agrees with the plan         FINAL CLINICAL IMPRESSION(S) / ED DIAGNOSES   Final diagnoses:  Vaginal bleeding in pregnancy  Threatened miscarriage     Rx / DC Orders   ED Discharge Orders     None        Note:  This document was prepared using Dragon voice recognition software and may include unintentional dictation errors.   Gordan Huxley, MD 10/27/23 [phone]

## 2023-10-28 LAB — URINE CULTURE: Culture: >=100000 — AB

## 2023-11-04 ENCOUNTER — Other Ambulatory Visit: Payer: Self-pay | Admitting: Family Medicine

## 2023-11-04 DIAGNOSIS — Z3481 Encounter for supervision of other normal pregnancy, first trimester: Secondary | ICD-10-CM

## 2023-11-09 ENCOUNTER — Ambulatory Visit
Admission: RE | Admit: 2023-11-09 | Discharge: 2023-11-09 | Disposition: A | Discharge status: Home / Self Care | Source: Ambulatory Visit | Attending: Family Medicine | Admitting: Family Medicine

## 2023-11-09 ENCOUNTER — Other Ambulatory Visit: Payer: Self-pay | Admitting: Family Medicine

## 2023-11-09 DIAGNOSIS — Z3481 Encounter for supervision of other normal pregnancy, first trimester: Secondary | ICD-10-CM

## 2023-11-09 DIAGNOSIS — Z3A01 Less than 8 weeks gestation of pregnancy: Secondary | ICD-10-CM | POA: Diagnosis not present

## 2023-11-11 ENCOUNTER — Other Ambulatory Visit: Payer: Self-pay | Admitting: Family Medicine

## 2023-11-11 DIAGNOSIS — Z3481 Encounter for supervision of other normal pregnancy, first trimester: Secondary | ICD-10-CM

## 2023-11-18 ENCOUNTER — Other Ambulatory Visit: Payer: Self-pay | Admitting: Family Medicine

## 2023-11-18 ENCOUNTER — Ambulatory Visit
Admission: RE | Admit: 2023-11-18 | Discharge: 2023-11-18 | Disposition: A | Discharge status: Home / Self Care | Source: Ambulatory Visit | Attending: Family Medicine | Admitting: Family Medicine

## 2023-11-18 DIAGNOSIS — Z3481 Encounter for supervision of other normal pregnancy, first trimester: Secondary | ICD-10-CM | POA: Diagnosis present

## 2023-11-18 DIAGNOSIS — Z3A01 Less than 8 weeks gestation of pregnancy: Secondary | ICD-10-CM | POA: Diagnosis not present

## 2023-11-18 DIAGNOSIS — N8311 Corpus luteum cyst of right ovary: Secondary | ICD-10-CM | POA: Diagnosis not present

## 2023-12-12 ENCOUNTER — Other Ambulatory Visit: Payer: Self-pay

## 2023-12-12 ENCOUNTER — Encounter: Payer: Self-pay | Admitting: Emergency Medicine

## 2023-12-12 ENCOUNTER — Emergency Department
Admission: EM | Admit: 2023-12-12 | Discharge: 2023-12-12 | Disposition: A | Discharge status: Home / Self Care | Attending: Emergency Medicine | Admitting: Emergency Medicine

## 2023-12-12 ENCOUNTER — Emergency Department

## 2023-12-12 DIAGNOSIS — O219 Vomiting of pregnancy, unspecified: Secondary | ICD-10-CM | POA: Insufficient documentation

## 2023-12-12 DIAGNOSIS — N898 Other specified noninflammatory disorders of vagina: Secondary | ICD-10-CM

## 2023-12-12 DIAGNOSIS — O2341 Unspecified infection of urinary tract in pregnancy, first trimester: Secondary | ICD-10-CM | POA: Insufficient documentation

## 2023-12-12 DIAGNOSIS — Z3490 Encounter for supervision of normal pregnancy, unspecified, unspecified trimester: Secondary | ICD-10-CM

## 2023-12-12 DIAGNOSIS — R112 Nausea with vomiting, unspecified: Secondary | ICD-10-CM

## 2023-12-12 DIAGNOSIS — Z3A11 11 weeks gestation of pregnancy: Secondary | ICD-10-CM | POA: Diagnosis not present

## 2023-12-12 DIAGNOSIS — R350 Frequency of micturition: Secondary | ICD-10-CM

## 2023-12-12 DIAGNOSIS — N390 Urinary tract infection, site not specified: Secondary | ICD-10-CM

## 2023-12-12 DIAGNOSIS — R509 Fever, unspecified: Secondary | ICD-10-CM

## 2023-12-12 LAB — RESP PANEL BY RT-PCR (RSV, FLU A&B, COVID)  RVPGX2
Influenza A by PCR: NEGATIVE
Influenza B by PCR: NEGATIVE
Resp Syncytial Virus by PCR: NEGATIVE
SARS Coronavirus 2 by RT PCR: NEGATIVE

## 2023-12-12 LAB — BASIC METABOLIC PANEL WITH GFR
Anion gap: 13 (ref 5–15)
BUN: 6 mg/dL (ref 6–20)
CO2: 21 mmol/L — ABNORMAL LOW (ref 22–32)
Calcium: 9.5 mg/dL (ref 8.9–10.3)
Chloride: 99 mmol/L (ref 98–111)
Creatinine, Ser: 0.57 mg/dL (ref 0.44–1.00)
GFR, Estimated: >60 mL/min (ref >60)
Glucose, Bld: 102 mg/dL — ABNORMAL HIGH (ref 70–99)
Potassium: 3.9 mmol/L (ref 3.5–5.1)
Sodium: 133 mmol/L — ABNORMAL LOW (ref 135–145)

## 2023-12-12 LAB — CHLAMYDIA/NGC RT PCR (ARMC ONLY)
Chlamydia Tr: NOT DETECTED
N gonorrhoeae: NOT DETECTED

## 2023-12-12 LAB — URINALYSIS, W/ REFLEX TO CULTURE (INFECTION SUSPECTED)
Bilirubin Urine: NEGATIVE
Glucose, UA: NEGATIVE mg/dL
Hgb urine dipstick: NEGATIVE
Ketones, ur: NEGATIVE mg/dL
Leukocytes,Ua: NEGATIVE
Nitrite: NEGATIVE
Protein, ur: NEGATIVE mg/dL
Specific Gravity, Urine: 1.016 (ref 1.005–1.030)
pH: 5 (ref 5.0–8.0)

## 2023-12-12 LAB — CBC
HCT: 38.4 % (ref 36.0–46.0)
Hemoglobin: 13.2 g/dL (ref 12.0–15.0)
MCH: 27.2 pg (ref 26.0–34.0)
MCHC: 34.4 g/dL (ref 30.0–36.0)
MCV: 79.2 fL — ABNORMAL LOW (ref 80.0–100.0)
Platelets: 316 K/uL (ref 150–400)
RBC: 4.85 MIL/uL (ref 3.87–5.11)
RDW: 13.1 % (ref 11.5–15.5)
WBC: 7.9 K/uL (ref 4.0–10.5)
nRBC: 0 % (ref 0.0–0.2)

## 2023-12-12 LAB — WET PREP, GENITAL
Clue Cells Wet Prep HPF POC: NONE SEEN
Sperm: NONE SEEN
Trich, Wet Prep: NONE SEEN
WBC, Wet Prep HPF POC: <10 (ref <10)
Yeast Wet Prep HPF POC: NONE SEEN

## 2023-12-12 LAB — HCG, QUANTITATIVE, PREGNANCY: hCG, Beta Chain, Quant, S: 84309 m[IU]/mL — ABNORMAL HIGH (ref <5)

## 2023-12-12 MED ORDER — ACETAMINOPHEN 500 MG PO TABS
1000.0000 mg | ORAL_TABLET | Freq: Once | ORAL | Status: AC
  Administered 2023-12-12: 1000 mg via ORAL
  Filled 2023-12-12: qty 2

## 2023-12-12 MED ORDER — SODIUM CHLORIDE 0.9 % IV BOLUS
1000.0000 mL | Freq: Once | INTRAVENOUS | Status: AC
  Administered 2023-12-12: 1000 mL via INTRAVENOUS

## 2023-12-12 MED ORDER — CEFDINIR 300 MG PO CAPS: 300.0000 mg | ORAL_CAPSULE | Freq: Two times a day (BID) | ORAL | 0 refills | Status: AC

## 2023-12-12 MED ORDER — METOCLOPRAMIDE HCL 10 MG PO TABS: 10.0000 mg | ORAL_TABLET | Freq: Three times a day (TID) | ORAL | 0 refills | Status: AC | PRN

## 2023-12-12 MED ORDER — SODIUM CHLORIDE 0.9 % IV SOLN
1.0000 g | Freq: Once | INTRAVENOUS | Status: AC
  Administered 2023-12-12: 1 g via INTRAVENOUS
  Filled 2023-12-12: qty 10

## 2023-12-12 NOTE — ED Provider Notes (Signed)
 [facility] Provider Note    Event Date/Time   First MD Initiated Contact with Patient 12/12/23 2018     (approximate)   History   Vaginal Bleeding, Fatigue, and Fever   HPI  Katelyn Mendoza is a 33 y.o. female with history of bipolar disorder, [redacted] weeks pregnant, presenting with vaginal bleeding.  States that vaginal spotting started in the morning.  Denies abdominal cramping.  Did note fever and nausea vomiting as well as increased fatigue today.  Denies sick contacts.  She denies any cough, no diarrhea.  No chest pain or shortness of breath.  Does note increased urinary frequency and right back aching.  States she noticed some white vaginal discharge.  Has had this discharge in the past and was positive for BV.  On independent chart review, she had ultrasound done in early November that showed a single IUP at 7 weeks 2 days gestation.  Also noted a 1.4 cm right ovarian corpus luteal cyst.  She is A positive.     Physical Exam   Triage Vital Signs: ED Triage Vitals  Encounter Vitals Group     BP 12/12/23 1835 110/71     Girls Systolic BP Percentile --      Girls Diastolic BP Percentile --      Boys Systolic BP Percentile --      Boys Diastolic BP Percentile --      Pulse Rate 12/12/23 1835 90     Resp 12/12/23 1835 18     Temp 12/12/23 1835 (!) 100.4 F (38 C)     Temp Source 12/12/23 1835 Oral     SpO2 12/12/23 1835 98 %     Weight 12/12/23 1833 175 lb (79.4 kg)     Height 12/12/23 1833 5' 1 (1.549 m)     Head Circumference --      Peak Flow --      Pain Score 12/12/23 1833 0     Pain Loc --      Pain Education --      Exclude from Growth Chart --     Most recent vital signs: Vitals:   12/12/23 1835 12/12/23 2043  BP: 110/71   Pulse: 90   Resp: 18   Temp: (!) 100.4 F (38 C)   SpO2: 98% 99%     General: Awake, no distress.  CV:  Good peripheral perfusion.  Resp:  Normal effort.  Abd:   Soft, gravid, nontender Other:  No flank  tenderness bilaterally, she does have right CVA tenderness   ED Results / Procedures / Treatments   Labs (all labs ordered are listed, but only abnormal results are displayed) Labs Reviewed  CBC - Abnormal; Notable for the following components:      Result Value   MCV 79.2 (*)    All other components within normal limits  BASIC METABOLIC PANEL WITH GFR - Abnormal; Notable for the following components:   Sodium 133 (*)    CO2 21 (*)    Glucose, Bld 102 (*)    All other components within normal limits  HCG, QUANTITATIVE, PREGNANCY - Abnormal; Notable for the following components:   hCG, Beta Chain, Quant, S 84,309 (*)    All other components within normal limits  URINALYSIS, W/ REFLEX TO CULTURE (INFECTION SUSPECTED) - Abnormal; Notable for the following components:   Color, Urine YELLOW (*)    APPearance HAZY (*)    Bacteria, UA MANY (*)  All other components within normal limits  RESP PANEL BY RT-PCR (RSV, FLU A&B, COVID)  [id number]  WET PREP, GENITAL  URINE CULTURE  CHLAMYDIA/NGC RT PCR ([facility] ONLY)               RADIOLOGY On my independent interpretation, ultrasound shows live intrauterine pregnancy   PROCEDURES:  Critical Care performed: No  Procedures   MEDICATIONS ORDERED IN ED: Medications  cefTRIAXone  (ROCEPHIN ) 1 g in sodium chloride  0.9 % 100 mL IVPB (1 g Intravenous New Bag/Given 12/12/23 2220)  acetaminophen  (TYLENOL ) tablet 1,000 mg (1,000 mg Oral Given 12/12/23 2047)  sodium chloride  0.9 % bolus 1,000 mL (1,000 mLs Intravenous New Bag/Given 12/12/23 2121)     IMPRESSION / MDM / ASSESSMENT AND PLAN / ED COURSE  I reviewed the triage vital signs and the nursing notes.                              Differential diagnosis includes, but is not limited to, viral illness, pyelonephritis, UTI, STI, BV, yeast infection, for her vaginal spotting considered potential miscarriage, subchorionic hemorrhage, normal bleeding during pregnancy.  Will get labs, OB  ultrasound, UA, wet prep, GC/chlamydia.  Will give her some Tylenol  here as well as some fluids.  Patient's presentation is most consistent with acute presentation with potential threat to life or bodily function.  Independent interpretation of labs and imaging below.  Ultrasound reassuring for live intrauterine pregnancy.  Clinical course as below.  Reassessment patient is feeling a lot better.  Will give her a dose of IV ceftriaxone  here and send antibiotics to her pharmacy.  Will also give her a short prescription for Reglan  for nausea.  Encouraged hydration.  Did discuss with her that the GC chlamydia results will likely result tomorrow.  She will get a call if it is positive or she can call tomorrow for the results.  Did discuss with her about close OB follow-up, she will call the clinic tomorrow to schedule follow-up this week.  Otherwise considered but no indication for inpatient admission at this time, she safe for outpatient management.  Will discharge with strict return precautions.  Shared decision making done with patient and she is agreeable with plan.    Clinical Course as of 12/12/23 2222  Sun Dec 12, 2023  2023 Independent review of labs, hCG is elevated, electrolytes not severely deranged, no leukocytosis, respiratory viral panel is negative. [TT]  2136 US  OB Comp Less 14 Wks IMPRESSION: 1. Single viable IUP, estimated gestational age [redacted] weeks and 6 days by crown-rump length, with ultrasound EDC of 07/03/2024. No complication. 2. No other acute maternal uterine or adnexal abnormality.   [TT]  2216 Her wet prep is negative, UA shows many bacteria, it is a dirty catch but given her urinary frequency and CVA tenderness, will treat her for pyelonephritis.  Will start her on antibiotics, first dose given in the emergency department. [TT]    Clinical Course User Index [TT] Waymond Lorelle Cummins, MD     FINAL CLINICAL IMPRESSION(S) / ED DIAGNOSES   Final diagnoses:  Fever, unspecified  fever cause  Pregnancy, unspecified gestational age  Urinary tract infection without hematuria, site unspecified  Urinary frequency  Vaginal discharge  Nausea and vomiting, unspecified vomiting type     Rx / DC Orders   ED Discharge Orders          Ordered    metoCLOPramide  (REGLAN ) 10 MG tablet  Every 8 hours PRN        12/12/23 2216    cefdinir (OMNICEF) 300 MG capsule  2 times daily        12/12/23 2216             Note:  This document was prepared using Dragon voice recognition software and may include unintentional dictation errors.    Waymond Lorelle Cummins, MD 12/12/23 2222

## 2023-12-12 NOTE — Discharge Instructions (Signed)
 Please make sure to call your OB/GYN on Monday to schedule follow-up to get reassessed.  Please take antibiotics as prescribed for UTI.  I have also sent some antinausea medication to your pharmacy.

## 2023-12-12 NOTE — ED Triage Notes (Signed)
 Pt via POV from home. Pt c/o vaginal bleeding that started this AM, reports she is [redacted] weeks pregnant. LMP 9/9. Pregnancy has been confirmed by a physician. Denies any abd cramping. Pt also c/o fatigue, fever, and NV for the past couple of days. Denies any sick contacts. Pt is A&Ox4 and NAD, ambulatory to triage.

## 2023-12-13 LAB — URINE CULTURE

## 2024-01-20 ENCOUNTER — Emergency Department: Admission: EM | Admit: 2024-01-20 | Discharge: 2024-01-20 | Discharge status: Against Medical Advice
